# Patient Record
Sex: Male | Born: 1950 | Race: White | Hispanic: No | Marital: Married | Smoking: Former smoker
Health system: Southern US, Community
[De-identification: ages and names within clinical notes are randomized; demographics above are authoritative.]

## PROBLEM LIST (undated history)

## (undated) DIAGNOSIS — E7800 Pure hypercholesterolemia, unspecified: Secondary | ICD-10-CM

## (undated) DIAGNOSIS — K219 Gastro-esophageal reflux disease without esophagitis: Secondary | ICD-10-CM

## (undated) DIAGNOSIS — I2510 Atherosclerotic heart disease of native coronary artery without angina pectoris: Secondary | ICD-10-CM

## (undated) DIAGNOSIS — M109 Gout, unspecified: Secondary | ICD-10-CM

## (undated) DIAGNOSIS — E119 Type 2 diabetes mellitus without complications: Secondary | ICD-10-CM

## (undated) DIAGNOSIS — N289 Disorder of kidney and ureter, unspecified: Secondary | ICD-10-CM

## (undated) DIAGNOSIS — M1990 Unspecified osteoarthritis, unspecified site: Secondary | ICD-10-CM

## (undated) DIAGNOSIS — I219 Acute myocardial infarction, unspecified: Secondary | ICD-10-CM

## (undated) DIAGNOSIS — I10 Essential (primary) hypertension: Secondary | ICD-10-CM

## (undated) HISTORY — PX: HERNIA REPAIR: SHX51

## (undated) HISTORY — PX: CORONARY STENT PLACEMENT: SHX1402

---

## 2001-10-14 ENCOUNTER — Encounter: Payer: Self-pay | Admitting: Internal Medicine

## 2001-10-14 ENCOUNTER — Ambulatory Visit (HOSPITAL_COMMUNITY): Admission: RE | Admit: 2001-10-14 | Discharge: 2001-10-14 | Payer: Self-pay | Admitting: Internal Medicine

## 2006-04-06 ENCOUNTER — Emergency Department (HOSPITAL_COMMUNITY): Admission: EM | Admit: 2006-04-06 | Discharge: 2006-04-06 | Payer: Self-pay | Admitting: Emergency Medicine

## 2006-07-26 ENCOUNTER — Emergency Department (HOSPITAL_COMMUNITY): Admission: EM | Admit: 2006-07-26 | Discharge: 2006-07-26 | Payer: Self-pay | Admitting: Emergency Medicine

## 2006-12-05 ENCOUNTER — Ambulatory Visit (HOSPITAL_COMMUNITY): Admission: RE | Admit: 2006-12-05 | Discharge: 2006-12-05 | Payer: Self-pay | Admitting: General Surgery

## 2006-12-05 ENCOUNTER — Encounter (INDEPENDENT_AMBULATORY_CARE_PROVIDER_SITE_OTHER): Payer: Self-pay | Admitting: General Surgery

## 2007-10-20 ENCOUNTER — Ambulatory Visit (HOSPITAL_COMMUNITY): Admission: RE | Admit: 2007-10-20 | Discharge: 2007-10-20 | Payer: Self-pay | Admitting: Family Medicine

## 2008-05-31 ENCOUNTER — Ambulatory Visit (HOSPITAL_COMMUNITY): Admission: RE | Admit: 2008-05-31 | Discharge: 2008-05-31 | Payer: Self-pay | Admitting: Cardiology

## 2008-06-03 ENCOUNTER — Inpatient Hospital Stay (HOSPITAL_COMMUNITY): Admission: RE | Admit: 2008-06-03 | Discharge: 2008-06-05 | Payer: Self-pay | Admitting: Cardiology

## 2008-06-29 ENCOUNTER — Inpatient Hospital Stay (HOSPITAL_COMMUNITY): Admission: RE | Admit: 2008-06-29 | Discharge: 2008-07-01 | Payer: Self-pay | Admitting: Cardiology

## 2010-05-30 LAB — BASIC METABOLIC PANEL
BUN: 15 mg/dL (ref 6–23)
CO2: 25 mEq/L (ref 19–32)
Calcium: 8.7 mg/dL (ref 8.4–10.5)
Chloride: 107 mEq/L (ref 96–112)
Creatinine, Ser: 1.32 mg/dL (ref 0.4–1.5)
GFR calc Af Amer: >60 mL/min (ref >60)
GFR calc non Af Amer: 56 mL/min — ABNORMAL LOW (ref >60)
Glucose, Bld: 175 mg/dL — ABNORMAL HIGH (ref 70–99)
Potassium: 3.8 mEq/L (ref 3.5–5.1)
Sodium: 139 mEq/L (ref 135–145)

## 2010-05-30 LAB — CBC
Hemoglobin: 13.8 g/dL (ref 13.0–17.0)
RBC: 4.43 MIL/uL (ref 4.22–5.81)
RDW: 13.8 % (ref 11.5–15.5)
WBC: 4.7 10*3/uL (ref 4.0–10.5)

## 2010-05-30 LAB — CARDIAC PANEL(CRET KIN+CKTOT+MB+TROPI)
CK, MB: 10.5 ng/mL — ABNORMAL HIGH (ref 0.3–4.0)
CK, MB: 9.2 ng/mL — ABNORMAL HIGH (ref 0.3–4.0)
Relative Index: 3.2 — ABNORMAL HIGH (ref 0.0–2.5)
Relative Index: 3.6 — ABNORMAL HIGH (ref 0.0–2.5)
Total CK: 248 U/L — ABNORMAL HIGH (ref 7–232)
Total CK: 253 U/L — ABNORMAL HIGH (ref 7–232)
Total CK: 333 U/L — ABNORMAL HIGH (ref 7–232)
Troponin I: 0.05 ng/mL (ref 0.00–0.06)

## 2010-05-30 LAB — TESTOSTERONE: Testosterone: 242.19 ng/dL — ABNORMAL LOW (ref 350–890)

## 2010-05-31 LAB — CARDIAC PANEL(CRET KIN+CKTOT+MB+TROPI)
CK, MB: 11.2 ng/mL — ABNORMAL HIGH (ref 0.3–4.0)
CK, MB: 13.8 ng/mL — ABNORMAL HIGH (ref 0.3–4.0)
Relative Index: 3 — ABNORMAL HIGH (ref 0.0–2.5)
Relative Index: 2.7 — ABNORMAL HIGH (ref 0.0–2.5)
Total CK: 403 U/L — ABNORMAL HIGH (ref 7–232)
Total CK: 426 U/L — ABNORMAL HIGH (ref 7–232)
Troponin I: 0.08 ng/mL — ABNORMAL HIGH (ref 0.00–0.06)

## 2010-05-31 LAB — BASIC METABOLIC PANEL
BUN: 16 mg/dL (ref 6–23)
BUN: 17 mg/dL (ref 6–23)
CO2: 26 mEq/L (ref 19–32)
CO2: 27 mEq/L (ref 19–32)
Calcium: 8.6 mg/dL (ref 8.4–10.5)
Calcium: 9.2 mg/dL (ref 8.4–10.5)
Creatinine, Ser: 1.23 mg/dL (ref 0.4–1.5)
Glucose, Bld: 107 mg/dL — ABNORMAL HIGH (ref 70–99)
Glucose, Bld: 127 mg/dL — ABNORMAL HIGH (ref 70–99)
Sodium: 138 mEq/L (ref 135–145)

## 2010-05-31 LAB — CBC
Hemoglobin: 14 g/dL (ref 13.0–17.0)
MCHC: 34.7 g/dL (ref 30.0–36.0)
RBC: 4.57 MIL/uL (ref 4.22–5.81)

## 2010-07-04 NOTE — Discharge Summary (Signed)
NAMEKERRI, BHATTI NO.:  [id number]   MEDICAL RECORD NO.:  [id number]          PATIENT TYPE:  INP   LOCATION:  2501                         FACILITY:  [facility]   PHYSICIAN:  Eden Lathe. Einar Gip, MD       DATE OF BIRTH:  26-Sep-1950   DATE OF ADMISSION:  06/03/2008  DATE OF DISCHARGE:  06/05/2008                               DISCHARGE SUMMARY   DISCHARGE DIAGNOSES:  1. Angina with multiple risk factors for coronary artery disease.  2. Coronary artery disease with multivessel stenosis including 99%      right coronary artery stenosis.  The patient underwent percutaneous      transluminal coronary angioplasty and drug-eluting stent x2 to the      right coronary artery, residual disease of 80% circumflex with      plans for intervention in the near future.  3. Normal ejection fraction.  4. Nonsustained ventricular tachycardia.  5. Mild elevation of enzymes after stent placed.  6. Positive tobacco use, counseled on stopping.  7. Tobacco chewing.   DISCHARGE CONDITION:  Improved.   PROCEDURES:  1. Combined left heart catheterization on June 03, 2008, by Dr. Adrian Prows.  2. On June 03, 2008, percutaneous transluminal coronary angioplasty      and stent deployment x2 stents in the right coronary artery by Dr.      Einar Gip reducing 99% stenosis to 0.   Plan to return to Cath Lab in the near future for intervention to the  circumflex.  It was attempted during this hospitalization, but stopped  secondary to back pain of the patient.   DISCHARGE CONDITION:  Improved.   DISCHARGE MEDICATIONS:  1. Lisinopril 10 mg daily.  2. Stop omeprazole, use Protonix 40 mg 1 daily at noon.  3. Simvastatin 40 mg daily.  4. Amlodipine 5 mg daily.  5. Take aspirin, enteric coated, 325 mg daily.  6. Plavix 75 mg 1 daily, do not stop, stopping could cause a heart      attack.  7. Lopressor 25 mg 1 twice a day.  8. Nitroglycerin 1/150 one under tongue as needed for chest pain while     sitting, 1 every 5 minutes to 3 every 15 minutes.  If pain      continues, go to the emergency room.   DISCHARGE INSTRUCTIONS:  1. Low-sodium, heart-healthy diet.  2. Wash cath site with soap and water.  Call if any bleeding,      swelling, or drainage.  Increase activity slowly.  May shower.  No      lifting for 2 days.  No driving for 2 days.  3. Follow up with Dr. Einar Gip on June 15, 2008, 11:30 a.m.   HISTORY OF PRESENT ILLNESS:  A 60 year old gentleman with long 25-pack  history of tobacco use, currently chewing tobacco, was having increasing  shortness of breath and dyspnea on exertion over 2-3 months with  exertional activities.  He also noted heaviness in the left upper chest  and he presented to see Dr. Einar Gip for followup.  He does have  occasional  gastroesophageal reflux disease with associated palpitations, shortness  of breath, and back pain occasionally.   ALLERGIES:  No known drug allergies.   FAMILY HISTORY, SOCIAL HISTORY, AND REVIEW OF SYSTEMS:  See H&P.   PHYSICAL EXAMINATION ON DISCHARGE:  VITAL SIGNS:  Blood pressure 139/87,  pulse 67, respirations 17, temperature 99.1, and oxygen saturation on  room air 97%.  HEART:  S1 and S2.  Regular rate and rhythm.  LUNGS:  Clear to auscultation.  ABDOMEN:  Soft and nontender.  Positive bowel sounds.  EXTREMITIES:  Right groin without hematoma.  Pulses are present.   LABORATORY DATA:  Sodium 140, potassium 3.8, BUN 17, creatinine 1.23,  and glucose 107.  Cardiac enzymes, initial postprocedure was 379 with MB  11.2 and troponin of 0.11, followup was 403 with MB of 13.8 and troponin  of 0.13, and third CK-MB was 426 with MB of 15.6 and troponin of 0.08.   HOSPITAL COURSE:  The patient underwent procedure and intervention to  his coronary artery disease electively as an outpatient, stayed  overnight and plans were for discharge home the next day after tobacco  cessation consult was done which it was as well as rehab but  with mild  elevation of CK-MB and troponin and some nonsustained ventricular  tachycardia 45 beats.  It was felt he should be kept 1 more night which  we did do.  Additionally, he was started on beta-blocker and Lopressor  25 b.i.d.  He will follow up with Dr. Einar Gip as an outpatient.  By the  morning of June 05, 2008, he was stable without complaints, ambulating  without problems and no further ventricular tachycardia.  He was seen by  Dr. Elisabeth Cara and discharged home and will follow up with Dr. Einar Gip.      Otilio Carpen. Ingold, N.P.      Eden Lathe. Einar Gip, MD  Electronically Signed    LRI/MEDQ  D:  06/05/2008  T:  06/06/2008  Job:  [id number]   cc:   Bonne Dolores, M.D.

## 2010-07-04 NOTE — Cardiovascular Report (Signed)
NAMERIDGELY, FAWCETT NO.:  [id number]   MEDICAL RECORD NO.:  [id number]          PATIENT TYPE:  INP   LOCATION:  2501                         FACILITY:  [facility]   PHYSICIAN:  Eden Lathe. Einar Gip, MD       DATE OF BIRTH:  Feb 03, 1951   DATE OF PROCEDURE:  06/03/2008  DATE OF DISCHARGE:                            CARDIAC CATHETERIZATION   PROCEDURE PERFORMED:  1. Left heart catheterization including:      a.     Left ventriculography.      b.     Selective right and left coronary arteriography.      c.     PTCA and stenting of the right coronary artery which is very       complex.      d.     Attempted angioplasty of the circumflex coronary artery.   INDICATIONS:  Mr. Mayer Lakatos is a 60 year old gentleman with  hypertension and hyperlipidemia, who has been having severe chest  discomfort.  He had undergone a stress test which had revealed severe  inferior wall ischemia.  Given the fact that he was already on adequate  medical therapy, he is now brought to the cardiac catheterization lab to  evaluate his coronary anatomy.   HEMODYNAMIC DATA:  The left ventricular pressure was 128/0 with end  diastolic pressure of 6 mmHg.  Aortic pressure was 128/78 with a mean of  96 mmHg.  There is no significant pressure gradient across the aortic  valve.   ANGIOGRAPHIC DATA:  1. Left ventricle:  Left ventricular systolic function was normal with      ejection fraction of 60%.  There was no significant mitral      regurgitation.  2. Right coronary artery:  Right coronary artery is a moderate-caliber      vessel and is codominant with the circumflex coronary artery.  The      right coronary artery from the mid segment has diffuse long segment      99% stenoses.  There were no collaterals to this right coronary      artery.  3. Left main coronary artery:  Left main coronary artery is a large-      caliber vessel, immediately bifurcates.  4. Circumflex.  The circumflex coronary  artery is codominant with      right coronary artery.  It is tortuous and gives origin to large      obtuse marginal-1 which has an acute angle takeoff with a proximal      60-70% calcific stenosis.  Ostium also has a 30-40% stenosis.  The      circumflex coronary artery right after the bifurcation of the      obtuse marginal-1 has a calcified 80% stenosis.  Then, it gives      origin to a obtuse marginal-2and and between obtuse marginal-1 and      obtuse marginal-2 the circumflex coronary artery is again tortuous      with a 30-40% calcific stenosis followed by an origin of a moderate-      to-large sized obtuse marginal-2 which  has a mid 40% stenosis, and      the circumflex artery in the mid segment after the origin of the      obtuse marginal-2 has a calcific 80% stenoses.  The distal right      coronary artery has mild luminal irregularity.  5. LAD.  LAD gives rise to a large diagonal.  It has got mild luminal      irregularity.   INTERVENTION DATA:  Successful PTCA and stenting of the mid and proximal  right coronary artery with implantation of a 2.5- x 30-mm Endeavor stent  and overlapped proximally with a 2.75- x 15-mm XIENCE stent.  These  stents were deployed at 16 atmospheric pressure and throughout the  stented segment a 3.0- x 12-mm Voyager [place] balloon was utilized and  multiple balloon inflations at 16 atmospheric pressure was performed.  Overall, the stenosis was reduced from 99% to 0% with brisk TIMI II to  TIMI III flow maintained at the end of the procedure.   At the tightest segment at the origin of the right ventricular branch at  the right coronary artery which had a calcific 99% stenosis, she had a  very minimal residual waist.  However, this lesion was left alone with  the fear of perforation.   I attempted to perform angioplasty of the codominant circumflex coronary  artery, but because of the patient's back pain, prolonged procedure, and  tortuosity of the  vessel although with  great amount of difficulty I was  able to advance old used 3.0- x 12-mm noncompliant balloon into the  proximal lesion but I was not able to advance it into the distal lesion  because of tortuosity, and the wire had prolapsed, hence I decided to  abandon the procedure and do this procedure on an elective basis.   DISCUSSION:  Although the patient could have certainly had coronary  bypass grafting, the fact was that the circumflex coronary artery had  bypass to be done to obtuse marginal-1, obtuse marginal-2, and distal  circumflex artery.  Probably, a vein graft needed to be utilized and  right coronary artery could have been bypassed with the LAD being left  alone.  However, I felt confident that after fixing the right coronary  artery the circumflex coronary artery high-grade calcific lesions could  be easily stented with a large vessel stent which would give adequate  revascularization results.  Hence, weighing the risks and benefits of  bypass surgery versus angioplasty including perioperative events, I  proceeded with angioplasty.  The patient will be now brought back for  repeat angiography and angioplasty of the circumflex coronary artery in  2-3 weeks' time depending on how he does.   A total of 250 mL of contrast was utilized for diagnostic and  interventional procedure.   TECHNIQUE OF THE PROCEDURE:  After usual sterile precautions using a 6-  French right femoral artery access, 6-French multipurpose B2 wire  catheter was advanced into the ascending aorta and then to the left  ventricle.  Left ventriculography was performed both in the LAO and RAO  projections.  Catheter pulled into the ascending aorta.  Right coronary  artery was selectively engaged, and angiography was performed.  Then,  the left ventricle was selectively engaged.  Angiography was performed  and catheter was pulled out of the body.   Using 7-French sheath and using Angiomax for  anticoagulation, a 6-French  FR-4 guide was utilized to engage the right coronary artery and the  procedure was completed using a ATW guidewire.  Lesion length was  carefully measured.  A 2.0- x 10-mm AngioSculpt balloon was utilized,  but I had great difficulty in advancing the AngioSculpt at the tightest  segment in the mid segment of the right coronary artery.  Having  performed scoring angioplasty at 8 to 10 atmof pressure in the proximal  segment, I decided to proceed with utilization of a Voyager [place] 2.5- x 25-  mm balloon and multiple inflations from 6-14 atmospheric pressures were  performed throughout the mid segment of the right coronary artery.  Having performed this, I was able to established TIMI III flow; however,  there was still significant dissection noted and I decided to stent this  with 2.5- x 30-mm Endeavor and overlapped this with a 2.75- x 15-mm  XIENCE stent which were also deployed at high pressure of 18 and  postdilated the same stent balloon.  The 2.75- x 15-mm stent balloon at  18 atmospheric pressure throughout the stented segment.  Because of  presence of persistent waste in the mid segment, a 3.0 x 12-mm Voyager  [place] balloon was utilized and multiple inflations were performed at the  highest stenotic segment.  During the procedure, intracoronary  nitroglycerin was also administered.  Then, the guidewires were  withdrawn .  Guide catheter was pulled out of the body.   Using a 7-French FL 4.5 guide, the left main coronary artery was  selectively engaged.  I used a Ecologist to advance into the  circumflex coronary artery and with great difficulty I was able to  advance into the circumflex artery.  Initially, I also utilized a 3.0- x  12-mm Voyager [place] balloon for balloon support for advancing the wire.  I  had great difficulty in advancing this used balloon into the circumflex  coronary artery and having difficulty with  back-up support and  the patient's  back pain, I decided to abandon the  procedure.  The guidewire was withdrawn.  Guide catheter disengaged and  pulled out of the body.  Overall, the patient tolerated the procedure.  There was no immediate complication noted.  The patient was chest pain  free during the entire procedure.       Eden Lathe. Einar Gip, MD  Electronically Signed     JRG/MEDQ  D:  06/03/2008  T:  06/04/2008  Job:  [id number]   cc:   Bonne Dolores, M.D.

## 2010-07-04 NOTE — H&P (Signed)
NAME:  Gabriel Schultz, Gabriel Schultz               ACCOUNT NO.:  [id number]   MEDICAL RECORD NO.:  [id number]          PATIENT TYPE:  AMB   LOCATION:  DAY                           FACILITY:  [facility]   PHYSICIAN:  Jamesetta So, M.D.  DATE OF BIRTH:  [birth year]   DATE OF ADMISSION:  12/04/2006  DATE OF DISCHARGE:  LH                              HISTORY & PHYSICAL   CHIEF COMPLAINT:  Need for screening colonoscopy.   HISTORY OF PRESENT ILLNESS:  The patient is a 60 year old white male who  was referred for endoscopic evaluation.  Needs a colonoscopy for  screening purposes.  No abdominal pain, weight loss, nausea, vomiting,  diarrhea, constipation, melena or hematochezia have been noted.  He has  never had a colonoscopy.  There is no family history of colon carcinoma.   PAST MEDICAL HISTORY:  Unremarkable.   PAST SURGICAL HISTORY:  Unremarkable.   CURRENT MEDICATIONS:  None.   ALLERGIES:  No known drug allergies.   REVIEW OF SYSTEMS:  The patient denies drinking or smoking.  He denies  any cardiopulmonary difficulties or bleeding disorders.   PHYSICAL EXAMINATION:  GENERAL:  The patient is a well-developed, well-  nourished white male in no acute distress.  LUNGS:  Clear to auscultation with equal breath sounds bilaterally.  HEART EXAMINATION:  Reveals a regular rate and rhythm without S3, S4,  murmurs.  ABDOMEN:  Soft, nontender, nondistended. No hepatosplenomegaly or masses  are noted.  RECTAL EXAM:  Deferred to the procedure.   IMPRESSION:  Need for screening colonoscopy.   PLAN:  The patient is scheduled for colonoscopy on December 04, 2006.  The risks and benefits of the procedure including bleeding and  perforation were fully explained to the patient, he gave informed  consent.      Jamesetta So, M.D.  Electronically Signed     MAJ/MEDQ  D:  11/19/2006  T:  11/19/2006  Job:  [id number]   cc:   Leonides Grills, M.D.  Fax: [phone]

## 2010-07-04 NOTE — Discharge Summary (Signed)
Gabriel Schultz, Gabriel Schultz NO.:  [id number]   MEDICAL RECORD NO.:  [id number]          PATIENT TYPE:  INP   LOCATION:  2502                         FACILITY:  [facility]   PHYSICIAN:  Eden Lathe. Einar Gip, MD       DATE OF BIRTH:  05-21-1950   DATE OF ADMISSION:  06/29/2008  DATE OF DISCHARGE:  07/01/2008                               DISCHARGE SUMMARY   DISCHARGE DIAGNOSES:  1. Coronary disease, elective circumflex intervention and stenting      this admission with CK peak of 333 periprocedure.  2. Previous right coronary artery stenting, June 03, 2008, which was      patent.  3. Treated dyslipidemia.  4. Treated hypertension.  5. History of smoking.  6. Bilateral hip pain, Dopplers to be obtained as an outpatient.   HOSPITAL COURSE:  The patient is a 60 year old male, followed by Dr.  Einar Gip.  He has a history of coronary disease and underwent RCA stenting  with XIENCE stents on June 03, 2008.  He had residual high-grade  circumflex disease.  He presented for followup on June 15, 2008.  Dr.  Einar Gip admitted him for an elective intervention to the circumflex on Jun 29, 2008.  Angiogram showed patent RCA stents, patent LAD, 40-50% OM-1,  and high-grade proximal 90% circumflex stenosis.  This was treated with  a Multi-Link stent.  Post procedure, his CKs did go to 333 with 10 MBs.  The patient was kept overnight.  He did have significant bradycardia  during his hospitalization, and we held his beta-blocker.  I will send  him home on a decreased dose.  He will follow up with Dr. Einar Gip in a  couple of weeks.   DISCHARGE MEDICATIONS:  1. Protonix 40 mg a day.  2. Metoprolol 12.5 mg twice a day.  3. Plavix 75 mg a day.  4. Lisinopril/hydrochlorothiazide 20/12.5 daily.  5. Simvastatin 40 mg a day.  6. Amlodipine 10 mg a day.  7. Nitroglycerin sublingual p.r.n.  8. Aspirin 325 mg a day.  9. Plavix 75 mg a day.   LABORATORY DATA:  A testosterone level was obtained and is 242.   White  count 4.7, hemoglobin 13.8, hematocrit 39.2, platelets 149.  Sodium 139,  potassium 3.8, BUN 15, creatinine 1.32.   EKG shows sinus rhythm, sinus bradycardia.   DISPOSITION:  The patient is discharged in stable condition.  He will  follow up with Dr. Einar Gip in a couple of weeks.  He has been instructed  to not to take his metoprolol when his heart rate is less than 55.      Erlene Quan, P.A.      Eden Lathe. Einar Gip, MD  Electronically Signed    LKK/MEDQ  D:  07/01/2008  T:  07/02/2008  Job:  [id number]   cc:   Bonne Dolores, M.D.

## 2010-07-04 NOTE — Cardiovascular Report (Signed)
Gabriel Schultz, Gabriel Schultz NO.:  [id number]   MEDICAL RECORD NO.:  [id number]          PATIENT TYPE:  INP   LOCATION:  2502                         FACILITY:  [facility]   PHYSICIAN:  Eden Lathe. Einar Gip, MD       DATE OF BIRTH:  [birth year]   DATE OF PROCEDURE:  DATE OF DISCHARGE:                            CARDIAC CATHETERIZATION   PROCEDURE PERFORMED:  PTCA and stenting of the mid circumflex coronary  artery.   INDICATIONS:  Mr. Gabriel Schultz is a 60 year old gentleman with known  coronary artery disease.  He has undergone successful PTCA and stenting  of a high-grade mid RCA with implantation of a drug-eluting 2.75- x 15-  and a 2.5- x 30-mm XIENCE and Endeavor stents respectively on June 03, 2008, and was found to have a high-grade calcific tandem 80-90% stenosis  of the mid circumflex coronary artery which was codominant to the right  coronary artery.  Given high-grade nature and a large area of  distribution of coronary arterial system, he was brought to the cardiac  cath lab for elective angioplasty of the circumflex coronary artery.   ANGIOGRAPHIC DATA:  1. Left main coronary artery:  Left main coronary artery is short and      immediately bifurcates.  2. Circumflex coronary artery:  Circumflex coronary artery gives      origin to large obtuse marginal-1.  It has a calcific 40-50%      stenosis in the proximal to mid segment..  The circumflex itself      after the origin of the obtuse marginal-1 has a high-grade calcific      80-90% stenoses.  There is a tandem 40-50% stenosis after the      origin of a moderate-to-large sized obtuse marginal-2.  The      circumflex distally gives origin to small PDA branches.   INTERVENTION DATA:  Successful PTCA and stenting of the mid circumflex  coronary artery with implantation of a 4.0- x 18-mm Vision stent  deployed at 10 atmospheric pressure.  This stent was postdilated  throughout the entire stented segment with a 4.0- x  12-mm  Apex  balloon at 16 atmospheric pressure.  Overall, the stenosis was reduced  from 90% to 0% with brisk TIMI III to TIMI III flow maintained at the  end of the procedure.  Distal circumflex coronary artery stenosis which  initially was felt to be 70-80% appeared to be at most 40-50%, hence was  left alone.   A total of 115 mL of contrast was utilized for diagnostic and  interventional procedures.   RECOMMENDATIONS:  The patient will be continued on Plavix long term  given the fact that he has a long drug-eluting stent to the right  coronary artery.  He will be discharged home in the morning if he  remains stable.   TECHNIQUE OF THE PROCEDURE:  Under usual sterile precautions using a 7-  French right femoral artery access, a 7-French AL2 guide catheter was  utilized to engage left main coronary artery.  Using [facility] 0.014th of  an inch and 190-cm guidewire,  I was able to cross through the circumflex  coronary artery, and the wire tip was carefully placed in the distal  circumflex coronary artery.  I also utilized a [employer] soft into the  obtuse marginal-1 branch to protect the side branch.  I also wanted to  confirm the severity of stenosis of the obtuse marginal branch.  Intracoronary nitroglycerin was administered, and angiography was  performed followed by predilatation with a 3.0- x 15-mm Apex balloon at  around 14 atmospheric pressure.  Multiple inflations were performed.  Following this, a 4.0- x 18-mm Vision stent was deployed saving the side  branch with excellent position.  Deployed at 10 atmospheric pressure,  postdilated with a noncompliant 4-mm balloon at 16 atmospheric pressure.  Following this, intracoronary  glycerin was administered, and angiography was performed.  Excellent  results were noted.  The guidewire was withdrawn.  Angiography repeated.  Guide catheter pulled out of the body in the usual fashion.  The patient  tolerated the procedure, no immediate  complication.      Eden Lathe. Einar Gip, MD  Electronically Signed     JRG/MEDQ  D:  06/29/2008  T:  06/29/2008  Job:  [id number]   cc:   Bonne Dolores, M.D.

## 2010-07-04 NOTE — H&P (Signed)
NAME:  Gabriel Schultz, Gabriel Schultz               ACCOUNT NO.:  [id number]   MEDICAL RECORD NO.:  [id number]          PATIENT TYPE:  AMB   LOCATION:  DAY                           FACILITY:  [facility]   PHYSICIAN:  Jamesetta So, M.D.  DATE OF BIRTH:  1950/12/26   DATE OF ADMISSION:  12/04/2006  DATE OF DISCHARGE:  LH                              HISTORY & PHYSICAL   CHIEF COMPLAINT:  Need for screening colonoscopy.   HISTORY OF PRESENT ILLNESS:  The patient is a 60 year old white male who  was referred for endoscopic evaluation.  Needs a colonoscopy for  screening purposes.  No abdominal pain, weight loss, nausea, vomiting,  diarrhea, constipation, melena or hematochezia have been noted.  He has  never had a colonoscopy.  There is no family history of colon carcinoma.   PAST MEDICAL HISTORY:  Unremarkable.   PAST SURGICAL HISTORY:  Unremarkable.   CURRENT MEDICATIONS:  None.   ALLERGIES:  No known drug allergies.   REVIEW OF SYSTEMS:  The patient denies drinking or smoking.  He denies  any cardiopulmonary difficulties or bleeding disorders.   PHYSICAL EXAMINATION:  GENERAL:  The patient is a well-developed, well-  nourished white male in no acute distress.  LUNGS:  Clear to auscultation with equal breath sounds bilaterally.  HEART EXAMINATION:  Reveals a regular rate and rhythm without S3, S4,  murmurs.  ABDOMEN:  Soft, nontender, nondistended. No hepatosplenomegaly or masses  are noted.  RECTAL EXAM:  Deferred to the procedure.   IMPRESSION:  Need for screening colonoscopy.   PLAN:  The patient is scheduled for colonoscopy on December 04, 2006.  The risks and benefits of the procedure including bleeding and  perforation were fully explained to the patient, he gave informed  consent.      Jamesetta So, M.D.  Electronically Signed     MAJ/MEDQ  D:  11/19/2006  T:  11/19/2006  Job:  [id number]   cc:   Leonides Grills, M.D.  Fax: [phone]

## 2010-12-07 LAB — DIFFERENTIAL
Eosinophils Relative: 0
Lymphocytes Relative: 15
Monocytes Absolute: 1 — ABNORMAL HIGH
Monocytes Relative: 8
Neutro Abs: 9.8 — ABNORMAL HIGH

## 2010-12-07 LAB — URINE MICROSCOPIC-ADD ON

## 2010-12-07 LAB — URINALYSIS, ROUTINE W REFLEX MICROSCOPIC
Specific Gravity, Urine: >1.03 — ABNORMAL HIGH
pH: 5

## 2010-12-07 LAB — BASIC METABOLIC PANEL
CO2: 24
Calcium: 9.4
GFR calc Af Amer: 26 — ABNORMAL LOW
GFR calc non Af Amer: 21 — ABNORMAL LOW
Glucose, Bld: 118 — ABNORMAL HIGH
Potassium: 4.4
Sodium: 139

## 2010-12-07 LAB — CBC
HCT: 46.4
Hemoglobin: 16.4
MCHC: 35.3
RBC: 5.3

## 2014-02-23 DIAGNOSIS — Z9862 Peripheral vascular angioplasty status: Secondary | ICD-10-CM | POA: Insufficient documentation

## 2014-03-06 ENCOUNTER — Encounter (HOSPITAL_COMMUNITY): Payer: Self-pay | Admitting: Emergency Medicine

## 2014-03-06 ENCOUNTER — Emergency Department (HOSPITAL_COMMUNITY): Payer: 59

## 2014-03-06 ENCOUNTER — Emergency Department (HOSPITAL_COMMUNITY)
Admission: EM | Admit: 2014-03-06 | Discharge: 2014-03-06 | Disposition: A | Discharge status: Home / Self Care | Payer: 59 | Attending: Emergency Medicine | Admitting: Emergency Medicine

## 2014-03-06 DIAGNOSIS — R05 Cough: Secondary | ICD-10-CM | POA: Diagnosis present

## 2014-03-06 DIAGNOSIS — J209 Acute bronchitis, unspecified: Secondary | ICD-10-CM | POA: Diagnosis not present

## 2014-03-06 DIAGNOSIS — Z8739 Personal history of other diseases of the musculoskeletal system and connective tissue: Secondary | ICD-10-CM | POA: Diagnosis not present

## 2014-03-06 DIAGNOSIS — Z8719 Personal history of other diseases of the digestive system: Secondary | ICD-10-CM | POA: Insufficient documentation

## 2014-03-06 DIAGNOSIS — J40 Bronchitis, not specified as acute or chronic: Secondary | ICD-10-CM

## 2014-03-06 DIAGNOSIS — J069 Acute upper respiratory infection, unspecified: Secondary | ICD-10-CM

## 2014-03-06 DIAGNOSIS — Z87891 Personal history of nicotine dependence: Secondary | ICD-10-CM | POA: Diagnosis not present

## 2014-03-06 DIAGNOSIS — Z8639 Personal history of other endocrine, nutritional and metabolic disease: Secondary | ICD-10-CM | POA: Insufficient documentation

## 2014-03-06 DIAGNOSIS — I10 Essential (primary) hypertension: Secondary | ICD-10-CM | POA: Diagnosis not present

## 2014-03-06 HISTORY — DX: Gout, unspecified: M10.9

## 2014-03-06 HISTORY — DX: Pure hypercholesterolemia, unspecified: E78.00

## 2014-03-06 HISTORY — DX: Gastro-esophageal reflux disease without esophagitis: K21.9

## 2014-03-06 HISTORY — DX: Essential (primary) hypertension: I10

## 2014-03-06 LAB — CBC
HCT: 42.5 % (ref 39.0–52.0)
Hemoglobin: 14.2 g/dL (ref 13.0–17.0)
MCH: 30 pg (ref 26.0–34.0)
MCHC: 33.4 g/dL (ref 30.0–36.0)
MCV: 89.7 fL (ref 78.0–100.0)
Platelets: 190 10*3/uL (ref 150–400)
RBC: 4.74 MIL/uL (ref 4.22–5.81)
RDW: 13.7 % (ref 11.5–15.5)
WBC: 6.7 10*3/uL (ref 4.0–10.5)

## 2014-03-06 LAB — BASIC METABOLIC PANEL
Anion gap: 8 (ref 5–15)
BUN: 16 mg/dL (ref 6–23)
CALCIUM: 8.7 mg/dL (ref 8.4–10.5)
CHLORIDE: 101 meq/L (ref 96–112)
CO2: 24 mmol/L (ref 19–32)
Creatinine, Ser: 1.4 mg/dL — ABNORMAL HIGH (ref 0.50–1.35)
GFR calc non Af Amer: 52 mL/min — ABNORMAL LOW (ref >90)
GFR, EST AFRICAN AMERICAN: 60 mL/min — AB (ref >90)
GLUCOSE: 145 mg/dL — AB (ref 70–99)
Potassium: 4.1 mmol/L (ref 3.5–5.1)
SODIUM: 133 mmol/L — AB (ref 135–145)

## 2014-03-06 LAB — TROPONIN I

## 2014-03-06 MED ORDER — ALBUTEROL SULFATE (2.5 MG/3ML) 0.083% IN NEBU
5.0000 mg | INHALATION_SOLUTION | Freq: Once | RESPIRATORY_TRACT | Status: AC
  Administered 2014-03-06: 5 mg via RESPIRATORY_TRACT
  Filled 2014-03-06: qty 6

## 2014-03-06 MED ORDER — ALBUTEROL SULFATE HFA 108 (90 BASE) MCG/ACT IN AERS
2.0000 | INHALATION_SPRAY | Freq: Once | RESPIRATORY_TRACT | Status: AC
  Administered 2014-03-06: 2 via RESPIRATORY_TRACT
  Filled 2014-03-06: qty 6.7

## 2014-03-06 MED ORDER — AZITHROMYCIN 250 MG PO TABS: 250.0000 mg | ORAL_TABLET | Freq: Every day | ORAL | Status: DC

## 2014-03-06 NOTE — ED Notes (Signed)
RT at bedside.

## 2014-03-06 NOTE — Discharge Instructions (Signed)
Please call your doctor for a followup appointment within 24-48 hours. When you talk to your doctor please let them know that you were seen in the emergency department and have them acquire all of your records so that they can discuss the findings with you and formulate a treatment plan to fully care for your new and ongoing problems. Please call and set-up an appointment with your primary care provider to be seen and assessed this week - please discuss regarding blood pressure medications and a re-check of your creatinine level  Please rest and stay hydrated Please take antibiotics on a full stomach  Please continue to monitor symptoms closely and if symptoms are to worsen or change (fever greater than 101, chills, sweating, nausea, vomiting, chest pain, shortness of breathe, difficulty breathing, weakness, numbness, tingling, worsening or changes to pain pattern, coughing up blood, chest tightness, increased wheezing) please report back to the Emergency Department immediately.   Upper Respiratory Infection, Adult An upper respiratory infection (URI) is also sometimes known as the common cold. The upper respiratory tract includes the nose, sinuses, throat, trachea, and bronchi. Bronchi are the airways leading to the lungs. Most people improve within 1 week, but symptoms can last up to 2 weeks. A residual cough may last even longer.  CAUSES Many different viruses can infect the tissues lining the upper respiratory tract. The tissues become irritated and inflamed and often become very moist. Mucus production is also common. A cold is contagious. You can easily spread the virus to others by oral contact. This includes kissing, sharing a glass, coughing, or sneezing. Touching your mouth or nose and then touching a surface, which is then touched by another person, can also spread the virus. SYMPTOMS  Symptoms typically develop 1 to 3 days after you come in contact with a cold virus. Symptoms vary from person  to person. They may include:  Runny nose.  Sneezing.  Nasal congestion.  Sinus irritation.  Sore throat.  Loss of voice (laryngitis).  Cough.  Fatigue.  Muscle aches.  Loss of appetite.  Headache.  Low-grade fever. DIAGNOSIS  You might diagnose your own cold based on familiar symptoms, since most people get a cold 2 to 3 times a year. Your caregiver can confirm this based on your exam. Most importantly, your caregiver can check that your symptoms are not due to another disease such as strep throat, sinusitis, pneumonia, asthma, or epiglottitis. Blood tests, throat tests, and X-rays are not necessary to diagnose a common cold, but they may sometimes be helpful in excluding other more serious diseases. Your caregiver will decide if any further tests are required. RISKS AND COMPLICATIONS  You may be at risk for a more severe case of the common cold if you smoke cigarettes, have chronic heart disease (such as heart failure) or lung disease (such as asthma), or if you have a weakened immune system. The very young and very old are also at risk for more serious infections. Bacterial sinusitis, middle ear infections, and bacterial pneumonia can complicate the common cold. The common cold can worsen asthma and chronic obstructive pulmonary disease (COPD). Sometimes, these complications can require emergency medical care and may be life-threatening. PREVENTION  The best way to protect against getting a cold is to practice good hygiene. Avoid oral or hand contact with people with cold symptoms. Wash your hands often if contact occurs. There is no clear evidence that vitamin C, vitamin E, echinacea, or exercise reduces the chance of developing a cold. However,  it is always recommended to get plenty of rest and practice good nutrition. TREATMENT  Treatment is directed at relieving symptoms. There is no cure. Antibiotics are not effective, because the infection is caused by a virus, not by  bacteria. Treatment may include:  Increased fluid intake. Sports drinks offer valuable electrolytes, sugars, and fluids.  Breathing heated mist or steam (vaporizer or shower).  Eating chicken soup or other clear broths, and maintaining good nutrition.  Getting plenty of rest.  Using gargles or lozenges for comfort.  Controlling fevers with ibuprofen or acetaminophen as directed by your caregiver.  Increasing usage of your inhaler if you have asthma. Zinc gel and zinc lozenges, taken in the first 24 hours of the common cold, can shorten the duration and lessen the severity of symptoms. Pain medicines may help with fever, muscle aches, and throat pain. A variety of non-prescription medicines are available to treat congestion and runny nose. Your caregiver can make recommendations and may suggest nasal or lung inhalers for other symptoms.  HOME CARE INSTRUCTIONS   Only take over-the-counter or prescription medicines for pain, discomfort, or fever as directed by your caregiver.  Use a warm mist humidifier or inhale steam from a shower to increase air moisture. This may keep secretions moist and make it easier to breathe.  Drink enough water and fluids to keep your urine clear or pale yellow.  Rest as needed.  Return to work when your temperature has returned to normal or as your caregiver advises. You may need to stay home longer to avoid infecting others. You can also use a face mask and careful hand washing to prevent spread of the virus. SEEK MEDICAL CARE IF:   After the first few days, you feel you are getting worse rather than better.  You need your caregiver's advice about medicines to control symptoms.  You develop chills, worsening shortness of breath, or brown or red sputum. These may be signs of pneumonia.  You develop yellow or brown nasal discharge or pain in the face, especially when you bend forward. These may be signs of sinusitis.  You develop a fever, swollen neck  glands, pain with swallowing, or white areas in the back of your throat. These may be signs of strep throat. SEEK IMMEDIATE MEDICAL CARE IF:   You have a fever.  You develop severe or persistent headache, ear pain, sinus pain, or chest pain.  You develop wheezing, a prolonged cough, cough up blood, or have a change in your usual mucus (if you have chronic lung disease).  You develop sore muscles or a stiff neck. Document Released: 08/01/2000 Document Revised: 04/30/2011 Document Reviewed: 05/13/2013 [facility] Patient Information 2015 [place], [place]. This information is not intended to replace advice given to you by your health care provider. Make sure you discuss any questions you have with your health care provider.  Acute Bronchitis Bronchitis is inflammation of the airways that extend from the windpipe into the lungs (bronchi). The inflammation often causes mucus to develop. This leads to a cough, which is the most common symptom of bronchitis.  In acute bronchitis, the condition usually develops suddenly and goes away over time, usually in a couple weeks. Smoking, allergies, and asthma can make bronchitis worse. Repeated episodes of bronchitis may cause further lung problems.  CAUSES Acute bronchitis is most often caused by the same virus that causes a cold. The virus can spread from person to person (contagious) through coughing, sneezing, and touching contaminated objects. SIGNS AND SYMPTOMS  Cough.   Fever.   Coughing up mucus.   Body aches.   Chest congestion.   Chills.   Shortness of breath.   Sore throat.  DIAGNOSIS  Acute bronchitis is usually diagnosed through a physical exam. Your health care provider will also ask you questions about your medical history. Tests, such as chest X-rays, are sometimes done to rule out other conditions.  TREATMENT  Acute bronchitis usually goes away in a couple weeks. Oftentimes, no medical treatment is necessary. Medicines are  sometimes given for relief of fever or cough. Antibiotic medicines are usually not needed but may be prescribed in certain situations. In some cases, an inhaler may be recommended to help reduce shortness of breath and control the cough. A cool mist vaporizer may also be used to help thin bronchial secretions and make it easier to clear the chest.  HOME CARE INSTRUCTIONS  Get plenty of rest.   Drink enough fluids to keep your urine clear or pale yellow (unless you have a medical condition that requires fluid restriction). Increasing fluids may help thin your respiratory secretions (sputum) and reduce chest congestion, and it will prevent dehydration.   Take medicines only as directed by your health care provider.  If you were prescribed an antibiotic medicine, finish it all even if you start to feel better.  Avoid smoking and secondhand smoke. Exposure to cigarette smoke or irritating chemicals will make bronchitis worse. If you are a smoker, consider using nicotine gum or skin patches to help control withdrawal symptoms. Quitting smoking will help your lungs heal faster.   Reduce the chances of another bout of acute bronchitis by washing your hands frequently, avoiding people with cold symptoms, and trying not to touch your hands to your mouth, nose, or eyes.   Keep all follow-up visits as directed by your health care provider.  SEEK MEDICAL CARE IF: Your symptoms do not improve after 1 week of treatment.  SEEK IMMEDIATE MEDICAL CARE IF:  You develop an increased fever or chills.   You have chest pain.   You have severe shortness of breath.  You have bloody sputum.   You develop dehydration.  You faint or repeatedly feel like you are going to pass out.  You develop repeated vomiting.  You develop a severe headache. MAKE SURE YOU:   Understand these instructions.  Will watch your condition.  Will get help right away if you are not doing well or get worse. Document  Released: 03/15/2004 Document Revised: 06/22/2013 Document Reviewed: 07/29/2012 [facility] Patient Information 2015 [place], [place]. This information is not intended to replace advice given to you by your health care provider. Make sure you discuss any questions you have with your health care provider.

## 2014-03-06 NOTE — ED Provider Notes (Signed)
CSN: [id number]     Arrival date & time 03/06/14  [id number] History   First MD Initiated Contact with Patient 03/06/14 [phone]     Chief Complaint  Patient presents with  . Cough     (Consider location/radiation/quality/duration/timing/severity/associated sxs/prior Treatment) Patient is a 64 y.o. male presenting with cough. The history is provided by the patient. No language interpreter was used.  Cough Associated symptoms: wheezing   Associated symptoms: no chest pain, no chills, no fever, no headaches and no shortness of breath   JACQUIS OOLEY is a 64 y/o M with PMHx of HTN, GERD, high cholesterol, gout presenting to the ED with nasal congestion, post-nasal drip, and cough. Patient reported that the nasal congestion started last Saturday and stated that the cough started on Tuesday and has gotten worse. Patient reported that he's been coughing up a white sputum and at times he feels as if that he is wheezing. Reported a soreness in his throat secondary to the cough. Reported that he's been having pressure localized to the frontal and maxillary sinuses. When asked regarding a fever, patient reports that he is not sure about the fever. Patient stated that when he woke up this morning had a dizzy spell that lasted only a second. Reported that he used to be taking amlodipine for his blood pressure, but stated that he started on losartan/hydrochlorothiazide regarding his blood pressure control - stated that on Tuesday he had his episode of severe cramping in his legs, stated that this lasted for a couple minutes and then subsided. Patient reported that he has been having intermittent weakness generalized, legs bilaterally. Reported that he did receive the flu vaccine. Stated that he does chew tobacco. Reported that he was seen a cardiology last Monday where echo and stress test was performed-as per patient, reported that the results were unremarkable. Denied sick contacts, hematemesis, blurred vision, sudden  loss of vision, syncope, difficulty swallowing, chest pain, short of breath, difficulty breathing, abdominal pain, nausea, vomiting, diarrhea, leg swelling, neck pain, neck stiffness, numbness, tingling. PCP Dr. Glennon Mac  Past Medical History  Diagnosis Date  . Hypertension   . High cholesterol   . GERD (gastroesophageal reflux disease)   . Gout    Past Surgical History  Procedure Laterality Date  . Hernia repair     Family History  Problem Relation Age of Onset  . Diabetes Mother   . Diabetes Father    History  Substance Use Topics  . Smoking status: Former Smoker -- 1.00 packs/day for 30 years    Types: Cigarettes    Quit date: 02/20/1988  . Smokeless tobacco: Current User    Types: Chew  . Alcohol Use: Yes     Comment: occasionally    Review of Systems  Constitutional: Negative for fever and chills.  Eyes: Negative for visual disturbance.  Respiratory: Positive for cough and wheezing. Negative for chest tightness and shortness of breath.   Cardiovascular: Negative for chest pain.  Gastrointestinal: Negative for nausea, vomiting, abdominal pain and diarrhea.  Musculoskeletal: Negative for neck pain and neck stiffness.  Neurological: Negative for weakness, light-headedness, numbness and headaches.      Allergies  Review of patient's allergies indicates no known allergies.  Home Medications   Prior to Admission medications   Medication Sig Start Date End Date Taking? Authorizing Provider  azithromycin (ZITHROMAX) 250 MG tablet Take 1 tablet (250 mg total) by mouth daily. Take first 2 tablets together, then 1 every day until finished. 03/06/14  Miasha Emmons, PA-C   BP 152/83 mmHg  Pulse 86  Temp(Src) 97.9 F (36.6 C) (Oral)  Resp 18  Ht 6\' 1"  (1.854 m)  Wt 152 lb (68.947 kg)  BMI 20.06 kg/m2  SpO2 98% Physical Exam  Constitutional: He is oriented to person, place, and time. He appears well-developed and well-nourished. No distress.  HENT:  Head:  Normocephalic and atraumatic.  Mouth/Throat: Oropharynx is clear and moist. No oropharyngeal exudate.  Eyes: Conjunctivae and EOM are normal. Pupils are equal, round, and reactive to light. Right eye exhibits no discharge. Left eye exhibits no discharge.  Neck: Normal range of motion. Neck supple. No tracheal deviation present.  Negative neck stiffness Negative nuchal rigidity Negative cervical lymphadenopathy Negative meningeal signs  Cardiovascular: Normal rate, regular rhythm and normal heart sounds.   Cap refill less than 3 seconds Negative swelling or pitting edema identified to lower extremities bilaterally  Pulmonary/Chest: Effort normal. No respiratory distress. He has wheezes. He has no rales. He exhibits no tenderness.  Patient is able to speak in full sentences without difficulty Negative use of accessory muscles Negative stridor  Rhonchi and wheezing identified to the left lower and right lower lobes bilaterally Good lung expansion  Musculoskeletal: Normal range of motion.  Full ROM to upper and lower extremities without difficulty noted, negative ataxia noted.  Lymphadenopathy:    He has no cervical adenopathy.  Neurological: He is alert and oriented to person, place, and time. No cranial nerve deficit. He exhibits normal muscle tone. Coordination normal.  Cranial nerves III-XII grossly intact Strength 5+/5+ to upper and lower extremities bilaterally with resistance applied, equal distribution noted Equal grip strength bilaterally Negative facial droop Negative slurred speech Negative aphasia Patient follows commands well Patient responds to questions appropriately  Skin: Skin is warm and dry. No rash noted. He is not diaphoretic. No erythema.  Psychiatric: He has a normal mood and affect. His behavior is normal. Thought content normal.  Nursing note and vitals reviewed.   ED Course  Procedures (including critical care time)  Results for orders placed or performed  during the hospital encounter of 03/06/14  CBC  Result Value Ref Range   WBC 6.7 4.0 - 10.5 K/uL   RBC 4.74 4.22 - 5.81 MIL/uL   Hemoglobin 14.2 13.0 - 17.0 g/dL   HCT 42.5 39.0 - 52.0 %   MCV 89.7 78.0 - 100.0 fL   MCH 30.0 26.0 - 34.0 pg   MCHC 33.4 30.0 - 36.0 g/dL   RDW 13.7 11.5 - 15.5 %   Platelets 190 150 - 400 K/uL  Basic metabolic panel  Result Value Ref Range   Sodium 133 (L) 135 - 145 mmol/L   Potassium 4.1 3.5 - 5.1 mmol/L   Chloride 101 96 - 112 mEq/L   CO2 24 19 - 32 mmol/L   Glucose, Bld 145 (H) 70 - 99 mg/dL   BUN 16 6 - 23 mg/dL   Creatinine, Ser 1.40 (H) 0.50 - 1.35 mg/dL   Calcium 8.7 8.4 - 10.5 mg/dL   GFR calc non Af Amer 52 (L) >90 mL/min   GFR calc Af Amer 60 (L) >90 mL/min   Anion gap 8 5 - 15  Troponin I  Result Value Ref Range   Troponin I <0.03 <0.031 ng/mL    Labs Review Labs Reviewed  BASIC METABOLIC PANEL - Abnormal; Notable for the following:    Sodium 133 (*)    Glucose, Bld 145 (*)    Creatinine, Ser  1.40 (*)    GFR calc non Af Amer 52 (*)    GFR calc Af Amer 60 (*)    All other components within normal limits  CBC  TROPONIN I    Imaging Review Dg Chest 2 View  03/06/2014   CLINICAL DATA:  Productive cough.  No fever.  EXAM: CHEST  2 VIEW  COMPARISON:  05/31/2008  FINDINGS: The lungs are clear. There is slightly increased lucency at the left costophrenic angle and this could represent a bulla formation but minimally changed from the previous examination. Heart and mediastinum are within normal limits and stable. The trachea is midline. Bony structures are unremarkable.  IMPRESSION: No active cardiopulmonary disease.   Electronically Signed   By: Markus Daft M.D.   On: 03/06/2014 08:32     EKG Interpretation   Date/Time:  Saturday March 06 2014 08:56:15 EST Ventricular Rate:  73 PR Interval:  158 QRS Duration: 86 QT Interval:  355 QTC Calculation: 391 R Axis:   47 Text Interpretation:  Sinus rhythm Confirmed by Lacinda Axon  MD, BRIAN  ([id number]) on  03/06/2014 9:00:39 AM       9:01 AM Spoke with attending physician, Dr. Lindajo Royal regarding case. Agreed with plan of care. Suspicion high for common cold. Reported that patient can be discharged home with Z-pack.   9:46 AM Re-assessed patient. Wheezes in the right lobe has resolved after treatment. Discussed labs and imaging in great detail with patient. Discussed plan for discharge with patient who agrees to plan of care.   MDM   Final diagnoses:  URI (upper respiratory infection)  Bronchitis    Medications  albuterol (PROVENTIL HFA;VENTOLIN HFA) 108 (90 BASE) MCG/ACT inhaler 2 puff (not administered)  albuterol (PROVENTIL) (2.5 MG/3ML) 0.083% nebulizer solution 5 mg (5 mg Nebulization Given 03/06/14 0853)    Filed Vitals:   03/06/14 0740 03/06/14 0853 03/06/14 0957  BP: 164/94  152/83  Pulse:   86  Temp: 97.9 F (36.6 C)    TempSrc: Oral    Resp: 22  18  Height: 6\' 1"  (1.854 m)    Weight: 152 lb (68.947 kg)    SpO2: 99% 98% 98%    EKG noted normal sinus rhythm with a heart rate of 73 bpm. Troponin negative elevation. CBC negative elevated leukocytosis. Hemoglobin 14.2, hematocrit 42.5. BMP noted mildly elevated creatinine of 1.40-patient has had elevated creatinine before, 7 years ago was 3.06. Glucose 145, negative elevated anion gap-8.0 mEq per liter. Chest x-ray noted slight increase in lucency at the left costophrenic angle that could represent a bulla-minimally changed from previous examination, no findings for acute cardiopulmonary disease. Negative findings of pneumonia. Albuterol nebulizer treatment administered in ED setting with relief and decrease in wheezing. Suspicion to be upper respiratory infection. Discussed case with attending physician, Dr. Lacinda Axon who saw and assessed patient-agreed to plan of discharge. Patient stable, afebrile. Patient not septic appearing. Negative signs of respiratory distress. Discharged patient. Discharged patient with Z-Pak and  albuterol inhaler. Referred patient to primary care provider to be seen and reassessed. Discussed with patient to discuss with family care provider regarding blood pressure medication and recheck of creatinine. Discussed with patient to rest and stay hydrated and avoid any physical shortness activity. Discussed with patient to closely monitor symptoms and if symptoms are to worsen or change to report back to the ED - strict return instructions given.  Patient agreed to plan of care, understood, all questions answered.    Jamse Mead, PA-C 03/06/14  [place], MD 03/07/14 [phone]

## 2014-03-06 NOTE — ED Notes (Signed)
Patient c/o productive cough that is progressively getting worse. Per patient thick white sputum. Unsure of any fevers. Patient also reports constant clear nasal drainage.

## 2014-03-06 NOTE — ED Notes (Signed)
RT called

## 2015-09-29 ENCOUNTER — Emergency Department (HOSPITAL_COMMUNITY): Payer: PPO

## 2015-09-29 ENCOUNTER — Encounter (HOSPITAL_COMMUNITY): Payer: Self-pay

## 2015-09-29 ENCOUNTER — Emergency Department (HOSPITAL_COMMUNITY)
Admission: EM | Admit: 2015-09-29 | Discharge: 2015-09-29 | Disposition: A | Discharge status: Home / Self Care | Payer: PPO | Attending: Emergency Medicine | Admitting: Emergency Medicine

## 2015-09-29 DIAGNOSIS — R739 Hyperglycemia, unspecified: Secondary | ICD-10-CM

## 2015-09-29 DIAGNOSIS — I2510 Atherosclerotic heart disease of native coronary artery without angina pectoris: Secondary | ICD-10-CM | POA: Diagnosis not present

## 2015-09-29 DIAGNOSIS — N289 Disorder of kidney and ureter, unspecified: Secondary | ICD-10-CM | POA: Diagnosis not present

## 2015-09-29 DIAGNOSIS — K567 Ileus, unspecified: Secondary | ICD-10-CM

## 2015-09-29 DIAGNOSIS — I10 Essential (primary) hypertension: Secondary | ICD-10-CM | POA: Insufficient documentation

## 2015-09-29 DIAGNOSIS — Z79899 Other long term (current) drug therapy: Secondary | ICD-10-CM | POA: Diagnosis not present

## 2015-09-29 DIAGNOSIS — Z87891 Personal history of nicotine dependence: Secondary | ICD-10-CM | POA: Diagnosis not present

## 2015-09-29 DIAGNOSIS — R1013 Epigastric pain: Secondary | ICD-10-CM

## 2015-09-29 DIAGNOSIS — K6389 Other specified diseases of intestine: Secondary | ICD-10-CM | POA: Diagnosis not present

## 2015-09-29 HISTORY — DX: Atherosclerotic heart disease of native coronary artery without angina pectoris: I25.10

## 2015-09-29 LAB — CBC WITH DIFFERENTIAL/PLATELET
Basophils Absolute: 0 10*3/uL (ref 0.0–0.1)
Basophils Relative: 0 %
Eosinophils Absolute: 0.1 10*3/uL (ref 0.0–0.7)
Eosinophils Relative: 2 %
HEMATOCRIT: 42.8 % (ref 39.0–52.0)
HEMOGLOBIN: 15 g/dL (ref 13.0–17.0)
LYMPHS ABS: 1.8 10*3/uL (ref 0.7–4.0)
LYMPHS PCT: 23 %
MCH: 30 pg (ref 26.0–34.0)
MCHC: 35 g/dL (ref 30.0–36.0)
MCV: 85.6 fL (ref 78.0–100.0)
MONO ABS: 0.6 10*3/uL (ref 0.1–1.0)
MONOS PCT: 8 %
NEUTROS ABS: 5.2 10*3/uL (ref 1.7–7.7)
NEUTROS PCT: 67 %
Platelets: 210 10*3/uL (ref 150–400)
RBC: 5 MIL/uL (ref 4.22–5.81)
RDW: 13.1 % (ref 11.5–15.5)
WBC: 7.7 10*3/uL (ref 4.0–10.5)

## 2015-09-29 LAB — TROPONIN I
Troponin I: <0.03 ng/mL (ref <0.03)
Troponin I: <0.03 ng/mL (ref <0.03)

## 2015-09-29 LAB — COMPREHENSIVE METABOLIC PANEL
ALBUMIN: 4.4 g/dL (ref 3.5–5.0)
ALT: 28 U/L (ref 17–63)
ANION GAP: 14 (ref 5–15)
AST: 25 U/L (ref 15–41)
Alkaline Phosphatase: 62 U/L (ref 38–126)
BUN: 21 mg/dL — AB (ref 6–20)
CHLORIDE: 101 mmol/L (ref 101–111)
CO2: 22 mmol/L (ref 22–32)
Calcium: 9.4 mg/dL (ref 8.9–10.3)
Creatinine, Ser: 1.54 mg/dL — ABNORMAL HIGH (ref 0.61–1.24)
GFR calc Af Amer: 53 mL/min — ABNORMAL LOW (ref >60)
GFR calc non Af Amer: 46 mL/min — ABNORMAL LOW (ref >60)
GLUCOSE: 283 mg/dL — AB (ref 65–99)
POTASSIUM: 3.7 mmol/L (ref 3.5–5.1)
SODIUM: 137 mmol/L (ref 135–145)
Total Bilirubin: 0.8 mg/dL (ref 0.3–1.2)
Total Protein: 7.7 g/dL (ref 6.5–8.1)

## 2015-09-29 LAB — CBG MONITORING, ED: Glucose-Capillary: 260 mg/dL — ABNORMAL HIGH (ref 65–99)

## 2015-09-29 LAB — LIPASE, BLOOD: Lipase: 28 U/L (ref 11–51)

## 2015-09-29 MED ORDER — IOPAMIDOL (ISOVUE-300) INJECTION 61%
100.0000 mL | Freq: Once | INTRAVENOUS | Status: AC | PRN
  Administered 2015-09-29: 100 mL via INTRAVENOUS

## 2015-09-29 MED ORDER — RANITIDINE HCL 150 MG PO TABS
150.0000 mg | ORAL_TABLET | Freq: Two times a day (BID) | ORAL | 0 refills | Status: DC
Start: 2015-09-29 — End: 2018-10-31

## 2015-09-29 MED ORDER — PANTOPRAZOLE SODIUM 40 MG PO TBEC
40.0000 mg | DELAYED_RELEASE_TABLET | Freq: Once | ORAL | Status: AC
  Administered 2015-09-29: 40 mg via ORAL
  Filled 2015-09-29: qty 1

## 2015-09-29 MED ORDER — METFORMIN HCL 500 MG PO TABS
500.0000 mg | ORAL_TABLET | Freq: Once | ORAL | Status: AC
  Administered 2015-09-29: 500 mg via ORAL
  Filled 2015-09-29: qty 1

## 2015-09-29 MED ORDER — MORPHINE SULFATE (PF) 4 MG/ML IV SOLN
4.0000 mg | Freq: Once | INTRAVENOUS | Status: AC
  Administered 2015-09-29: 4 mg via INTRAVENOUS
  Filled 2015-09-29: qty 1

## 2015-09-29 MED ORDER — ONDANSETRON HCL 4 MG/2ML IJ SOLN
4.0000 mg | Freq: Once | INTRAMUSCULAR | Status: AC
  Administered 2015-09-29: 4 mg via INTRAVENOUS
  Filled 2015-09-29: qty 2

## 2015-09-29 MED ORDER — GI COCKTAIL ~~LOC~~
30.0000 mL | Freq: Once | ORAL | Status: AC
  Administered 2015-09-29: 30 mL via ORAL
  Filled 2015-09-29: qty 30

## 2015-09-29 MED ORDER — METFORMIN HCL 500 MG PO TABS: 500.0000 mg | ORAL_TABLET | Freq: Two times a day (BID) | ORAL | 0 refills | Status: DC

## 2015-09-29 MED ORDER — DIATRIZOATE MEGLUMINE & SODIUM 66-10 % PO SOLN
ORAL | Status: AC
  Filled 2015-09-29: qty 30

## 2015-09-29 NOTE — ED Provider Notes (Signed)
8:17 AM Assumed care from Dr. Roxanne Mins, please see their note for full history, physical and decision making until this point. In brief this is a 65 y.o. year old male who presented to the ED tonight with Abdominal Pain     65 year old male history of reflux, hypertension, hyperlipidemia and coronary artery disease presents to the emergency department today with epigastric pain that woke him up in the last night. He initially says it improved with a GI cocktail however slowly got worse again says now with a CT scan to evaluate for any pathology. Reportedly EKG and troponin were negative, however with a history of coronary artery disease with nondescript pain I think a second troponin and another repeat EKG will be useful to minimize the chance of missing acute coronary syndrome. I will also give him a dose of Zantac as I think his symptoms are more likely related to gastritis or esophagitis. Patient does have a history of smokeless tobacco so could be inflammation related to that or an early Barrett's esophagus or something of like, so may need PCP/GI follow up if not improving. ECG without significant chagnes.  CT with evidence of ileus, if troponin negative we will plan for stool softeners and likely PCP follow up, otherwise return here for new/worsening symptoms.    PROCEDURES Counseled patient for approximately 6 minutes regarding smokeless tobacco cessation. Discussed risks of smokeless tobacco and how they applied and affected their visit here today. Patient not ready to quit at this time, however will follow up with their primary doctor when they are.   CPT code: [id number]: intermediate counseling for smoking cessation    Discharge instructions, including strict return precautions for new or worsening symptoms, given. Patient and/or family verbalized understanding and agreement with the plan as described.   Labs, studies and imaging reviewed by myself and considered in medical decision making if  ordered. Imaging interpreted by radiology.  Labs Reviewed  COMPREHENSIVE METABOLIC PANEL - Abnormal; Notable for the following:       Result Value   Glucose, Bld 283 (*)    BUN 21 (*)    Creatinine, Ser 1.54 (*)    GFR calc non Af Amer 46 (*)    GFR calc Af Amer 53 (*)    All other components within normal limits  LIPASE, BLOOD  CBC WITH DIFFERENTIAL/PLATELET  TROPONIN I  TROPONIN I    CT ABDOMEN PELVIS W CONTRAST    (Results Pending)    EKG Interpretation  Date/Time:  Thursday September 29 2015 08:57:35 EDT Ventricular Rate:  61 PR Interval:    QRS Duration: 90 QT Interval:  405 QTC Calculation: 408 R Axis:   33 Text Interpretation:  Sinus rhythm Borderline T abnormalities, inferior leads no evolving changes when compared to multiple previous today.  Confirmed by [facility] MD, Corene Cornea [phone]) on 09/29/2015 9:44:44 AM         Merrily Pew, MD 09/30/15 1315

## 2015-09-29 NOTE — ED Notes (Signed)
Patient transported to CT 

## 2015-09-29 NOTE — ED Notes (Signed)
Peanut butter crackers and sprite zero given to pt

## 2015-09-29 NOTE — ED Triage Notes (Signed)
Hurting in epigastric area, that is constant and states that it is a dull ache.  Started around 1 am.  Nauseated without vomiting.  I ate last night and went directly to bed.  Feels like my reflux, but that does not normally bother me.

## 2015-09-29 NOTE — ED Provider Notes (Signed)
[facility] DEPT Provider Note   CSN: [id number] Arrival date & time: 09/29/15  [id number]  First Provider Contact:  First MD Initiated Contact with Patient 09/29/15 0545        History   Chief Complaint Chief Complaint  Patient presents with  . Abdominal Pain    HPI Gabriel Schultz is a 65 y.o. male.He has history of coronary disease, GERD, hypertension, hyperlipidemia. At 1 AM, he noted that pain in his epigastric area which gradually became worse. He rates pain at 8/10. He tried taking Mylanta but did not give him relief. In the past, Mylanta has given him relief from similar pain. There is associated nausea but no vomiting. He denies dyspnea. He did have diaphoresis. Nothing seems to be pain better or worse. He also relates that he had been told that he had a gallbladder attack many years ago but has not had any problems since then. He is a nonsmoker but does chew tobacco.  The history is provided by the patient.    Past Medical History:  Diagnosis Date  . Coronary artery disease   . GERD (gastroesophageal reflux disease)   . Gout   . High cholesterol   . Hypertension     There are no active problems to display for this patient.   Past Surgical History:  Procedure Laterality Date  . CORONARY STENT PLACEMENT    . HERNIA REPAIR         Home Medications    Prior to Admission medications   Medication Sig Start Date End Date Taking? Authorizing Provider  allopurinol (ZYLOPRIM) 100 MG tablet Take 100 mg by mouth daily.   Yes Historical Provider, MD  amLODipine (NORVASC) 10 MG tablet Take 10 mg by mouth daily.   Yes Historical Provider, MD  methocarbamol (ROBAXIN) 750 MG tablet Take 750 mg by mouth every 6 (six) hours as needed for muscle spasms.   Yes Historical Provider, MD  metoprolol (LOPRESSOR) 50 MG tablet Take 50 mg by mouth 2 (two) times daily.   Yes Historical Provider, MD  pantoprazole (PROTONIX) 40 MG tablet Take 40 mg by mouth daily.   Yes Historical  Provider, MD  azithromycin (ZITHROMAX) 250 MG tablet Take 1 tablet (250 mg total) by mouth daily. Take first 2 tablets together, then 1 every day until finished. 03/06/14   Marissa Sciacca, PA-C    Family History Family History  Problem Relation Age of Onset  . Diabetes Mother   . Diabetes Father     Social History Social History  Substance Use Topics  . Smoking status: Former Smoker    Packs/day: 1.00    Years: 30.00    Types: Cigarettes    Quit date: 02/20/1988  . Smokeless tobacco: Current User    Types: Chew  . Alcohol use Yes     Comment: occasionally     Allergies   Review of patient's allergies indicates no known allergies.   Review of Systems Review of Systems  All other systems reviewed and are negative.    Physical Exam Updated Vital Signs BP 159/90 (BP Location: Right Arm)   Pulse 71   Temp 97.6 F (36.4 C) (Oral)   Ht 6\' 1"  (1.854 m)   Wt 236 lb (107 kg)   SpO2 96%   BMI 31.14 kg/m   Physical Exam  Nursing note and vitals reviewed.  64 year old male, resting comfortably and in no acute distress. Vital signs are significant for hypertension. Oxygen saturation is 96%, which  is normal. Head is normocephalic and atraumatic. PERRLA, EOMI. Oropharynx is clear. Neck is nontender and supple without adenopathy or JVD. Back is nontender and there is no CVA tenderness. Lungs are clear without rales, wheezes, or rhonchi. Chest is nontender. Heart has regular rate and rhythm without murmur. Abdomen is soft, flat, nontender without masses or hepatosplenomegaly and peristalsis is normoactive. Extremities have no cyanosis or edema, full range of motion is present. Skin is warm and dry without rash. Neurologic: Mental status is normal, cranial nerves are intact, there are no motor or sensory deficits.  ED Treatments / Results  Labs (all labs ordered are listed, but only abnormal results are displayed) Labs Reviewed  COMPREHENSIVE METABOLIC PANEL - Abnormal;  Notable for the following:       Result Value   Glucose, Bld 283 (*)    BUN 21 (*)    Creatinine, Ser 1.54 (*)    GFR calc non Af Amer 46 (*)    GFR calc Af Amer 53 (*)    All other components within normal limits  LIPASE, BLOOD  CBC WITH DIFFERENTIAL/PLATELET  TROPONIN I    EKG  EKG Interpretation  Date/Time:  Thursday September 29 2015 05:40:56 EDT Ventricular Rate:  68 PR Interval:    QRS Duration: 87 QT Interval:  381 QTC Calculation: 406 R Axis:   21 Text Interpretation:  Sinus rhythm Baseline wander in lead(s) III Otherwise within normal limits When compared with ECG of 03/06/2014, No significant change was found Confirmed by [facility]  MD, Caelen Higinbotham ([id number]) on 09/29/2015 5:47:25 AM       EKG Interpretation  Date/Time:  Thursday September 29 2015 07:19:42 EDT Ventricular Rate:  68 PR Interval:    QRS Duration: 89 QT Interval:  388 QTC Calculation: 413 R Axis:   26 Text Interpretation:  Sinus rhythm Normal ECG No significant change since last tracing Confirmed by [facility]  MD, Elis Rawlinson ([id number]) on 09/29/2015 7:23:54 AM       Radiology No results found.  Procedures Procedures (including critical care time)  Medications Ordered in ED Medications  morphine 4 MG/ML injection 4 mg (not administered)  ondansetron (ZOFRAN) injection 4 mg (4 mg Intravenous Given 09/29/15 0557)  gi cocktail (Maalox,Lidocaine,Donnatal) (30 mLs Oral Given 09/29/15 0557)  pantoprazole (PROTONIX) EC tablet 40 mg (40 mg Oral Given 09/29/15 0556)  metFORMIN (GLUCOPHAGE) tablet 500 mg (500 mg Oral Given 09/29/15 0724)     Initial Impression / Assessment and Plan / ED Course  I have reviewed the triage vital signs and the nursing notes.  Pertinent labs & imaging results that were available during my care of the patient were reviewed by me and considered in my medical decision making (see chart for details).  Clinical Course   Epigastric pain of uncertain cause. No clues on physical exam. Review of old records  shows no relevant past visits, no abdominal imaging studies. He'll be given therapeutic trial of a GI cocktail, and screening labs obtained. ECG is unremarkable.  He had initial relief with GI cocktail with pain diminishing to 3/10, but pain has gradually built back up to 8/10. Repeat exam is unchanged. Laboratory workup shows slight worsening of prior renal insufficiency, significant hyperglycemia. Prior serum glucoses had been in the prediabetic range with one reading of 170. He clearly needs to be started on diabetic medication at this point. Troponin is come back at 0. ECG was repeated showing no change whatsoever. Remainder of laboratory evaluation was normal. We'll send for CT of  the abdomen and pelvis. Case is signed out to Dr. Dolly Rias to evaluate CT results.  Final Clinical Impressions(s) / ED Diagnoses   Final diagnoses:  Epigastric pain  Hyperglycemia  Renal insufficiency    New Prescriptions New Prescriptions   No medications on file     Delora Fuel, MD [id number]

## 2015-09-29 NOTE — ED Notes (Signed)
Returned from CT.

## 2015-09-30 DIAGNOSIS — I10 Essential (primary) hypertension: Secondary | ICD-10-CM | POA: Diagnosis not present

## 2015-09-30 DIAGNOSIS — Z6830 Body mass index (BMI) 30.0-30.9, adult: Secondary | ICD-10-CM | POA: Diagnosis not present

## 2015-09-30 DIAGNOSIS — Z23 Encounter for immunization: Secondary | ICD-10-CM | POA: Diagnosis not present

## 2015-09-30 DIAGNOSIS — E6609 Other obesity due to excess calories: Secondary | ICD-10-CM | POA: Diagnosis not present

## 2015-09-30 DIAGNOSIS — R1013 Epigastric pain: Secondary | ICD-10-CM | POA: Diagnosis not present

## 2015-09-30 DIAGNOSIS — E1165 Type 2 diabetes mellitus with hyperglycemia: Secondary | ICD-10-CM | POA: Diagnosis not present

## 2015-09-30 DIAGNOSIS — E782 Mixed hyperlipidemia: Secondary | ICD-10-CM | POA: Diagnosis not present

## 2015-11-01 DIAGNOSIS — E1165 Type 2 diabetes mellitus with hyperglycemia: Secondary | ICD-10-CM | POA: Diagnosis not present

## 2015-11-02 DIAGNOSIS — E1165 Type 2 diabetes mellitus with hyperglycemia: Secondary | ICD-10-CM | POA: Diagnosis not present

## 2016-01-27 DIAGNOSIS — I2510 Atherosclerotic heart disease of native coronary artery without angina pectoris: Secondary | ICD-10-CM | POA: Diagnosis not present

## 2016-01-27 DIAGNOSIS — Z23 Encounter for immunization: Secondary | ICD-10-CM | POA: Diagnosis not present

## 2016-01-27 DIAGNOSIS — E119 Type 2 diabetes mellitus without complications: Secondary | ICD-10-CM | POA: Diagnosis not present

## 2016-01-27 DIAGNOSIS — Z6829 Body mass index (BMI) 29.0-29.9, adult: Secondary | ICD-10-CM | POA: Diagnosis not present

## 2016-01-27 DIAGNOSIS — I10 Essential (primary) hypertension: Secondary | ICD-10-CM | POA: Diagnosis not present

## 2016-01-27 DIAGNOSIS — Z0001 Encounter for general adult medical examination with abnormal findings: Secondary | ICD-10-CM | POA: Diagnosis not present

## 2016-03-08 ENCOUNTER — Ambulatory Visit: Payer: PPO | Admitting: Cardiovascular Disease

## 2016-04-18 ENCOUNTER — Encounter: Payer: Self-pay | Admitting: Cardiovascular Disease

## 2016-04-18 ENCOUNTER — Ambulatory Visit (INDEPENDENT_AMBULATORY_CARE_PROVIDER_SITE_OTHER): Payer: PPO | Admitting: Cardiovascular Disease

## 2016-04-18 VITALS — BP 144/80 | HR 66 | Ht 73.0 in | Wt 242.0 lb

## 2016-04-18 DIAGNOSIS — I340 Nonrheumatic mitral (valve) insufficiency: Secondary | ICD-10-CM

## 2016-04-18 DIAGNOSIS — E7800 Pure hypercholesterolemia, unspecified: Secondary | ICD-10-CM | POA: Diagnosis not present

## 2016-04-18 DIAGNOSIS — I10 Essential (primary) hypertension: Secondary | ICD-10-CM

## 2016-04-18 DIAGNOSIS — Z955 Presence of coronary angioplasty implant and graft: Secondary | ICD-10-CM | POA: Diagnosis not present

## 2016-04-18 DIAGNOSIS — I25118 Atherosclerotic heart disease of native coronary artery with other forms of angina pectoris: Secondary | ICD-10-CM | POA: Diagnosis not present

## 2016-04-18 NOTE — Progress Notes (Signed)
CARDIOLOGY CONSULT NOTE  Patient ID: Gabriel Schultz MRN: [id number] DOB/AGE: 1951-01-05 66 y.o.  Admit date: (Not on file) Primary Physician: Jana Half Referring Physician:   Reason for Consultation: CAD  HPI: The patient is a 66 year old male whose cardiovascular history includes stenting of the right coronary artery and left circumflex coronary artery on 06/03/2008 and 06/29/2008, respectively. Nuclear stress testing on 04/24/2010 demonstrated normal myocardial perfusion and inferior wall soft tissue attenuation artifact, calculated LVEF 48%. Echocardiogram 05/29/2010 demonstrated normal left ventricular systolic function, LVEF greater than 55%, mild biatrial dilatation, mild mitral and tricuspid regurgitation, and borderline mitral valve prolapse.  He denies chest pain and shortness of breath. 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 He tries to keep his diabetes under control with proper eating habits. Prior to undergoing PCI, he had fatigue and shortness of breath. He did not complain of chest pain.  ECG on 09/29/15 showed normal sinus rhythm with nonspecific T-wave abnormalities in leads III and aVF.    No Known Allergies  Current Outpatient Prescriptions  Medication Sig Dispense Refill  . allopurinol (ZYLOPRIM) 100 MG tablet Take 100 mg by mouth daily.    [name] amLODipine (NORVASC) 10 MG tablet Take 10 mg by mouth daily.    [name] aspirin EC 81 MG tablet Take 81 mg by mouth daily.    [name] atorvastatin (LIPITOR) 10 MG tablet Take 10 mg by mouth daily.    . metFORMIN (GLUCOPHAGE) 500 MG tablet Take 1 tablet (500 mg total) by mouth 2 (two) times daily with a meal. 60 tablet 0  . methocarbamol (ROBAXIN) 750 MG tablet Take 750 mg by mouth every 6 (six) hours as needed for muscle spasms.    . metoprolol (LOPRESSOR) 50 MG tablet Take 50 mg by mouth 2 (two) times daily.    . naproxen (NAPROSYN) 500 MG tablet Take 500 mg by mouth 2 (two) times daily  with a meal.    . pantoprazole (PROTONIX) 40 MG tablet Take 40 mg by mouth daily.    . ranitidine (ZANTAC) 150 MG tablet Take 1 tablet (150 mg total) by mouth 2 (two) times daily. 60 tablet 0   No current facility-administered medications for this visit.     Past Medical History:  Diagnosis Date  . Coronary artery disease   . GERD (gastroesophageal reflux disease)   . Gout   . High cholesterol   . Hypertension     Past Surgical History:  Procedure Laterality Date  . CORONARY STENT PLACEMENT    . HERNIA REPAIR      Social History   Social History  . Marital status: Married    Spouse name: N/A  . Number of children: N/A  . Years of education: N/A   Occupational History  . Not on file.   Social History Main Topics  . Smoking status: Former Smoker    Packs/day: 1.00    Years: 30.00    Types: Cigarettes    Quit date: 02/20/1988  . Smokeless tobacco: Former Systems developer    Types: Chew    Quit date: 03/18/2016  . Alcohol use Yes     Comment: occasionally  . Drug use: No  . Sexual activity: Not on file   Other Topics Concern  . Not on file   Social History Narrative  . No narrative on file     No family history of premature CAD in 1st degree relatives.  Prior to Admission  medications   Medication Sig Start Date End Date Taking? Authorizing Provider  allopurinol (ZYLOPRIM) 100 MG tablet Take 100 mg by mouth daily.   Yes Historical Provider, MD  amLODipine (NORVASC) 10 MG tablet Take 10 mg by mouth daily.   Yes Historical Provider, MD  aspirin EC 81 MG tablet Take 81 mg by mouth daily.   Yes Historical Provider, MD  atorvastatin (LIPITOR) 10 MG tablet Take 10 mg by mouth daily.   Yes Historical Provider, MD  metFORMIN (GLUCOPHAGE) 500 MG tablet Take 1 tablet (500 mg total) by mouth 2 (two) times daily with a meal. 09/29/15  Yes Merrily Pew, MD  methocarbamol (ROBAXIN) 750 MG tablet Take 750 mg by mouth every 6 (six) hours as needed for muscle spasms.   Yes Historical  Provider, MD  metoprolol (LOPRESSOR) 50 MG tablet Take 50 mg by mouth 2 (two) times daily.   Yes Historical Provider, MD  naproxen (NAPROSYN) 500 MG tablet Take 500 mg by mouth 2 (two) times daily with a meal.   Yes Historical Provider, MD  pantoprazole (PROTONIX) 40 MG tablet Take 40 mg by mouth daily.   Yes Historical Provider, MD  ranitidine (ZANTAC) 150 MG tablet Take 1 tablet (150 mg total) by mouth 2 (two) times daily. 09/29/15  Yes Merrily Pew, MD     Review of systems complete and found to be negative unless listed above in HPI     Physical exam Blood pressure (!) 144/80, pulse 66, height 6\' 1"  (1.854 m), weight 242 lb (109.8 kg), SpO2 97 %. General: NAD Neck: No JVD, no thyromegaly or thyroid nodule.  Lungs: Clear to auscultation bilaterally with normal respiratory effort. CV: Nondisplaced PMI. Regular rate and rhythm, normal S1/S2, no S3/S4, 2/6 apical holosystolic murmur.  No peripheral edema.  No carotid bruit.  Abdomen: Soft, nontender, no distention.  Skin: Intact without lesions or rashes.  Neurologic: Alert and oriented x 3.  Psych: Normal affect. Extremities: No clubbing or cyanosis.  HEENT: Normal.   ECG: Most recent ECG reviewed.  Telemetry: Independently reviewed.  Labs:   Lab Results  Component Value Date   WBC 7.7 09/29/2015   HGB 15.0 09/29/2015   HCT 42.8 09/29/2015   MCV 85.6 09/29/2015   PLT 210 09/29/2015   No results for input(s): NA, K, CL, CO2, BUN, CREATININE, CALCIUM, PROT, BILITOT, ALKPHOS, ALT, AST, GLUCOSE in the last 168 hours.  Invalid input(s): LABALBU Lab Results  Component Value Date   ZOXWRUE 454 (H) 06/30/2008   CKMB 8.6 (H) 06/30/2008   TROPONINI <0.03 09/29/2015   No results found for: CHOL No results found for: HDL No results found for: LDLCALC No results found for: TRIG No results found for: CHOLHDL No results found for: LDLDIRECT       Studies: No results found.  ASSESSMENT AND PLAN:  1. CAD: Symptomatically  stable. Continue aspirin, Lipitor, and metoprolol.  2. Hypertension: Controlled. No changes.  3. Hyperlipidemia: Continue Lipitor. Will obtain copy of lipids from PCP.  4. Mitral regurgitation: Will obtain echocardiogram.  Dispo: fu 1 yr   Dispo:    Signed: Kate Sable, M.D., F.A.C.C.  04/18/2016, 8:40 AM

## 2016-04-18 NOTE — Patient Instructions (Signed)
Medication Instructions:  Your physician recommends that you continue on your current medications as directed. Please refer to the Current Medication list given to you today. Worthy Keeler: I WILL REQUEST A COPY OF LABS FROM PCP  Testing/Procedures: Your physician has requested that you have an echocardiogram. Echocardiography is a painless test that uses sound waves to create images of your heart. It provides your doctor with information about the size and shape of your heart and how well your heart's chambers and valves are working. This procedure takes approximately one hour. There are no restrictions for this procedure.    Follow-Up: Your physician wants you to follow-up in: 1 YEAR.  You will receive a reminder letter in the mail two months in advance. If you don't receive a letter, please call our office to schedule the follow-up appointment.   Any Other Special Instructions Will Be Listed Below (If Applicable).     If you need a refill on your cardiac medications before your next appointment, please call your pharmacy.

## 2016-04-19 ENCOUNTER — Telehealth: Payer: Self-pay

## 2016-04-19 DIAGNOSIS — E782 Mixed hyperlipidemia: Secondary | ICD-10-CM

## 2016-04-19 MED ORDER — ATORVASTATIN CALCIUM 40 MG PO TABS: 40.0000 mg | ORAL_TABLET | Freq: Every day | ORAL | 3 refills | Status: DC

## 2016-04-19 NOTE — Telephone Encounter (Signed)
Received lipids from [facility],per Dr Bronson Ing, increase lipitor to 40 mg daily and repeat lipids in 3 months,mailed lab slip,lm for pt to call back

## 2016-04-25 ENCOUNTER — Ambulatory Visit (HOSPITAL_COMMUNITY)
Admission: RE | Admit: 2016-04-25 | Discharge: 2016-04-25 | Disposition: A | Discharge status: Home / Self Care | Payer: PPO | Source: Ambulatory Visit | Attending: Cardiovascular Disease | Admitting: Cardiovascular Disease

## 2016-04-25 DIAGNOSIS — I2510 Atherosclerotic heart disease of native coronary artery without angina pectoris: Secondary | ICD-10-CM | POA: Insufficient documentation

## 2016-04-25 DIAGNOSIS — I358 Other nonrheumatic aortic valve disorders: Secondary | ICD-10-CM | POA: Insufficient documentation

## 2016-04-25 DIAGNOSIS — I340 Nonrheumatic mitral (valve) insufficiency: Secondary | ICD-10-CM | POA: Diagnosis not present

## 2016-04-25 DIAGNOSIS — I119 Hypertensive heart disease without heart failure: Secondary | ICD-10-CM | POA: Insufficient documentation

## 2016-04-25 DIAGNOSIS — Z87891 Personal history of nicotine dependence: Secondary | ICD-10-CM | POA: Insufficient documentation

## 2016-04-25 NOTE — Progress Notes (Signed)
*  PRELIMINARY RESULTS* Echocardiogram 2D Echocardiogram has been performed.  Leavy Cella 04/25/2016, 9:20 AM

## 2016-04-26 ENCOUNTER — Telehealth: Payer: Self-pay

## 2016-04-26 DIAGNOSIS — I10 Essential (primary) hypertension: Secondary | ICD-10-CM | POA: Diagnosis not present

## 2016-04-26 DIAGNOSIS — Z6831 Body mass index (BMI) 31.0-31.9, adult: Secondary | ICD-10-CM | POA: Diagnosis not present

## 2016-04-26 DIAGNOSIS — E782 Mixed hyperlipidemia: Secondary | ICD-10-CM | POA: Diagnosis not present

## 2016-04-26 DIAGNOSIS — E6609 Other obesity due to excess calories: Secondary | ICD-10-CM | POA: Diagnosis not present

## 2016-04-26 DIAGNOSIS — I340 Nonrheumatic mitral (valve) insufficiency: Secondary | ICD-10-CM | POA: Diagnosis not present

## 2016-04-26 DIAGNOSIS — I25118 Atherosclerotic heart disease of native coronary artery with other forms of angina pectoris: Secondary | ICD-10-CM | POA: Diagnosis not present

## 2016-04-26 DIAGNOSIS — E1165 Type 2 diabetes mellitus with hyperglycemia: Secondary | ICD-10-CM | POA: Diagnosis not present

## 2016-04-26 DIAGNOSIS — Z1389 Encounter for screening for other disorder: Secondary | ICD-10-CM | POA: Diagnosis not present

## 2016-04-26 NOTE — Telephone Encounter (Signed)
-----   Message from Laurine Blazer, LPN sent at 10/20/9164 11:50 AM EST -----   ----- Message ----- From: Herminio Commons, MD Sent: 04/26/2016   9:20 AM To: Laurine Blazer, LPN  Normal pumping function.

## 2016-04-26 NOTE — Telephone Encounter (Signed)
Called pt., no answer. Left message for pt to return call.  

## 2016-06-04 DIAGNOSIS — Z1389 Encounter for screening for other disorder: Secondary | ICD-10-CM | POA: Diagnosis not present

## 2016-06-04 DIAGNOSIS — R0981 Nasal congestion: Secondary | ICD-10-CM | POA: Diagnosis not present

## 2016-06-04 DIAGNOSIS — J3489 Other specified disorders of nose and nasal sinuses: Secondary | ICD-10-CM | POA: Diagnosis not present

## 2016-06-04 DIAGNOSIS — Z6831 Body mass index (BMI) 31.0-31.9, adult: Secondary | ICD-10-CM | POA: Diagnosis not present

## 2016-06-04 DIAGNOSIS — R05 Cough: Secondary | ICD-10-CM | POA: Diagnosis not present

## 2016-06-04 DIAGNOSIS — E6609 Other obesity due to excess calories: Secondary | ICD-10-CM | POA: Diagnosis not present

## 2016-07-17 DIAGNOSIS — H524 Presbyopia: Secondary | ICD-10-CM | POA: Diagnosis not present

## 2016-07-17 DIAGNOSIS — E119 Type 2 diabetes mellitus without complications: Secondary | ICD-10-CM | POA: Diagnosis not present

## 2016-07-17 DIAGNOSIS — H5203 Hypermetropia, bilateral: Secondary | ICD-10-CM | POA: Diagnosis not present

## 2016-07-17 DIAGNOSIS — H52203 Unspecified astigmatism, bilateral: Secondary | ICD-10-CM | POA: Diagnosis not present

## 2016-08-30 ENCOUNTER — Other Ambulatory Visit: Payer: Self-pay | Admitting: Ophthalmology

## 2016-08-30 DIAGNOSIS — D2312 Other benign neoplasm of skin of left eyelid, including canthus: Secondary | ICD-10-CM | POA: Diagnosis not present

## 2016-08-30 DIAGNOSIS — D485 Neoplasm of uncertain behavior of skin: Secondary | ICD-10-CM | POA: Diagnosis not present

## 2016-08-30 DIAGNOSIS — L821 Other seborrheic keratosis: Secondary | ICD-10-CM | POA: Diagnosis not present

## 2016-09-21 DIAGNOSIS — I10 Essential (primary) hypertension: Secondary | ICD-10-CM | POA: Diagnosis not present

## 2016-09-21 DIAGNOSIS — Z6831 Body mass index (BMI) 31.0-31.9, adult: Secondary | ICD-10-CM | POA: Diagnosis not present

## 2016-09-21 DIAGNOSIS — E782 Mixed hyperlipidemia: Secondary | ICD-10-CM | POA: Diagnosis not present

## 2016-09-21 DIAGNOSIS — R011 Cardiac murmur, unspecified: Secondary | ICD-10-CM | POA: Diagnosis not present

## 2016-09-21 DIAGNOSIS — E1129 Type 2 diabetes mellitus with other diabetic kidney complication: Secondary | ICD-10-CM | POA: Diagnosis not present

## 2016-09-21 DIAGNOSIS — E6609 Other obesity due to excess calories: Secondary | ICD-10-CM | POA: Diagnosis not present

## 2016-09-25 DIAGNOSIS — Z1211 Encounter for screening for malignant neoplasm of colon: Secondary | ICD-10-CM | POA: Diagnosis not present

## 2016-10-08 ENCOUNTER — Telehealth: Payer: Self-pay

## 2016-10-08 NOTE — Telephone Encounter (Signed)
[phone]  Patient received letter to schedule tcs

## 2016-10-16 ENCOUNTER — Telehealth: Payer: Self-pay

## 2016-10-16 NOTE — Telephone Encounter (Signed)
See separate triage.  

## 2016-10-16 NOTE — Telephone Encounter (Signed)
Pt said he had a colonoscopy about 5-6 years ago at [facility]. He is not sure who did it. I will check on that and he will call back tomorrow with his med list.

## 2016-10-24 NOTE — Telephone Encounter (Signed)
PT's last colonoscopy was 12/05/2006 by Dr. Arnoldo Morale. Pt was to repeat in 3 years.  LMOM for a return call.  Need medication list to complete the triage.

## 2016-10-26 NOTE — Telephone Encounter (Signed)
Ok to schedule.  Day before procedure: glipizide 1/2 tablet before breakfast, metformin 500 mg twice a day Day of procedure: No diabetes meds. He can take his Norvasc, metoprolol with sip of water

## 2016-10-26 NOTE — Telephone Encounter (Signed)
Gastroenterology Pre-Procedure Review  Request Date: 10/16/2016 Requesting Physician: Delman Cheadle, PA  PATIENT REVIEW QUESTIONS: The patient responded to the following health history questions as indicated:     Pt had colonoscopy by Dr. Arnoldo Morale 12/05/2006. Family hx colon cancer in mom diagnosed in her 58's. Was advised to have next one in 3 years.   Copy of procedure and path retrieved from [facility] and will be scanned under media.   1. Diabetes Melitis: YES 2. Joint replacements in the past 12 months: no 3. Major health problems in the past 3 months: no 4. Has an artificial valve or MVP: no 5. Has a defibrillator: no 6. Has been advised in past to take antibiotics in advance of a procedure like teeth cleaning: no 7. Family history of colon cancer: Mother, diagnosed in her 29's 8. Alcohol Use: OCCASIONALLY, MAYBE A 6 PACK EVERY 6 MONTHS 9. History of sleep apnea: no  10. History of coronary artery or other vascular stents placed within the last 12 months: no 11. History of any prior anesthesia complications: no    MEDICATIONS & ALLERGIES:    Patient reports the following regarding taking any blood thinners:   Plavix? no Aspirin? YES Coumadin? no Brilinta? no Xarelto? no Eliquis? no Pradaxa? no Savaysa? no Effient? no  Patient confirms/reports the following medications:  Current Outpatient Prescriptions  Medication Sig Dispense Refill  . allopurinol (ZYLOPRIM) 100 MG tablet Take 100 mg by mouth daily.    [name] amLODipine (NORVASC) 10 MG tablet Take 10 mg by mouth daily.    [name] aspirin EC 81 MG tablet Take 81 mg by mouth daily.    [name] glipiZIDE (GLUCOTROL) 5 MG tablet Take 5 mg by mouth daily before breakfast.    . metFORMIN (GLUCOPHAGE) 500 MG tablet Take 1 tablet (500 mg total) by mouth 2 (two) times daily with a meal. (Patient taking differently: Take 1,000 mg by mouth 2 (two) times daily with a meal. ) 60 tablet 0  . methocarbamol (ROBAXIN) 750 MG tablet Take 750  mg by mouth every 6 (six) hours as needed for muscle spasms. PT has not taken this in a month or so    . metoprolol (LOPRESSOR) 50 MG tablet Take 50 mg by mouth 2 (two) times daily. Pt just takes once a day    . naproxen (NAPROSYN) 500 MG tablet Take 500 mg by mouth daily as needed.     . pantoprazole (PROTONIX) 40 MG tablet Take 40 mg by mouth daily.    . pravastatin (PRAVACHOL) 20 MG tablet Take 20 mg by mouth daily.    . ranitidine (ZANTAC) 150 MG tablet Take 1 tablet (150 mg total) by mouth 2 (two) times daily. 60 tablet 0  . atorvastatin (LIPITOR) 40 MG tablet Take 1 tablet (40 mg total) by mouth daily. 90 tablet 3   No current facility-administered medications for this visit.     Patient confirms/reports the following allergies:  No Known Allergies  No orders of the defined types were placed in this encounter.   AUTHORIZATION INFORMATION Primary Insurance:   ID #:   Group #:  Pre-Cert / Auth required Pre-Cert / Auth #:   Secondary Insurance:   ID #:   Group #:  Pre-Cert / Auth required: Pre-Cert / Auth #:   SCHEDULE INFORMATION: Procedure has been scheduled as follows:  Date:  11/23/2016             Time:  8:30 AM Location: [place]  Stay  This Gastroenterology Pre-Precedure Review Form is being routed to the following provider(s): R. Garfield Cornea, MD

## 2016-10-29 ENCOUNTER — Other Ambulatory Visit: Payer: Self-pay

## 2016-10-29 DIAGNOSIS — Z800 Family history of malignant neoplasm of digestive organs: Secondary | ICD-10-CM

## 2016-10-31 MED ORDER — PEG 3350-KCL-NA BICARB-NACL 420 G PO SOLR: 4000.0000 mL | ORAL | 0 refills | Status: DC

## 2016-10-31 NOTE — Telephone Encounter (Signed)
Rx sent to the pharmacy and instructions mailed to pt.  

## 2016-11-15 ENCOUNTER — Telehealth: Payer: Self-pay

## 2016-11-15 NOTE — Telephone Encounter (Signed)
Pt called to cancel his TCS for 11/23/16 because he is having to go out of town for work. He said that he would call us back when he got back into town.

## 2016-11-15 NOTE — Telephone Encounter (Signed)
LMOM for Gabriel Schultz to cancel the appt, pt to call when he wants to reschedule.

## 2016-11-23 ENCOUNTER — Ambulatory Visit (HOSPITAL_COMMUNITY): Admission: RE | Admit: 2016-11-23 | Payer: PPO | Source: Ambulatory Visit | Admitting: Internal Medicine

## 2016-11-23 ENCOUNTER — Encounter (HOSPITAL_COMMUNITY): Admission: RE | Payer: Self-pay | Source: Ambulatory Visit

## 2016-11-23 SURGERY — COLONOSCOPY
Anesthesia: Moderate Sedation

## 2016-12-04 ENCOUNTER — Emergency Department (HOSPITAL_COMMUNITY)
Admission: EM | Admit: 2016-12-04 | Discharge: 2016-12-04 | Disposition: A | Discharge status: Home / Self Care | Payer: PPO | Attending: Emergency Medicine | Admitting: Emergency Medicine

## 2016-12-04 ENCOUNTER — Encounter (HOSPITAL_COMMUNITY): Payer: Self-pay | Admitting: *Deleted

## 2016-12-04 DIAGNOSIS — T304 Corrosion of unspecified body region, unspecified degree: Secondary | ICD-10-CM

## 2016-12-04 DIAGNOSIS — T65891A Toxic effect of other specified substances, accidental (unintentional), initial encounter: Secondary | ICD-10-CM | POA: Insufficient documentation

## 2016-12-04 DIAGNOSIS — E119 Type 2 diabetes mellitus without complications: Secondary | ICD-10-CM | POA: Diagnosis not present

## 2016-12-04 DIAGNOSIS — Y9389 Activity, other specified: Secondary | ICD-10-CM | POA: Insufficient documentation

## 2016-12-04 DIAGNOSIS — I10 Essential (primary) hypertension: Secondary | ICD-10-CM | POA: Diagnosis not present

## 2016-12-04 DIAGNOSIS — Z955 Presence of coronary angioplasty implant and graft: Secondary | ICD-10-CM | POA: Insufficient documentation

## 2016-12-04 DIAGNOSIS — I2510 Atherosclerotic heart disease of native coronary artery without angina pectoris: Secondary | ICD-10-CM | POA: Diagnosis not present

## 2016-12-04 DIAGNOSIS — Y999 Unspecified external cause status: Secondary | ICD-10-CM | POA: Diagnosis not present

## 2016-12-04 DIAGNOSIS — Y929 Unspecified place or not applicable: Secondary | ICD-10-CM | POA: Diagnosis not present

## 2016-12-04 DIAGNOSIS — T6591XA Toxic effect of unspecified substance, accidental (unintentional), initial encounter: Secondary | ICD-10-CM | POA: Diagnosis not present

## 2016-12-04 DIAGNOSIS — X088XXA Exposure to other specified smoke, fire and flames, initial encounter: Secondary | ICD-10-CM | POA: Diagnosis not present

## 2016-12-04 DIAGNOSIS — Z7982 Long term (current) use of aspirin: Secondary | ICD-10-CM | POA: Insufficient documentation

## 2016-12-04 DIAGNOSIS — Z23 Encounter for immunization: Secondary | ICD-10-CM | POA: Insufficient documentation

## 2016-12-04 DIAGNOSIS — T2102XA Burn of unspecified degree of abdominal wall, initial encounter: Secondary | ICD-10-CM | POA: Insufficient documentation

## 2016-12-04 DIAGNOSIS — Z7902 Long term (current) use of antithrombotics/antiplatelets: Secondary | ICD-10-CM | POA: Diagnosis not present

## 2016-12-04 DIAGNOSIS — Z7984 Long term (current) use of oral hypoglycemic drugs: Secondary | ICD-10-CM | POA: Diagnosis not present

## 2016-12-04 DIAGNOSIS — Z79899 Other long term (current) drug therapy: Secondary | ICD-10-CM | POA: Diagnosis not present

## 2016-12-04 HISTORY — DX: Type 2 diabetes mellitus without complications: E11.9

## 2016-12-04 MED ORDER — TETANUS-DIPHTH-ACELL PERTUSSIS 5-2.5-18.5 LF-MCG/0.5 IM SUSP
0.5000 mL | Freq: Once | INTRAMUSCULAR | Status: AC
  Administered 2016-12-04: 0.5 mL via INTRAMUSCULAR
  Filled 2016-12-04: qty 0.5

## 2016-12-04 MED ORDER — BACITRACIN 500 UNIT/GM EX OINT: 1.0000 "application " | TOPICAL_OINTMENT | Freq: Two times a day (BID) | CUTANEOUS | 0 refills | Status: DC

## 2016-12-04 NOTE — ED Notes (Signed)
Pt is in shower at present time.
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
## 2017-01-17 DIAGNOSIS — E119 Type 2 diabetes mellitus without complications: Secondary | ICD-10-CM | POA: Diagnosis not present

## 2017-01-17 DIAGNOSIS — Z6831 Body mass index (BMI) 31.0-31.9, adult: Secondary | ICD-10-CM | POA: Diagnosis not present

## 2017-01-17 DIAGNOSIS — Z1389 Encounter for screening for other disorder: Secondary | ICD-10-CM | POA: Diagnosis not present

## 2017-01-17 DIAGNOSIS — E6609 Other obesity due to excess calories: Secondary | ICD-10-CM | POA: Diagnosis not present

## 2017-01-17 DIAGNOSIS — Z0000 Encounter for general adult medical examination without abnormal findings: Secondary | ICD-10-CM | POA: Diagnosis not present

## 2017-03-14 DIAGNOSIS — I10 Essential (primary) hypertension: Secondary | ICD-10-CM | POA: Diagnosis not present

## 2017-03-14 DIAGNOSIS — K219 Gastro-esophageal reflux disease without esophagitis: Secondary | ICD-10-CM | POA: Diagnosis not present

## 2017-03-14 DIAGNOSIS — E1129 Type 2 diabetes mellitus with other diabetic kidney complication: Secondary | ICD-10-CM | POA: Diagnosis not present

## 2017-03-14 DIAGNOSIS — J069 Acute upper respiratory infection, unspecified: Secondary | ICD-10-CM | POA: Diagnosis not present

## 2017-03-14 DIAGNOSIS — Z6832 Body mass index (BMI) 32.0-32.9, adult: Secondary | ICD-10-CM | POA: Diagnosis not present

## 2017-03-14 DIAGNOSIS — E782 Mixed hyperlipidemia: Secondary | ICD-10-CM | POA: Diagnosis not present

## 2017-03-14 DIAGNOSIS — M109 Gout, unspecified: Secondary | ICD-10-CM | POA: Diagnosis not present

## 2017-03-19 DIAGNOSIS — J0100 Acute maxillary sinusitis, unspecified: Secondary | ICD-10-CM | POA: Diagnosis not present

## 2017-04-03 DIAGNOSIS — B370 Candidal stomatitis: Secondary | ICD-10-CM | POA: Diagnosis not present

## 2017-04-24 ENCOUNTER — Ambulatory Visit: Payer: PPO | Admitting: Cardiovascular Disease

## 2017-06-27 ENCOUNTER — Ambulatory Visit: Payer: PPO | Admitting: Cardiovascular Disease

## 2017-06-27 DIAGNOSIS — R0989 Other specified symptoms and signs involving the circulatory and respiratory systems: Secondary | ICD-10-CM

## 2017-06-28 ENCOUNTER — Encounter: Payer: Self-pay | Admitting: Cardiovascular Disease

## 2017-07-18 DIAGNOSIS — E1129 Type 2 diabetes mellitus with other diabetic kidney complication: Secondary | ICD-10-CM | POA: Diagnosis not present

## 2017-07-19 ENCOUNTER — Ambulatory Visit: Payer: PPO | Admitting: Cardiovascular Disease

## 2017-11-13 DIAGNOSIS — E1140 Type 2 diabetes mellitus with diabetic neuropathy, unspecified: Secondary | ICD-10-CM | POA: Diagnosis not present

## 2017-11-13 DIAGNOSIS — E6609 Other obesity due to excess calories: Secondary | ICD-10-CM | POA: Diagnosis not present

## 2017-11-13 DIAGNOSIS — Z0001 Encounter for general adult medical examination with abnormal findings: Secondary | ICD-10-CM | POA: Diagnosis not present

## 2017-11-13 DIAGNOSIS — E1129 Type 2 diabetes mellitus with other diabetic kidney complication: Secondary | ICD-10-CM | POA: Diagnosis not present

## 2017-11-13 DIAGNOSIS — Z6831 Body mass index (BMI) 31.0-31.9, adult: Secondary | ICD-10-CM | POA: Diagnosis not present

## 2017-11-13 DIAGNOSIS — Z1389 Encounter for screening for other disorder: Secondary | ICD-10-CM | POA: Diagnosis not present

## 2017-11-13 DIAGNOSIS — Z23 Encounter for immunization: Secondary | ICD-10-CM | POA: Diagnosis not present

## 2017-12-26 DIAGNOSIS — Z0001 Encounter for general adult medical examination with abnormal findings: Secondary | ICD-10-CM | POA: Diagnosis not present

## 2017-12-26 DIAGNOSIS — Z23 Encounter for immunization: Secondary | ICD-10-CM | POA: Diagnosis not present

## 2017-12-26 DIAGNOSIS — Z1389 Encounter for screening for other disorder: Secondary | ICD-10-CM | POA: Diagnosis not present

## 2017-12-26 DIAGNOSIS — E782 Mixed hyperlipidemia: Secondary | ICD-10-CM | POA: Diagnosis not present

## 2018-05-02 IMAGING — CT CT ABD-PELV W/ CM
2 of 5 series · 15 of 46 positions shown, 17 images · IV contrast (iopamidol)
Comparison: None.

CLINICAL DATA: Abdominal pain

EXAM:
CT ABDOMEN AND PELVIS WITH CONTRAST
TECHNIQUE: Multidetector CT imaging of the abdomen and pelvis was performed
using the standard protocol following bolus administration of
intravenous contrast.
CONTRAST:  100mL FSJKYZ-D33 IOPAMIDOL (FSJKYZ-D33) INJECTION 61%

[Series 2: routine abd pel with · axial · 0.81mm/px · z∈[-491,-36]mm · 12 of 105 slices shown, 14 images]
[im 7/105  soft-tissue]
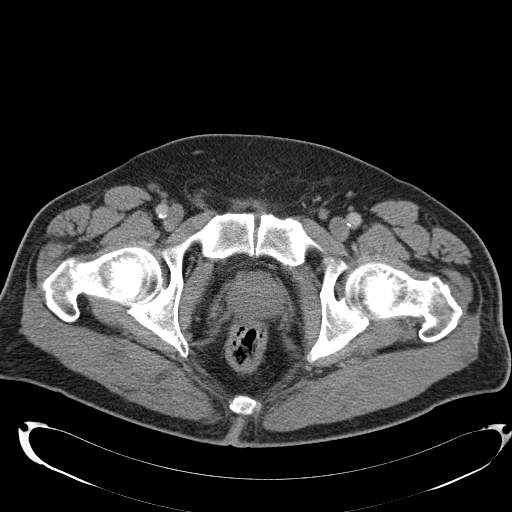
[im 7/105  bone]
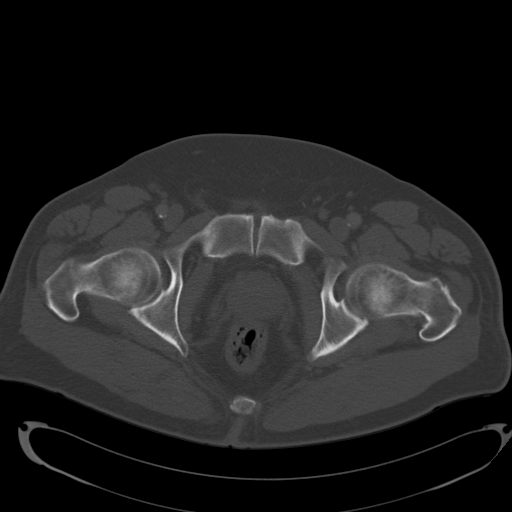
[im 19/105  soft-tissue]
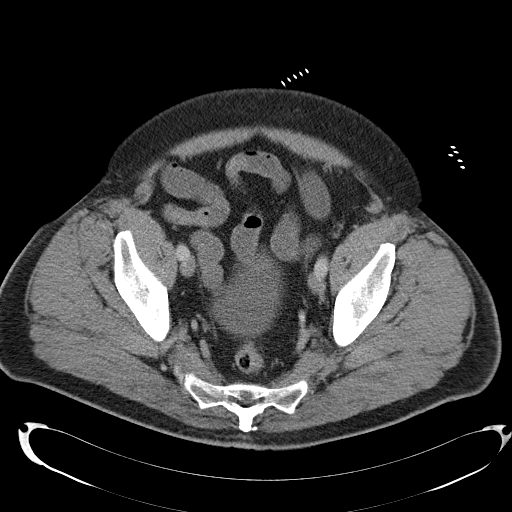
[im 25/105  soft-tissue]
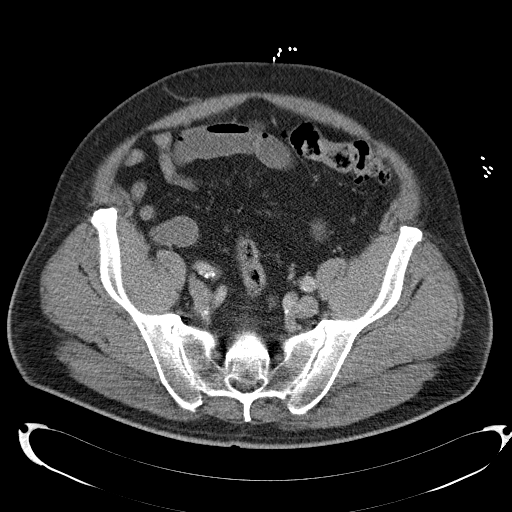
[im 31/105  soft-tissue]
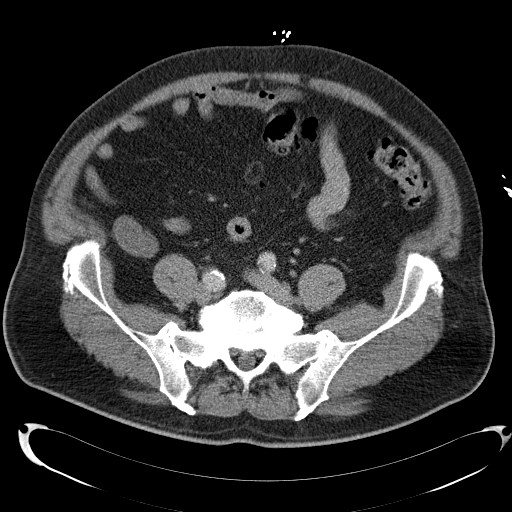
[im 43/105  soft-tissue]
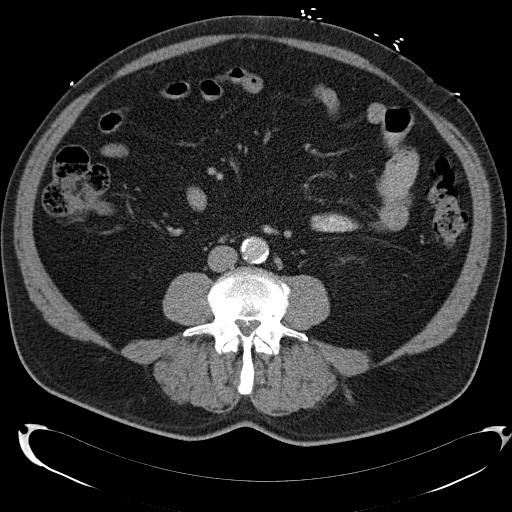
[im 49/105  soft-tissue]
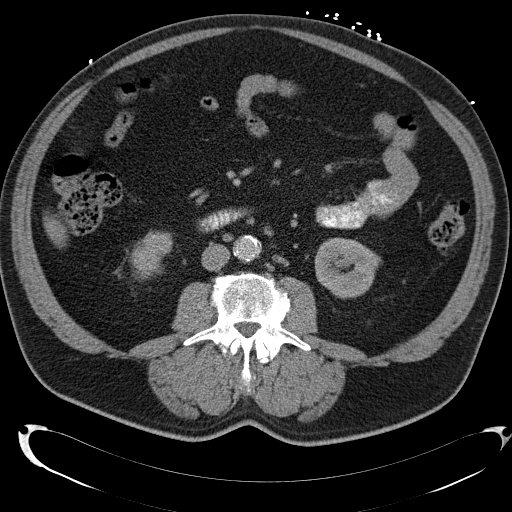
[im 56/105  soft-tissue]
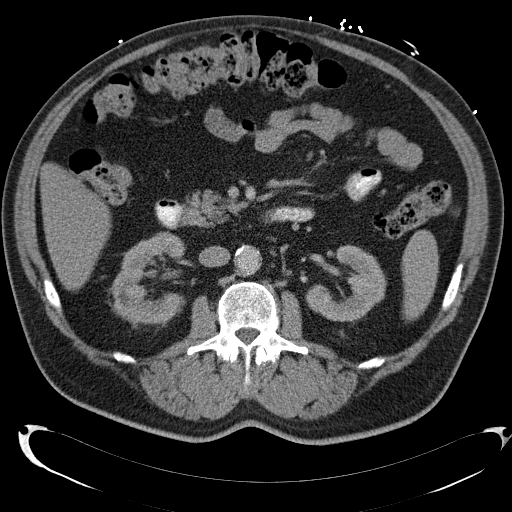
[im 68/105  soft-tissue]
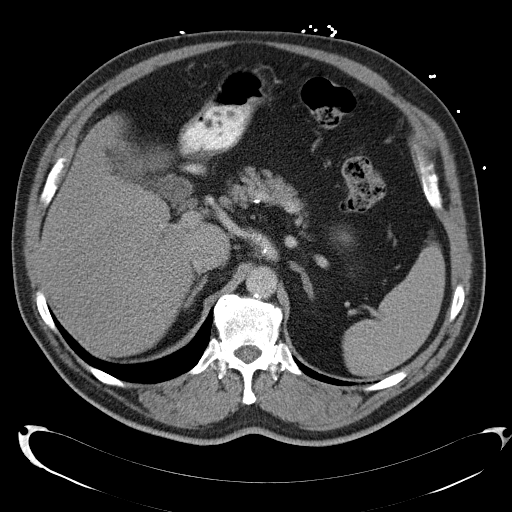
[im 74/105  soft-tissue]
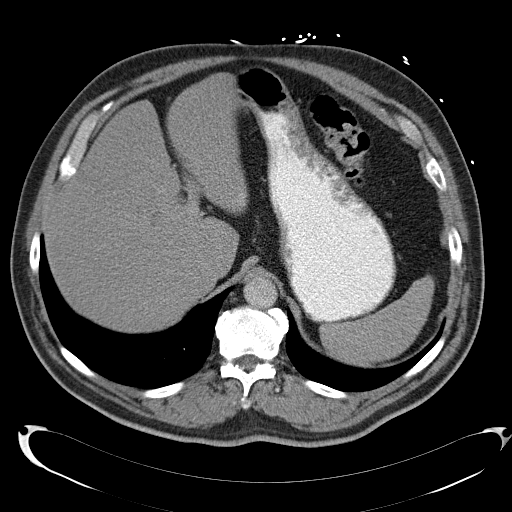
[im 74/105  bone]
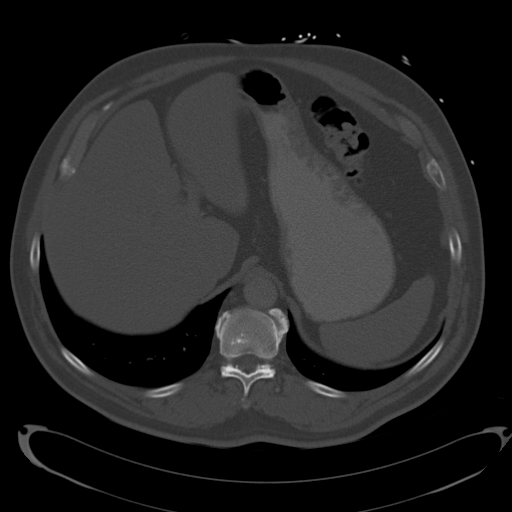
[im 80/105  soft-tissue]
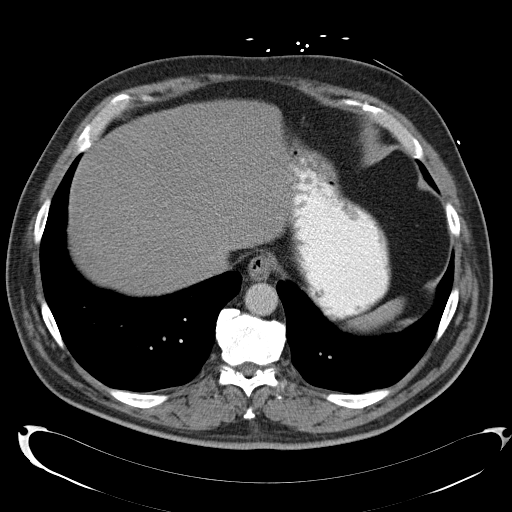
[im 92/105  soft-tissue]
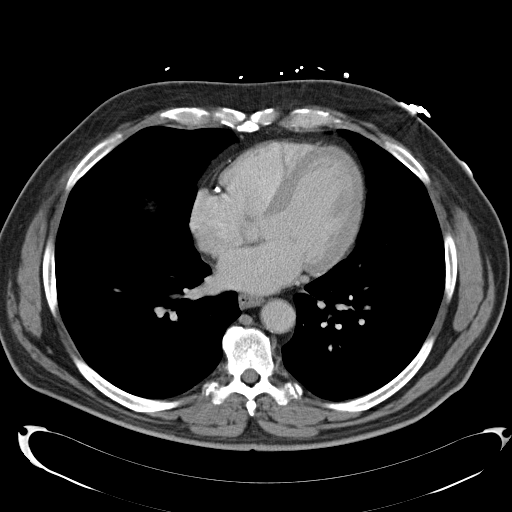
[im 98/105  soft-tissue]
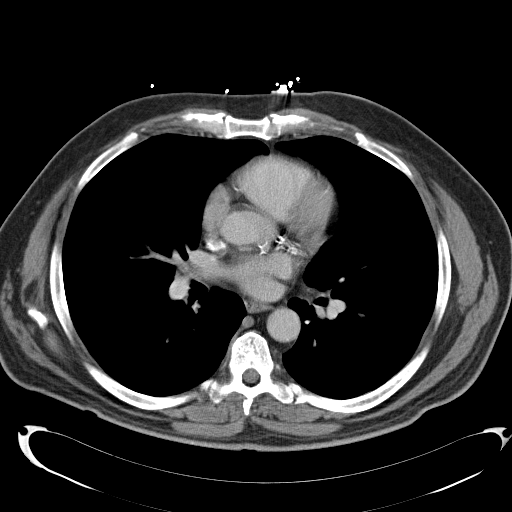

[Series 4: coronal · coronal · 0.77mm/px · 3 of 170 slices shown]
[im 57/170  soft-tissue]
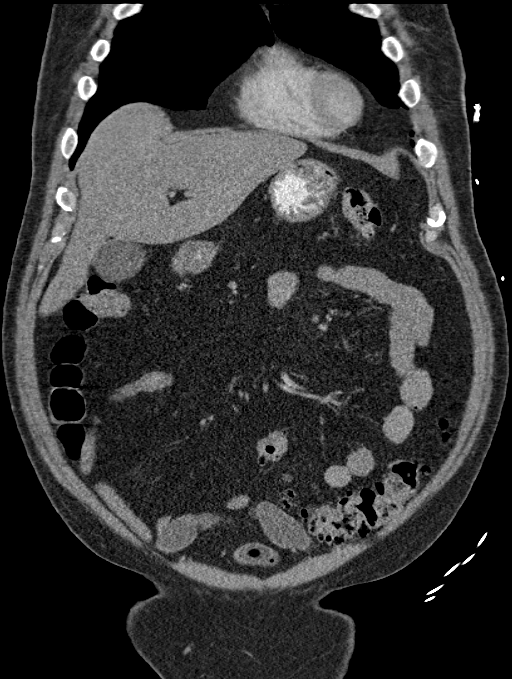
[im 76/170  soft-tissue]
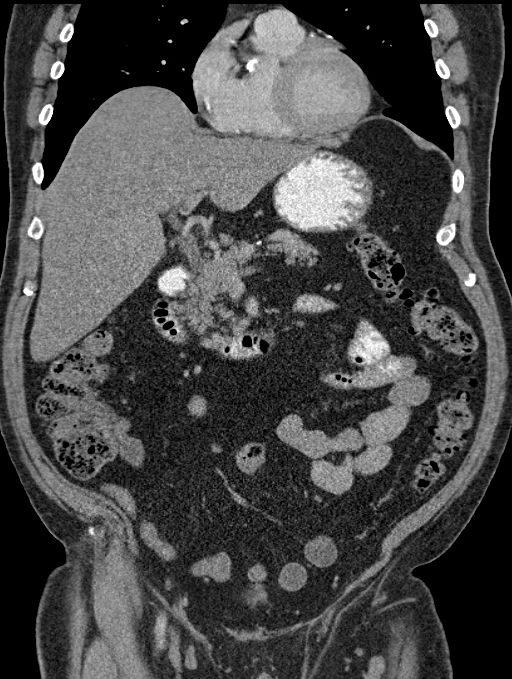
[im 94/170  soft-tissue]
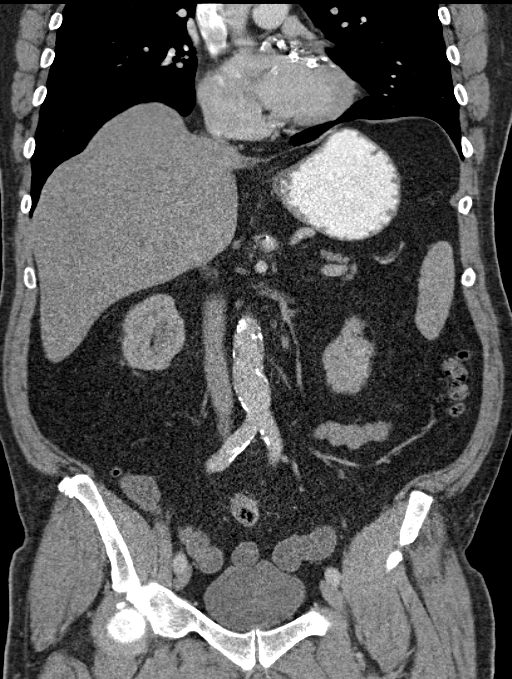

[15 of 46 positions shown; findings below may reference images not displayed]

FINDINGS: Lower chest:  Lung bases are unremarkable

Hepatobiliary: No calcified gallstones are noted within gallbladder.
Enhanced liver is unremarkable.

Pancreas: Enhanced pancreas is unremarkable.

Spleen: Enhanced spleen is unremarkable.

Adrenals/Urinary Tract: No adrenal gland mass. Enhanced kidneys are
symmetrical in size. There is lobulated renal contour. No
hydronephrosis or hydroureter. Delayed renal images shows bilateral
renal symmetrical excretion. There is a cyst in midpole of the right
kidney measures 7 mm. A cyst in lower pole of the left kidney
measures 9 mm.

Stomach/Bowel: Oral contrast material noted within stomach and
proximal small bowel. No gastric outlet obstruction. There are mild
fluid distended distal small bowel loops. Some fluid noted within
terminal ileum. Findings suspicious for distal enteritis or mild
ileus. No definite evidence of small bowel obstruction.

There is no pericecal inflammation. Normal appendix. Moderate stool
noted within right colon. Moderate stool noted within transverse
colon and descending colon. Sigmoid colon diverticula are noted in
proximal sigmoid colon. Descending colon diverticula are noted. No
evidence of acute diverticulitis. No acute colitis. No distal
colonic obstruction.

Vascular/Lymphatic: Atherosclerotic calcifications of abdominal
aorta and iliac arteries are noted. No aortic aneurysm. No
retroperitoneal or mesenteric adenopathy.

Reproductive: Prostate gland and seminal vesicles are unremarkable.

Other: Mild distended urinary bladder. No calcified calculi are
noted within urinary bladder. No bladder filling defects. No ascites
or free abdominal air.

There is periumbilical and supraumbilical ventral hernia containing
omental fat measures about 3.7 cm. No evidence of acute
complication. Best seen in sagittal image 105.

Musculoskeletal: Sagittal images of the spine shows degenerative
changes thoracolumbar spine. Significant disc space flattening with
mild anterior and moderate posterior spurring at L5-S1 level.
Moderate spinal canal stenosis at L5-S1 level. Degenerative changes
bilateral SI joints. No inguinal adenopathy is noted.
IMPRESSION: 1. There is mild distension with fluid of distal small bowel without
transition point in caliber. Findings suspicious for distal
enteritis or mild ileus. Less likely small bowel obstruction.
2. Normal appendix.  No pericecal inflammation.
3. Moderate stool throughout the colon.
4. Colonic diverticula are noted descending colon and proximal
sigmoid colon. No evidence of acute diverticulitis.
5. No hydronephrosis or hydroureter. Bilateral renal lobulated
contour.
6. Degenerative changes lumbar spine.
7. There is umbilical and supraumbilical ventral hernia containing
fat without evidence acute complication measure about 3.7 cm. Please
see sagittal image 105

## 2018-05-26 DIAGNOSIS — Z1389 Encounter for screening for other disorder: Secondary | ICD-10-CM | POA: Diagnosis not present

## 2018-05-26 DIAGNOSIS — E1165 Type 2 diabetes mellitus with hyperglycemia: Secondary | ICD-10-CM | POA: Diagnosis not present

## 2018-05-26 DIAGNOSIS — J310 Chronic rhinitis: Secondary | ICD-10-CM | POA: Diagnosis not present

## 2018-05-26 DIAGNOSIS — Z681 Body mass index (BMI) 19 or less, adult: Secondary | ICD-10-CM | POA: Diagnosis not present

## 2018-05-26 DIAGNOSIS — Z85828 Personal history of other malignant neoplasm of skin: Secondary | ICD-10-CM | POA: Diagnosis not present

## 2018-05-26 DIAGNOSIS — N186 End stage renal disease: Secondary | ICD-10-CM | POA: Diagnosis not present

## 2018-05-26 DIAGNOSIS — D485 Neoplasm of uncertain behavior of skin: Secondary | ICD-10-CM | POA: Diagnosis not present

## 2018-05-26 DIAGNOSIS — C50912 Malignant neoplasm of unspecified site of left female breast: Secondary | ICD-10-CM | POA: Diagnosis not present

## 2018-05-26 DIAGNOSIS — Z23 Encounter for immunization: Secondary | ICD-10-CM | POA: Diagnosis not present

## 2018-05-26 DIAGNOSIS — Z992 Dependence on renal dialysis: Secondary | ICD-10-CM | POA: Diagnosis not present

## 2018-05-26 DIAGNOSIS — Z170 Estrogen receptor positive status [ER+]: Secondary | ICD-10-CM | POA: Diagnosis not present

## 2018-05-26 DIAGNOSIS — Z0001 Encounter for general adult medical examination with abnormal findings: Secondary | ICD-10-CM | POA: Diagnosis not present

## 2018-05-26 DIAGNOSIS — I872 Venous insufficiency (chronic) (peripheral): Secondary | ICD-10-CM | POA: Diagnosis not present

## 2018-05-26 DIAGNOSIS — B353 Tinea pedis: Secondary | ICD-10-CM | POA: Diagnosis not present

## 2018-05-26 DIAGNOSIS — L820 Inflamed seborrheic keratosis: Secondary | ICD-10-CM | POA: Diagnosis not present

## 2018-05-26 DIAGNOSIS — L821 Other seborrheic keratosis: Secondary | ICD-10-CM | POA: Diagnosis not present

## 2018-05-26 DIAGNOSIS — L723 Sebaceous cyst: Secondary | ICD-10-CM | POA: Diagnosis not present

## 2018-05-26 DIAGNOSIS — C50911 Malignant neoplasm of unspecified site of right female breast: Secondary | ICD-10-CM | POA: Diagnosis not present

## 2018-05-26 DIAGNOSIS — L84 Corns and callosities: Secondary | ICD-10-CM | POA: Diagnosis not present

## 2018-05-26 DIAGNOSIS — B360 Pityriasis versicolor: Secondary | ICD-10-CM | POA: Diagnosis not present

## 2018-05-26 DIAGNOSIS — D225 Melanocytic nevi of trunk: Secondary | ICD-10-CM | POA: Diagnosis not present

## 2018-05-26 DIAGNOSIS — Z85820 Personal history of malignant melanoma of skin: Secondary | ICD-10-CM | POA: Diagnosis not present

## 2018-05-26 DIAGNOSIS — I890 Lymphedema, not elsewhere classified: Secondary | ICD-10-CM | POA: Diagnosis not present

## 2018-05-26 DIAGNOSIS — L97222 Non-pressure chronic ulcer of left calf with fat layer exposed: Secondary | ICD-10-CM | POA: Diagnosis not present

## 2018-05-26 DIAGNOSIS — I4892 Unspecified atrial flutter: Secondary | ICD-10-CM | POA: Diagnosis not present

## 2018-06-11 ENCOUNTER — Encounter: Payer: Self-pay | Admitting: Internal Medicine

## 2018-07-29 DIAGNOSIS — E1129 Type 2 diabetes mellitus with other diabetic kidney complication: Secondary | ICD-10-CM | POA: Diagnosis not present

## 2018-08-20 DIAGNOSIS — E1140 Type 2 diabetes mellitus with diabetic neuropathy, unspecified: Secondary | ICD-10-CM | POA: Diagnosis not present

## 2018-08-20 DIAGNOSIS — R070 Pain in throat: Secondary | ICD-10-CM | POA: Diagnosis not present

## 2018-08-20 DIAGNOSIS — Z6832 Body mass index (BMI) 32.0-32.9, adult: Secondary | ICD-10-CM | POA: Diagnosis not present

## 2018-08-20 DIAGNOSIS — E6609 Other obesity due to excess calories: Secondary | ICD-10-CM | POA: Diagnosis not present

## 2018-09-08 ENCOUNTER — Ambulatory Visit (INDEPENDENT_AMBULATORY_CARE_PROVIDER_SITE_OTHER): Payer: Self-pay | Admitting: *Deleted

## 2018-09-08 ENCOUNTER — Other Ambulatory Visit: Payer: Self-pay

## 2018-09-08 ENCOUNTER — Telehealth: Payer: Self-pay | Admitting: *Deleted

## 2018-09-08 DIAGNOSIS — Z1211 Encounter for screening for malignant neoplasm of colon: Secondary | ICD-10-CM

## 2018-09-08 MED ORDER — NA SULFATE-K SULFATE-MG SULF 17.5-3.13-1.6 GM/177ML PO SOLN: 1.0000 | Freq: Once | ORAL | 0 refills | Status: AC

## 2018-09-08 NOTE — Patient Instructions (Addendum)
Gabriel Schultz  26-Jun-1950 MRN: [id number]     Procedure Date: 11/04/2018 Time to register: 12:00 pm Place to register: [facility] Procedure Time: 1:00 pm Scheduled provider: Dr. Gala Romney    PREPARATION FOR COLONOSCOPY WITH SUPREP BOWEL PREP KIT  Note: Suprep Bowel Prep Kit is a split-dose (2day) regimen. Consumption of BOTH 6-ounce bottles is required for a complete prep.  Please notify us immediately if you are diabetic, take iron supplements, or if you are on Coumadin or any other blood thinners.  Please hold the following medications: See letter                                                                                                                                                  2 DAYS BEFORE PROCEDURE:  DATE: 11/02/2018  DAY: Sunday Begin clear liquid diet AFTER your lunch meal. NO SOLID FOODS after this point.  1 DAY BEFORE PROCEDURE:  DATE: 11/03/2018   DAY: Monday Continue clear liquids the entire day - NO SOLID FOOD.   Diabetic medications adjustments for today: See letter  At 6:00pm: Complete steps 1 through 4 below, using ONE (1) 6-ounce bottle, before going to bed. Step 1:  Pour ONE (1) 6-ounce bottle of SUPREP liquid into the mixing container.  Step 2:  Add cool drinking water to the 16 ounce line on the container and mix.  Note: Dilute the solution concentrate as directed prior to use. Step 3:  DRINK ALL the liquid in the container. Step 4:  You MUST drink an additional two (2) or more 16 ounce containers of water over the next one (1) hour.   Continue clear liquids.  DAY OF PROCEDURE:   DATE: 11/04/2018   DAY: Tuesday If you take medications for your heart, blood pressure, or breathing, you may take these medications.  Diabetic medications adjustments for today: See letter  5 hours before your procedure at : 8:00 am Step 1:  Pour ONE (1) 6-ounce bottle of SUPREP liquid into the mixing container.  Step 2:  Add cool drinking water to the 16  ounce line on the container and mix.  Note: Dilute the solution concentrate as directed prior to use. Step 3:  DRINK ALL the liquid in the container. Step 4:  You MUST drink an additional two (2) or more 16 ounce containers of water over the next one (1) hour. You MUST complete the final glass of water at least 3 hours before your colonoscopy. Nothing by mouth past 10:00 am  You may take your morning medications with sip of water unless we have instructed otherwise.    Please see below for Dietary Information.  CLEAR LIQUIDS INCLUDE:  Water Jello (NOT red in color)   Ice Popsicles (NOT red in color)   Tea (sugar ok, no milk/cream) Powdered fruit flavored drinks  Coffee (  sugar ok, no milk/cream) Gatorade/ Lemonade/ Kool-Aid  (NOT red in color)   Juice: apple, white grape, white cranberry Soft drinks  Clear bullion, consomme, broth (fat free beef/chicken/vegetable)  Carbonated beverages (any kind)  Strained chicken noodle soup Hard Candy   Remember: Clear liquids are liquids that will allow you to see your fingers on the other side of a clear glass. Be sure liquids are NOT red in color, and not cloudy, but CLEAR.  DO NOT EAT OR DRINK ANY OF THE FOLLOWING:  Dairy products of any kind   Cranberry juice Tomato juice / V8 juice   Grapefruit juice Orange juice     Red grape juice  Do not eat any solid foods, including such foods as: cereal, oatmeal, yogurt, fruits, vegetables, creamed soups, eggs, bread, crackers, pureed foods in a blender, etc.   HELPFUL HINTS FOR DRINKING PREP SOLUTION:   Make sure prep is extremely cold. Mix and refrigerate the the morning of the prep. You may also put in the freezer.   You may try mixing some Crystal Light or Country Time Lemonade if you prefer. Mix in small amounts; add more if necessary.  Try drinking through a straw  Rinse mouth with water or a mouthwash between glasses, to remove after-taste.  Try sipping on a cold beverage /ice/ popsicles  between glasses of prep.  Place a piece of sugar-free hard candy in mouth between glasses.  If you become nauseated, try consuming smaller amounts, or stretch out the time between glasses. Stop for 30-60 minutes, then slowly start back drinking.     OTHER INSTRUCTIONS  You will need a responsible adult at least 69 years of age to accompany you and drive you home. This person must remain in the waiting room during your procedure. The hospital will cancel your procedure if you do not have a responsible adult with you.   1. Wear loose fitting clothing that is easily removed. 2. Leave jewelry and other valuables at home.  3. Remove all body piercing jewelry and leave at home. 4. Total time from sign-in until discharge is approximately 2-3 hours. 5. You should go home directly after your procedure and rest. You can resume normal activities the day after your procedure. 6. The day of your procedure you should not:  Drive  Make legal decisions  Operate machinery  Drink alcohol  Return to work   You may call the office (Dept: [phone]) before 5:00pm, or page the doctor on call [phone]) after 5:00pm, for further instructions, if necessary.   Insurance Information YOU WILL NEED TO CHECK WITH YOUR INSURANCE COMPANY FOR THE BENEFITS OF COVERAGE YOU HAVE FOR THIS PROCEDURE.  UNFORTUNATELY, NOT ALL INSURANCE COMPANIES HAVE BENEFITS TO COVER ALL OR PART OF THESE TYPES OF PROCEDURES.  IT IS YOUR RESPONSIBILITY TO CHECK YOUR BENEFITS, HOWEVER, WE WILL BE GLAD TO ASSIST YOU WITH ANY CODES YOUR INSURANCE COMPANY MAY NEED.    PLEASE NOTE THAT MOST INSURANCE COMPANIES WILL NOT COVER A SCREENING COLONOSCOPY FOR PEOPLE UNDER THE AGE OF 50  IF YOU HAVE BCBS INSURANCE, YOU MAY HAVE BENEFITS FOR A SCREENING COLONOSCOPY BUT IF POLYPS ARE FOUND THE DIAGNOSIS WILL CHANGE AND THEN YOU MAY HAVE A DEDUCTIBLE THAT WILL NEED TO BE MET. SO PLEASE MAKE SURE YOU CHECK YOUR BENEFITS FOR A SCREENING  COLONOSCOPY AS WELL AS A DIAGNOSTIC COLONOSCOPY.

## 2018-09-08 NOTE — Progress Notes (Signed)
Gastroenterology Pre-Procedure Review  Request Date: 09/08/2018 Requesting Physician: Dr. Gerarda Fraction @ [facility]/Last TCS done 2008 by Dr. Arnoldo Morale, Hyperplastic polyps  PATIENT REVIEW QUESTIONS: The patient responded to the following health history questions as indicated:    1. Diabetes Melitis: Yes 2. Joint replacements in the past 12 months: No 3. Major health problems in the past 3 months: No 4. Has an artificial valve or MVP: No 5. Has a defibrillator: No 6. Has been advised in past to take antibiotics in advance of a procedure like teeth cleaning: No 7. Family history of colon cancer: Yes, Mother Age: 59 8. Alcohol Use: Yes occasionally 9. History of sleep apnea: No  10. History of coronary artery or other vascular stents placed within the last 12 months: No, had placed 4 to 5 years ago per pt 11. History of any prior anesthesia complications: No    MEDICATIONS & ALLERGIES:    Patient reports the following regarding taking any blood thinners:   Plavix? No Aspirin? Yes Coumadin? No Brilinta? No Xarelto? No Eliquis? No Pradaxa? No Savaysa? No Effient? No  Patient confirms/reports the following medications:  Current Outpatient Medications  Medication Sig Dispense Refill  . allopurinol (ZYLOPRIM) 100 MG tablet Take 100 mg by mouth daily.    [name] amLODipine (NORVASC) 10 MG tablet Take 10 mg by mouth daily.    [name] aspirin EC 81 MG tablet Take 81 mg by mouth daily.    [name] Dextromethorphan-guaiFENesin (MUCINEX DM MAXIMUM STRENGTH) 60-1200 MG TB12 Take by mouth daily.    . famotidine (PEPCID) 10 MG tablet Take 10 mg by mouth 2 (two) times daily.    [name] glipiZIDE (GLUCOTROL XL) 5 MG 24 hr tablet Take 5 mg by mouth every morning.    . metFORMIN (GLUCOPHAGE) 1000 MG tablet Take 1,000 mg by mouth 2 (two) times daily.    . metoprolol succinate (TOPROL-XL) 50 MG 24 hr tablet Take 50 mg by mouth daily.     . pantoprazole (PROTONIX) 40 MG tablet Take 40 mg by mouth daily.    . pravastatin (PRAVACHOL)  20 MG tablet Take 20 mg by mouth every evening.     . Sodium Chloride (NASAL MIST) 0.9 % AERS Inhale into the lungs daily.    . bacitracin 500 UNIT/GM ointment Apply 1 application topically 2 (two) times daily. (Patient not taking: Reported on 09/08/2018) 15 g 0  . fluticasone (FLONASE) 50 MCG/ACT nasal spray Place 2 sprays into both nostrils daily as needed for allergies or rhinitis.     . naproxen (NAPROSYN) 500 MG tablet Take 500 mg by mouth daily as needed for mild pain.     . polyethylene glycol-electrolytes (TRILYTE) 420 g solution Take 4,000 mLs by mouth as directed. (Patient not taking: Reported on 12/04/2016) 4000 mL 0  . ranitidine (ZANTAC) 150 MG tablet Take 1 tablet (150 mg total) by mouth 2 (two) times daily. (Patient not taking: Reported on 09/08/2018) 60 tablet 0   No current facility-administered medications for this visit.     Patient confirms/reports the following allergies:  No Known Allergies  No orders of the defined types were placed in this encounter.   AUTHORIZATION INFORMATION Primary Insurance: [employer] ,  ID #[id number]: ,  Group #: [id number] Pre-Cert / Auth required: Not required   SCHEDULE INFORMATION: Procedure has been scheduled as follows:  Date: 11/04/2018, Time:1:00 Location: [facility] with Dr. Gala Romney  This Gastroenterology Pre-Precedure Review Form is being routed to the following provider(s): Walden Field, NP

## 2018-09-08 NOTE — Telephone Encounter (Signed)
Called and completed nurse triage visit by phone.  Discussed instructions, pre-procedure acknowledgements, and COVID testing with pt.  Pt voiced understanding.  Pt aware that we are mailing out all info that was discussed by phone.

## 2018-09-09 ENCOUNTER — Encounter: Payer: Self-pay | Admitting: *Deleted

## 2018-09-09 NOTE — Progress Notes (Signed)
Letter mailed to pt with diabetes medication adjustments.

## 2018-09-09 NOTE — Progress Notes (Signed)
Ok to schedule.  DM Meds: none the morning of  On prep day: Check CBG ac and hs (as able) as well as if the patient feels like their blood sugar is off. Can use soda, juice (that's in [place]) as needed for any low blood sugar.  Check CBG on arrival to endo unit.

## 2018-09-09 NOTE — Addendum Note (Signed)
Addended by: Metro Kung on: 09/09/2018 09:30 AM   Modules accepted: Orders, SmartSet

## 2018-10-28 DIAGNOSIS — E7849 Other hyperlipidemia: Secondary | ICD-10-CM | POA: Diagnosis not present

## 2018-10-28 DIAGNOSIS — I10 Essential (primary) hypertension: Secondary | ICD-10-CM | POA: Diagnosis not present

## 2018-10-28 DIAGNOSIS — Z6832 Body mass index (BMI) 32.0-32.9, adult: Secondary | ICD-10-CM | POA: Diagnosis not present

## 2018-10-28 DIAGNOSIS — Z0000 Encounter for general adult medical examination without abnormal findings: Secondary | ICD-10-CM | POA: Diagnosis not present

## 2018-10-31 ENCOUNTER — Other Ambulatory Visit (HOSPITAL_COMMUNITY)
Admission: RE | Admit: 2018-10-31 | Discharge: 2018-10-31 | Disposition: A | Discharge status: Home / Self Care | Payer: PPO | Source: Ambulatory Visit | Attending: Internal Medicine | Admitting: Internal Medicine

## 2018-10-31 ENCOUNTER — Other Ambulatory Visit: Payer: Self-pay

## 2018-10-31 DIAGNOSIS — Z20828 Contact with and (suspected) exposure to other viral communicable diseases: Secondary | ICD-10-CM | POA: Diagnosis not present

## 2018-10-31 DIAGNOSIS — Z01812 Encounter for preprocedural laboratory examination: Secondary | ICD-10-CM | POA: Insufficient documentation

## 2018-10-31 LAB — SARS CORONAVIRUS 2 (TAT 6-24 HRS): SARS Coronavirus 2: NEGATIVE

## 2018-11-04 ENCOUNTER — Other Ambulatory Visit: Payer: Self-pay

## 2018-11-04 ENCOUNTER — Ambulatory Visit (HOSPITAL_COMMUNITY)
Admission: RE | Admit: 2018-11-04 | Discharge: 2018-11-04 | Disposition: A | Discharge status: Home / Self Care | Payer: PPO | Attending: Internal Medicine | Admitting: Internal Medicine

## 2018-11-04 ENCOUNTER — Encounter (HOSPITAL_COMMUNITY)
Admission: RE | Disposition: A | Discharge status: Home / Self Care | Payer: Self-pay | Source: Home / Self Care | Attending: Internal Medicine

## 2018-11-04 DIAGNOSIS — Z955 Presence of coronary angioplasty implant and graft: Secondary | ICD-10-CM | POA: Diagnosis not present

## 2018-11-04 DIAGNOSIS — K635 Polyp of colon: Secondary | ICD-10-CM | POA: Diagnosis not present

## 2018-11-04 DIAGNOSIS — Z7984 Long term (current) use of oral hypoglycemic drugs: Secondary | ICD-10-CM | POA: Insufficient documentation

## 2018-11-04 DIAGNOSIS — E119 Type 2 diabetes mellitus without complications: Secondary | ICD-10-CM | POA: Insufficient documentation

## 2018-11-04 DIAGNOSIS — Z87891 Personal history of nicotine dependence: Secondary | ICD-10-CM | POA: Insufficient documentation

## 2018-11-04 DIAGNOSIS — D122 Benign neoplasm of ascending colon: Secondary | ICD-10-CM | POA: Insufficient documentation

## 2018-11-04 DIAGNOSIS — I2510 Atherosclerotic heart disease of native coronary artery without angina pectoris: Secondary | ICD-10-CM | POA: Insufficient documentation

## 2018-11-04 DIAGNOSIS — K5730 Diverticulosis of large intestine without perforation or abscess without bleeding: Secondary | ICD-10-CM | POA: Diagnosis not present

## 2018-11-04 DIAGNOSIS — Z79899 Other long term (current) drug therapy: Secondary | ICD-10-CM | POA: Insufficient documentation

## 2018-11-04 DIAGNOSIS — K219 Gastro-esophageal reflux disease without esophagitis: Secondary | ICD-10-CM | POA: Insufficient documentation

## 2018-11-04 DIAGNOSIS — E7800 Pure hypercholesterolemia, unspecified: Secondary | ICD-10-CM | POA: Diagnosis not present

## 2018-11-04 DIAGNOSIS — M109 Gout, unspecified: Secondary | ICD-10-CM | POA: Diagnosis not present

## 2018-11-04 DIAGNOSIS — Z1211 Encounter for screening for malignant neoplasm of colon: Secondary | ICD-10-CM | POA: Diagnosis not present

## 2018-11-04 DIAGNOSIS — D124 Benign neoplasm of descending colon: Secondary | ICD-10-CM | POA: Insufficient documentation

## 2018-11-04 DIAGNOSIS — Z7982 Long term (current) use of aspirin: Secondary | ICD-10-CM | POA: Insufficient documentation

## 2018-11-04 DIAGNOSIS — Z800 Family history of malignant neoplasm of digestive organs: Secondary | ICD-10-CM | POA: Insufficient documentation

## 2018-11-04 DIAGNOSIS — I10 Essential (primary) hypertension: Secondary | ICD-10-CM | POA: Insufficient documentation

## 2018-11-04 HISTORY — PX: COLONOSCOPY: SHX5424

## 2018-11-04 HISTORY — PX: POLYPECTOMY: SHX5525

## 2018-11-04 LAB — GLUCOSE, CAPILLARY: Glucose-Capillary: 161 mg/dL — ABNORMAL HIGH (ref 70–99)

## 2018-11-04 SURGERY — COLONOSCOPY
Anesthesia: Moderate Sedation

## 2018-11-04 MED ORDER — MIDAZOLAM HCL 5 MG/5ML IJ SOLN
INTRAMUSCULAR | Status: AC
  Filled 2018-11-04: qty 10

## 2018-11-04 MED ORDER — MEPERIDINE HCL 50 MG/ML IJ SOLN
INTRAMUSCULAR | Status: AC
  Filled 2018-11-04: qty 1

## 2018-11-04 MED ORDER — STERILE WATER FOR IRRIGATION IR SOLN
Status: DC | PRN
  Administered 2018-11-04: 1.5 mL

## 2018-11-04 MED ORDER — SODIUM CHLORIDE 0.9 % IV SOLN
INTRAVENOUS | Status: DC
  Administered 2018-11-04: 12:00:00 via INTRAVENOUS

## 2018-11-04 MED ORDER — ONDANSETRON HCL 4 MG/2ML IJ SOLN
INTRAMUSCULAR | Status: AC
  Filled 2018-11-04: qty 2

## 2018-11-04 MED ORDER — ONDANSETRON HCL 4 MG/2ML IJ SOLN
INTRAMUSCULAR | Status: DC | PRN
  Administered 2018-11-04: 4 mg via INTRAVENOUS

## 2018-11-04 MED ORDER — MEPERIDINE HCL 100 MG/ML IJ SOLN
INTRAMUSCULAR | Status: DC | PRN
  Administered 2018-11-04: 25 mg via INTRAVENOUS

## 2018-11-04 MED ORDER — MIDAZOLAM HCL 5 MG/5ML IJ SOLN
INTRAMUSCULAR | Status: DC | PRN
  Administered 2018-11-04 (×2): 2 mg via INTRAVENOUS
  Administered 2018-11-04 (×2): 1 mg via INTRAVENOUS

## 2018-11-04 NOTE — Op Note (Signed)
[facility] Patient Name: Gabriel Schultz Procedure Date: 11/04/2018 11:43 AM MRN: [id number] Date of Birth: [birth year] Attending MD: Norvel Richards , MD CSN: [id number] Age: 68 Admit Type: Outpatient Procedure:                Colonoscopy Indications:              Screening for colorectal malignant neoplasm Providers:                Norvel Richards, MD, Charlsie Quest. Theda Sers RN, RN,                            Aram Candela Referring MD:              Medicines:                Midazolam 6 mg IV, Meperidine 25 mg IV Complications:            No immediate complications. Estimated Blood Loss:     Estimated blood loss was minimal. Procedure:                Pre-Anesthesia Assessment:                           - Prior to the procedure, a History and Physical                            was performed, and patient medications and                            allergies were reviewed. The patient's tolerance of                            previous anesthesia was also reviewed. The risks                            and benefits of the procedure and the sedation                            options and risks were discussed with the patient.                            All questions were answered, and informed consent                            was obtained. Prior Anticoagulants: The patient has                            taken no previous anticoagulant or antiplatelet                            agents. ASA Grade Assessment: II - A patient with                            mild systemic disease. After reviewing the risks  and benefits, the patient was deemed in                            satisfactory condition to undergo the procedure.                           After obtaining informed consent, the colonoscope                            was passed under direct vision. Throughout the                            procedure, the patient's blood pressure, pulse, and                 oxygen saturations were monitored continuously. The                            CF-[id number] ([id number]) scope was introduced through                            the anus and advanced to the the cecum, identified                            by appendiceal orifice and ileocecal valve. The                            colonoscopy was performed without difficulty. The                            patient tolerated the procedure well. The quality                            of the bowel preparation was adequate. The                            ileocecal valve, appendiceal orifice, and rectum                            were photographed. The entire colon was well                            visualized. Scope In: 12:15:03 PM Scope Out: 12:49:02 PM Scope Withdrawal Time: 0 hours 26 minutes 11 seconds  Total Procedure Duration: 0 hours 33 minutes 59 seconds  Findings:      The perianal and digital rectal examinations were normal.      Multiple small and large-mouthed diverticula were found in the sigmoid       colon and descending colon.      Six semi-pedunculated polyps were found in the descending colon and       ascending colon. The polyps were 5 to 11 mm in size. These polyps were       removed with a cold snare. Resection and retrieval were complete.       Estimated blood loss was minimal.      The exam was otherwise without  abnormality on direct and retroflexion       views. The largest polyp in the descending segment?"11 mm cold snared. It       did not appear to be quite as large 11 mm until the snare pulled       through. There was one area of residual polyp. There was minimal       bleeding. I placed a hemostasis clip at the polypectomy base and cold       snare the remainder of this lesion. Impression:               - Diverticulosis in the sigmoid colon and in the                            descending colon.                           - Six 5 to 11 mm polyps in the descending colon  and                            in the ascending colon, removed with a cold snare.                            Resected and retrieved. Stasis clip placed as                            described.                           - The examination was otherwise normal on direct                            and retroflexion views. Moderate Sedation:      Moderate (conscious) sedation was administered by the endoscopy nurse       and supervised by the endoscopist. The following parameters were       monitored: oxygen saturation, heart rate, blood pressure, respiratory       rate, EKG, adequacy of pulmonary ventilation, and response to care.       Total physician intraservice time was 42 minutes. Recommendation:           - Patient has a contact number available for                            emergencies. The signs and symptoms of potential                            delayed complications were discussed with the                            patient. Return to normal activities tomorrow.                            Written discharge instructions were provided to the                            patient.                           -  Repeat colonoscopy date to be determined after                            pending pathology results are reviewed for                            surveillance based on pathology results.                           - Return to GI clinic (date not yet determined). No                            MRI until clip gone. Procedure Code(s):        --- Professional ---                           [phone], Colonoscopy, flexible; with removal of                            tumor(s), polyp(s), or other lesion(s) by snare                            technique                           99153, Moderate sedation; each additional 15                            minutes intraservice time                           99153, Moderate sedation; each additional 15                            minutes intraservice time                            G0500, Moderate sedation services provided by the                            same physician or other qualified health care                            professional performing a gastrointestinal                            endoscopic service that sedation supports,                            requiring the presence of an independent trained                            observer to assist in the monitoring of the                            patient's level of consciousness  and physiological                            status; initial 15 minutes of intra-service time;                            patient age 56 years or older (additional time may                            be reported with [phone], as appropriate) Diagnosis Code(s):        --- Professional ---                           Z12.11, Encounter for screening for malignant                            neoplasm of colon                           K63.5, Polyp of colon                           K57.30, Diverticulosis of large intestine without                            perforation or abscess without bleeding CPT copyright 2019 American Medical Association. All rights reserved. The codes documented in this report are preliminary and upon coder review may  be revised to meet current compliance requirements. Cristopher Estimable. Alexah Kivett, MD Norvel Richards, MD 11/04/2018 12:57:06 PM This report has been signed electronically. Number of Addenda: 0

## 2018-11-04 NOTE — OR Nursing (Signed)
Called patient but unable to reach him.

## 2018-11-04 NOTE — Discharge Instructions (Signed)
Colonoscopy Discharge Instructions  Read the instructions outlined below and refer to this sheet in the next few weeks. These discharge instructions provide you with general information on caring for yourself after you leave the hospital. Your doctor may also give you specific instructions. While your treatment has been planned according to the most current medical practices available, unavoidable complications occasionally occur. If you have any problems or questions after discharge, call Dr. Gala Romney at [phone]. ACTIVITY  You may resume your regular activity, but move at a slower pace for the next 24 hours.   Take frequent rest periods for the next 24 hours.   Walking will help get rid of the air and reduce the bloated feeling in your belly (abdomen).   No driving for 24 hours (because of the medicine (anesthesia) used during the test).    Do not sign any important legal documents or operate any machinery for 24 hours (because of the anesthesia used during the test).  NUTRITION  Drink plenty of fluids.   You may resume your normal diet as instructed by your doctor.   Begin with a light meal and progress to your normal diet. Heavy or fried foods are harder to digest and may make you feel sick to your stomach (nauseated).   Avoid alcoholic beverages for 24 hours or as instructed.  MEDICATIONS  You may resume your normal medications unless your doctor tells you otherwise.  WHAT YOU CAN EXPECT TODAY  Some feelings of bloating in the abdomen.   Passage of more gas than usual.   Spotting of blood in your stool or on the toilet paper.  IF YOU HAD POLYPS REMOVED DURING THE COLONOSCOPY:  No aspirin products for 7 days or as instructed.   No alcohol for 7 days or as instructed.   Eat a soft diet for the next 24 hours.  FINDING OUT THE RESULTS OF YOUR TEST Not all test results are available during your visit. If your test results are not back during the visit, make an appointment  with your caregiver to find out the results. Do not assume everything is normal if you have not heard from your caregiver or the medical facility. It is important for you to follow up on all of your test results.  SEEK IMMEDIATE MEDICAL ATTENTION IF:  You have more than a spotting of blood in your stool.   Your belly is swollen (abdominal distention).   You are nauseated or vomiting.   You have a temperature over 101.   You have abdominal pain or discomfort that is severe or gets worse throughout the day.    Colon polyp and diverticulosis information provided  No MRI until clip gone  Further recommendations to follow pending review of pathology report  I called to speak to Gabriel Schultz, brother, [phone] number   Colon Polyps  Polyps are tissue growths inside the body. Polyps can grow in many places, including the large intestine (colon). A polyp may be a round bump or a mushroom-shaped growth. You could have one polyp or several. Most colon polyps are noncancerous (benign). However, some colon polyps can become cancerous over time. Finding and removing the polyps early can help prevent this. What are the causes? The exact cause of colon polyps is not known. What increases the risk? You are more likely to develop this condition if you:  Have a family history of colon cancer or colon polyps.  Are older than 56 or older than 45 if you are African  American.  Have inflammatory bowel disease, such as ulcerative colitis or Crohn's disease.  Have certain hereditary conditions, such as: ? Familial adenomatous polyposis. ? Lynch syndrome. ? Turcot syndrome. ? Peutz-Jeghers syndrome.  Are overweight.  Smoke cigarettes.  Do not get enough exercise.  Drink too much alcohol.  Eat a diet that is high in fat and red meat and low in fiber.  Had childhood cancer that was treated with abdominal radiation. What are the signs or symptoms? Most polyps do not cause  symptoms. If you have symptoms, they may include:  Blood coming from your rectum when having a bowel movement.  Blood in your stool. The stool may look dark red or black.  Abdominal pain.  A change in bowel habits, such as constipation or diarrhea. How is this diagnosed? This condition is diagnosed with a colonoscopy. This is a procedure in which a lighted, flexible scope is inserted into the anus and then passed into the colon to examine the area. Polyps are sometimes found when a colonoscopy is done as part of routine cancer screening tests. How is this treated? Treatment for this condition involves removing any polyps that are found. Most polyps can be removed during a colonoscopy. Those polyps will then be tested for cancer. Additional treatment may be needed depending on the results of testing. Follow these instructions at home: Lifestyle  Maintain a healthy weight, or lose weight if recommended by your health care provider.  Exercise every day or as told by your health care provider.  Do not use any products that contain nicotine or tobacco, such as cigarettes and e-cigarettes. If you need help quitting, ask your health care provider.  If you drink alcohol, limit how much you have: ? 0-1 drink a day for women. ? 0-2 drinks a day for men.  Be aware of how much alcohol is in your drink. In the [place] one drink equals one 12 oz bottle of beer (355 mL), one 5 oz glass of wine (148 mL), or one 1 oz shot of hard liquor (44 mL). Eating and drinking   Eat foods that are high in fiber, such as fruits, vegetables, and whole grains.  Eat foods that are high in calcium and vitamin D, such as milk, cheese, yogurt, eggs, liver, fish, and broccoli.  Limit foods that are high in fat, such as fried foods and desserts.  Limit the amount of red meat and processed meat you eat, such as hot dogs, sausage, bacon, and lunch meats. General instructions  Keep all follow-up visits as told by your  health care provider. This is important. ? This includes having regularly scheduled colonoscopies. ? Talk to your health care provider about when you need a colonoscopy. Contact a health care provider if:  You have new or worsening bleeding during a bowel movement.  You have new or increased blood in your stool.  You have a change in bowel habits.  You lose weight for no known reason. Summary  Polyps are tissue growths inside the body. Polyps can grow in many places, including the colon.  Most colon polyps are noncancerous (benign), but some can become cancerous over time.  This condition is diagnosed with a colonoscopy.  Treatment for this condition involves removing any polyps that are found. Most polyps can be removed during a colonoscopy. This information is not intended to replace advice given to you by your health care provider. Make sure you discuss any questions you have with your health care provider. Document  Released: 11/02/2003 Document Revised: 05/23/2017 Document Reviewed: 05/23/2017 Elsevier Patient Education  [place].  Diverticulosis  Diverticulosis is a condition that develops when small pouches (diverticula) form in the wall of the large intestine (colon). The colon is where water is absorbed and stool is formed. The pouches form when the inside layer of the colon pushes through weak spots in the outer layers of the colon. You may have a few pouches or many of them. What are the causes? The cause of this condition is not known. What increases the risk? The following factors may make you more likely to develop this condition:  Being older than age 91. Your risk for this condition increases with age. Diverticulosis is rare among people younger than age 57. By age 10, many people have it.  Eating a low-fiber diet.  Having frequent constipation.  Being overweight.  Not getting enough exercise.  Smoking.  Taking over-the-counter pain medicines,  like aspirin and ibuprofen.  Having a family history of diverticulosis. What are the signs or symptoms? In most people, there are no symptoms of this condition. If you do have symptoms, they may include:  Bloating.  Cramps in the abdomen.  Constipation or diarrhea.  Pain in the lower left side of the abdomen. How is this diagnosed? This condition is most often diagnosed during an exam for other colon problems. Because diverticulosis usually has no symptoms, it often cannot be diagnosed independently. This condition may be diagnosed by:  Using a flexible scope to examine the colon (colonoscopy).  Taking an X-ray of the colon after dye has been put into the colon (barium enema).  Doing a CT scan. How is this treated? You may not need treatment for this condition if you have never developed an infection related to diverticulosis. If you have had an infection before, treatment may include:  Eating a high-fiber diet. This may include eating more fruits, vegetables, and grains.  Taking a fiber supplement.  Taking a live bacteria supplement (probiotic).  Taking medicine to relax your colon.  Taking antibiotic medicines. Follow these instructions at home:  Drink 6-8 glasses of water or more each day to prevent constipation.  Try not to strain when you have a bowel movement.  If you have had an infection before: ? Eat more fiber as directed by your health care provider or your diet and nutrition specialist (dietitian). ? Take a fiber supplement or probiotic, if your health care provider approves.  Take over-the-counter and prescription medicines only as told by your health care provider.  If you were prescribed an antibiotic, take it as told by your health care provider. Do not stop taking the antibiotic even if you start to feel better.  Keep all follow-up visits as told by your health care provider. This is important. Contact a health care provider if:  You have pain in  your abdomen.  You have bloating.  You have cramps.  You have not had a bowel movement in 3 days. Get help right away if:  Your pain gets worse.  Your bloating becomes very bad.  You have a fever or chills, and your symptoms suddenly get worse.  You vomit.  You have bowel movements that are bloody or black.  You have bleeding from your rectum. Summary  Diverticulosis is a condition that develops when small pouches (diverticula) form in the wall of the large intestine (colon).  You may have a few pouches or many of them.  This condition is most often  diagnosed during an exam for other colon problems.  If you have had an infection related to diverticulosis, treatment may include increasing the fiber in your diet, taking supplements, or taking medicines. This information is not intended to replace advice given to you by your health care provider. Make sure you discuss any questions you have with your health care provider. Document Released: 11/03/2003 Document Revised: 01/18/2017 Document Reviewed: 12/26/2015 Elsevier Patient Education  2020 [employer].

## 2018-11-04 NOTE — H&P (Signed)
@LOGO@   Primary Care Physician:  Fusco, Lawrence, MD Primary Gastroenterologist:  Dr.   Pre-Procedure History & Physical: HPI:  Gabriel Schultz is a 68 y.o. male is here for a screening colonoscopy.   Past Medical History:  Diagnosis Date  . Coronary artery disease   . Diabetes mellitus without complication (HCC)   . GERD (gastroesophageal reflux disease)   . Gout   . High cholesterol   . Hypertension     Past Surgical History:  Procedure Laterality Date  . CORONARY STENT PLACEMENT    . HERNIA REPAIR      Prior to Admission medications   Medication Sig Start Date End Date Taking? Authorizing Provider  allopurinol (ZYLOPRIM) 100 MG tablet Take 100 mg by mouth every other day. In the evening   Yes [provider]  ALPRAZolam (XANAX) 0.5 MG tablet Take 0.25-0.5 mg by mouth 3 (three) times daily as needed for anxiety.  10/28/18  Yes [provider]  amLODipine (NORVASC) 10 MG tablet Take 10 mg by mouth every evening.    Yes [provider]  aspirin EC 81 MG tablet Take 81 mg by mouth every evening.    Yes [provider]  fluticasone (FLONASE) 50 MCG/ACT nasal spray Place 1-2 sprays into both nostrils 2 (two) times daily as needed for allergies or rhinitis.    Yes [provider]  glipiZIDE (GLUCOTROL XL) 5 MG 24 hr tablet Take 5 mg by mouth every morning. 11/05/16  Yes [provider]  metFORMIN (GLUCOPHAGE) 1000 MG tablet Take 1,000 mg by mouth 2 (two) times daily. 11/05/16  Yes [provider]  methocarbamol (ROBAXIN) 750 MG tablet Take 750 mg by mouth 2 (two) times daily as needed for muscle spasms (back pain).   Yes [provider]  metoprolol succinate (TOPROL-XL) 50 MG 24 hr tablet Take 50 mg by mouth daily.  11/27/16  Yes [provider]  MUCINEX MAXIMUM STRENGTH 1200 MG TB12 Take 1,200 mg by mouth 2 (two) times daily.   Yes [provider]  naproxen (NAPROSYN) 500 MG tablet Take 500 mg by  mouth 2 (two) times daily as needed (back pain/neck stiffness).    Yes [provider]  omeprazole (PRILOSEC) 20 MG capsule Take 20 mg by mouth every evening.   Yes [provider]  pantoprazole (PROTONIX) 40 MG tablet Take 40 mg by mouth daily before breakfast.    Yes [provider]  pravastatin (PRAVACHOL) 20 MG tablet Take 20 mg by mouth every evening.    Yes [provider]  Sodium Chloride (NASAL MIST) 0.9 % AERS Place 1-2 sprays into the nose 3 (three) times daily as needed (congestion/stuffy nose.).    Yes [provider]  SUPREP BOWEL PREP KIT 17.5-3.13-1.6 GM/177ML SOLN Take 354 mLs by mouth once. 10/29/18  Yes [provider]    Allergies as of 09/09/2018  . (No Known Allergies)    Family History  Problem Relation Age of Onset  . Diabetes Mother   . Diabetes Father     Social History   Socioeconomic History  . Marital status: Married    Spouse name: Not on file  . Number of children: Not on file  . Years of education: Not on file  . Highest education level: Not on file  Occupational History  . Not on file  Social Needs  . Financial resource strain: Not on file  . Food insecurity    Worry: Not on file      Inability: Not on file  . Transportation needs    Medical: Not on file    Non-medical: Not on file  Tobacco Use  . Smoking status: Former Smoker    Packs/day: 1.00    Years: 30.00    Pack years: 30.00    Types: Cigarettes    Quit date: 02/20/1988    Years since quitting: 30.7  . Smokeless tobacco: Former User    Types: Chew    Quit date: 03/18/2016  Substance and Sexual Activity  . Alcohol use: Yes    Comment: occasionally  . Drug use: No  . Sexual activity: Not on file  Lifestyle  . Physical activity    Days per week: Not on file    Minutes per session: Not on file  . Stress: Not on file  Relationships  . Social connections    Talks on phone: Not on file    Gets together: Not on file    Attends  religious service: Not on file    Active member of club or organization: Not on file    Attends meetings of clubs or organizations: Not on file    Relationship status: Not on file  . Intimate partner violence    Fear of current or ex partner: Not on file    Emotionally abused: Not on file    Physically abused: Not on file    Forced sexual activity: Not on file  Other Topics Concern  . Not on file  Social History Narrative  . Not on file    Review of Systems: See HPI, otherwise negative ROS  Physical Exam: BP (!) 146/87   Pulse 76   Temp 97.8 F (36.6 C) (Oral)   Resp 19   Ht 6' 1" (1.854 m)   Wt 113.9 kg   SpO2 97%   BMI 33.12 kg/m  General:   Alert,  Well-developed, well-nourished, pleasant and cooperative in NAD Lungs:  Clear throughout to auscultation.   No wheezes, crackles, or rhonchi. No acute distress. Heart:  Regular rate and rhythm; no murmurs, clicks, rubs,  or gallops. Abdomen:  Soft, nontender and nondistended. No masses, hepatosplenomegaly or hernias noted. Normal bowel sounds, without guarding, and without rebound.   Msk:  Symmetrical without gross deformities. Normal posture. Pulses:  Normal pulses noted. Extremities:  Without clubbing or edema. Impression/Plan: Gabriel Schultz is now here to undergo a screening colonoscopy.  Positive family history of colon cancer in his mother at age 60  Risks, benefits, limitations, imponderables and alternatives regarding colonoscopy have been reviewed with the patient. Questions have been answered. All parties agreeable.     Notice:  This dictation was prepared with Dragon dictation along with smaller phrase technology. Any transcriptional errors that result from this process are unintentional and may not be corrected upon review.  

## 2018-11-05 ENCOUNTER — Encounter: Payer: Self-pay | Admitting: Internal Medicine

## 2018-11-05 LAB — SURGICAL PATHOLOGY

## 2018-11-06 ENCOUNTER — Encounter (HOSPITAL_COMMUNITY): Payer: Self-pay | Admitting: Internal Medicine

## 2019-01-09 ENCOUNTER — Other Ambulatory Visit: Payer: Self-pay

## 2019-01-09 DIAGNOSIS — Z20822 Contact with and (suspected) exposure to covid-19: Secondary | ICD-10-CM

## 2019-01-12 LAB — NOVEL CORONAVIRUS, NAA: SARS-CoV-2, NAA: NOT DETECTED

## 2019-01-14 ENCOUNTER — Telehealth: Payer: Self-pay

## 2019-01-14 ENCOUNTER — Telehealth: Payer: Self-pay | Admitting: *Deleted

## 2019-01-14 NOTE — Telephone Encounter (Signed)
Pt given result of COVID test; he verbalized understanding.

## 2019-01-14 NOTE — Telephone Encounter (Signed)
Patient given negative result and verbalized understanding  

## 2019-02-24 ENCOUNTER — Other Ambulatory Visit (HOSPITAL_COMMUNITY): Payer: Self-pay | Admitting: Physician Assistant

## 2019-02-24 DIAGNOSIS — R5383 Other fatigue: Secondary | ICD-10-CM

## 2019-02-24 DIAGNOSIS — Z1389 Encounter for screening for other disorder: Secondary | ICD-10-CM | POA: Diagnosis not present

## 2019-02-24 DIAGNOSIS — E119 Type 2 diabetes mellitus without complications: Secondary | ICD-10-CM | POA: Diagnosis not present

## 2019-02-24 DIAGNOSIS — R079 Chest pain, unspecified: Secondary | ICD-10-CM

## 2019-02-24 DIAGNOSIS — Z6832 Body mass index (BMI) 32.0-32.9, adult: Secondary | ICD-10-CM | POA: Diagnosis not present

## 2019-02-25 ENCOUNTER — Encounter: Payer: Self-pay | Admitting: Internal Medicine

## 2019-02-25 ENCOUNTER — Ambulatory Visit (HOSPITAL_COMMUNITY)
Admission: RE | Admit: 2019-02-25 | Discharge: 2019-02-25 | Disposition: A | Discharge status: Home / Self Care | Payer: PPO | Source: Ambulatory Visit | Attending: Physician Assistant | Admitting: Physician Assistant

## 2019-02-25 ENCOUNTER — Other Ambulatory Visit: Payer: Self-pay

## 2019-02-25 ENCOUNTER — Encounter (HOSPITAL_COMMUNITY): Payer: Self-pay

## 2019-02-25 DIAGNOSIS — R079 Chest pain, unspecified: Secondary | ICD-10-CM

## 2019-02-25 DIAGNOSIS — R5383 Other fatigue: Secondary | ICD-10-CM | POA: Diagnosis not present

## 2019-02-27 DIAGNOSIS — R52 Pain, unspecified: Secondary | ICD-10-CM | POA: Diagnosis not present

## 2019-02-27 DIAGNOSIS — R110 Nausea: Secondary | ICD-10-CM | POA: Diagnosis not present

## 2019-02-27 DIAGNOSIS — R1084 Generalized abdominal pain: Secondary | ICD-10-CM | POA: Diagnosis not present

## 2019-02-27 DIAGNOSIS — R112 Nausea with vomiting, unspecified: Secondary | ICD-10-CM | POA: Diagnosis not present

## 2019-02-27 DIAGNOSIS — I10 Essential (primary) hypertension: Secondary | ICD-10-CM | POA: Diagnosis not present

## 2019-03-03 ENCOUNTER — Ambulatory Visit: Payer: PPO | Attending: Internal Medicine

## 2019-03-03 ENCOUNTER — Telehealth: Payer: Self-pay | Admitting: Cardiovascular Disease

## 2019-03-03 ENCOUNTER — Other Ambulatory Visit: Payer: Self-pay

## 2019-03-03 DIAGNOSIS — Z20822 Contact with and (suspected) exposure to covid-19: Secondary | ICD-10-CM

## 2019-03-03 NOTE — Telephone Encounter (Signed)

## 2019-03-04 LAB — NOVEL CORONAVIRUS, NAA: SARS-CoV-2, NAA: NOT DETECTED

## 2019-03-05 ENCOUNTER — Encounter: Payer: Self-pay | Admitting: Cardiovascular Disease

## 2019-03-05 ENCOUNTER — Telehealth: Payer: Self-pay | Admitting: *Deleted

## 2019-03-05 ENCOUNTER — Telehealth (INDEPENDENT_AMBULATORY_CARE_PROVIDER_SITE_OTHER): Payer: PPO | Admitting: Cardiovascular Disease

## 2019-03-05 VITALS — BP 130/80 | HR 64 | Ht 73.0 in | Wt 240.0 lb

## 2019-03-05 DIAGNOSIS — E785 Hyperlipidemia, unspecified: Secondary | ICD-10-CM

## 2019-03-05 DIAGNOSIS — I10 Essential (primary) hypertension: Secondary | ICD-10-CM

## 2019-03-05 DIAGNOSIS — Z955 Presence of coronary angioplasty implant and graft: Secondary | ICD-10-CM

## 2019-03-05 DIAGNOSIS — I25118 Atherosclerotic heart disease of native coronary artery with other forms of angina pectoris: Secondary | ICD-10-CM | POA: Diagnosis not present

## 2019-03-05 NOTE — Progress Notes (Signed)
Virtual Visit via Telephone Note   This visit type was conducted due to national recommendations for restrictions regarding the COVID-19 Pandemic (e.g. social distancing) in an effort to limit this patient's exposure and mitigate transmission in our community.  Due to his co-morbid illnesses, this patient is at least at moderate risk for complications without adequate follow up.  This format is felt to be most appropriate for this patient at this time.  The patient did not have access to video technology/had technical difficulties with video requiring transitioning to audio format only (telephone).  All issues noted in this document were discussed and addressed.  No physical exam could be performed with this format.  Please refer to the patient's chart for his  consent to telehealth for [facility]   Date:  03/05/2019   ID:  Gabriel Schultz, DOB Nov 20, 1950, MRN [id number]  Patient Location: Home Provider Location: Home  PCP:  Cory Munch, PA-C  Cardiologist:  Kate Sable, MD  Electrophysiologist:  None   Evaluation Performed:  Follow-Up Visit  Chief Complaint:  CAD  History of Present Illness:    Gabriel Schultz is a 69 y.o. male with a history of coronary artery disease.  I have not evaluated him since February 2018.  Specifically, he has a history of stenting of the right coronary artery and left circumflex coronary artery on 06/03/2008 and 06/29/2008, respectively.   He sometimes has some lower back and leg fatigue when walking uphill. Sometimes, he has to take a deep breath.  He can walk on level ground indefinitely without chest pain or shortness of breath. He has occasional b/l ankle swelling but it is mild and infrequent. He denies palpitations.  Chest xray on 02/25/19: 1. Mild, stable left basilar linear scarring and/or atelectasis.  He's had issues with "esophageal pain" and abdominal pain.   Past Medical History:  Diagnosis Date  . Coronary artery  disease   . Diabetes mellitus without complication ([place])   . GERD (gastroesophageal reflux disease)   . Gout   . High cholesterol   . Hypertension    Past Surgical History:  Procedure Laterality Date  . COLONOSCOPY N/A 11/04/2018   Procedure: COLONOSCOPY;  Surgeon: Daneil Dolin, MD;  Location: [facility];  Service: Endoscopy;  Laterality: N/A;  1:00  . CORONARY STENT PLACEMENT    . HERNIA REPAIR    . POLYPECTOMY  11/04/2018   Procedure: POLYPECTOMY;  Surgeon: Daneil Dolin, MD;  Location: [facility];  Service: Endoscopy;;     Current Meds  Medication Sig  . allopurinol (ZYLOPRIM) 100 MG tablet Take 100 mg by mouth every other day. In the evening  . ALPRAZolam (XANAX) 0.5 MG tablet Take 0.25-0.5 mg by mouth 3 (three) times daily as needed for anxiety.   [name] amLODipine (NORVASC) 10 MG tablet Take 10 mg by mouth every evening.   [name] aspirin EC 81 MG tablet Take 81 mg by mouth every evening.   . fluticasone (FLONASE) 50 MCG/ACT nasal spray Place 1-2 sprays into both nostrils 2 (two) times daily as needed for allergies or rhinitis.   [name] glipiZIDE (GLUCOTROL XL) 5 MG 24 hr tablet Take 5 mg by mouth every morning.  . metFORMIN (GLUCOPHAGE) 1000 MG tablet Take 1,000 mg by mouth 2 (two) times daily.  . methocarbamol (ROBAXIN) 750 MG tablet Take 750 mg by mouth 2 (two) times daily as needed for muscle spasms (back pain).  . metoprolol succinate (TOPROL-XL) 50 MG 24 hr  tablet Take 50 mg by mouth daily.   [name] MUCINEX MAXIMUM STRENGTH 1200 MG TB12 Take 1,200 mg by mouth 2 (two) times daily.  . naproxen (NAPROSYN) 500 MG tablet Take 500 mg by mouth 2 (two) times daily as needed (back pain/neck stiffness).   [name] omeprazole (PRILOSEC) 20 MG capsule Take 20 mg by mouth every evening.  . pantoprazole (PROTONIX) 40 MG tablet Take 40 mg by mouth daily before breakfast.   . pravastatin (PRAVACHOL) 20 MG tablet Take 20 mg by mouth every evening.   . Sodium Chloride (NASAL MIST) 0.9 % AERS Place 1-2 sprays  into the nose 3 (three) times daily as needed (congestion/stuffy nose.).   [name] SUPREP BOWEL PREP KIT 17.5-3.13-1.6 GM/177ML SOLN Take 354 mLs by mouth once.     Allergies:   Patient has no known allergies.   Social History   Tobacco Use  . Smoking status: Former Smoker    Packs/day: 1.00    Years: 30.00    Pack years: 30.00    Types: Cigarettes    Quit date: 02/20/1988    Years since quitting: 31.0  . Smokeless tobacco: Former Systems developer    Types: Chew    Quit date: 03/18/2016  Substance Use Topics  . Alcohol use: Yes    Comment: occasionally  . Drug use: No     Family Hx: The patient's family history includes Diabetes in his father and mother.  ROS:   Please see the history of present illness.     All other systems reviewed and are negative.   Prior CV studies:   The following studies were reviewed today:   Nuclear stress testing on 04/24/2010 demonstrated normal myocardial perfusion and inferior wall soft tissue attenuation artifact, calculated LVEF 48%.    Echo 04/25/16:  - Left ventricle: The cavity size was normal. Wall thickness was   increased in a pattern of mild LVH. Systolic function was normal.   The estimated ejection fraction was in the range of 60% to 65%.   Wall motion was normal; there were no regional wall motion   abnormalities. Left ventricular diastolic function parameters   were normal. - Aortic valve: Mildly to moderately calcified annulus. Trileaflet;   mildly thickened leaflets. Valve area (VTI): 2.01 cm^2. Valve   area (Vmax): 1.97 cm^2. - Technically adequate study.  Labs/Other Tests and Data Reviewed:    EKG:  No ECG reviewed.  Recent Labs: No results found for requested labs within last 8760 hours.   Recent Lipid Panel No results found for: CHOL, TRIG, HDL, CHOLHDL, LDLCALC, LDLDIRECT  Wt Readings from Last 3 Encounters:  03/05/19 240 lb (108.9 kg)  11/04/18 251 lb (113.9 kg)  12/04/16 240 lb (108.9 kg)     Objective:    Vital  Signs:  BP 130/80   Pulse 64   Ht 6' 1"  (1.854 m)   Wt 240 lb (108.9 kg)   BMI 31.66 kg/m    VITAL SIGNS:  reviewed  ASSESSMENT & PLAN:    1.  Coronary artery disease: Symptomatically stable.  He has a history of stenting of the right coronary artery and left circumflex coronary artery on 06/03/2008 and 06/29/2008, respectively. Continue aspirin, statin, and beta-blocker.  2.  Hypertension: Controlled on present therapy.  No changes.  3.  Hyperlipidemia: Continue statin.   COVID-19 Education: The signs and symptoms of COVID-19 were discussed with the patient and how to seek care for testing (follow up with PCP or arrange E-visit).  The importance  of social distancing was discussed today.  Time:   Today, I have spent 15 minutes with the patient with telehealth technology discussing the above problems.     Medication Adjustments/Labs and Tests Ordered: Current medicines are reviewed at length with the patient today.  Concerns regarding medicines are outlined above.   Tests Ordered: No orders of the defined types were placed in this encounter.   Medication Changes: No orders of the defined types were placed in this encounter.   Follow Up:  Virtual Visit  in 1 year(s)  Signed, Kate Sable, MD  03/05/2019 11:11 AM    [place]

## 2019-03-05 NOTE — Telephone Encounter (Signed)
Patient calling for COVID test result- notified negative

## 2019-03-05 NOTE — Patient Instructions (Signed)

## 2019-03-13 ENCOUNTER — Ambulatory Visit: Payer: PPO | Admitting: Internal Medicine

## 2019-03-13 ENCOUNTER — Encounter: Payer: Self-pay | Admitting: Internal Medicine

## 2019-03-13 ENCOUNTER — Other Ambulatory Visit: Payer: Self-pay

## 2019-03-13 VITALS — BP 150/91 | HR 73 | Temp 97.1°F | Ht 73.0 in | Wt 249.4 lb

## 2019-03-13 DIAGNOSIS — R1310 Dysphagia, unspecified: Secondary | ICD-10-CM

## 2019-03-13 DIAGNOSIS — K219 Gastro-esophageal reflux disease without esophagitis: Secondary | ICD-10-CM

## 2019-03-13 DIAGNOSIS — I25118 Atherosclerotic heart disease of native coronary artery with other forms of angina pectoris: Secondary | ICD-10-CM | POA: Diagnosis not present

## 2019-03-13 DIAGNOSIS — R1319 Other dysphagia: Secondary | ICD-10-CM

## 2019-03-13 NOTE — Patient Instructions (Addendum)
Schedule an EGD with possible esophageal dilation (GERD and Dysphagia) Propofol  GERD information provided  Further recommendations to follow

## 2019-03-13 NOTE — Progress Notes (Signed)
Primary Care Physician:  Ginger Organ Primary Gastroenterologist:  Dr. Gala Romney  Pre-Procedure History & Physical: HPI:  Gabriel Schultz is a 69 y.o. male here in referral from Collene Mares, PA-C for further evaluation of vague esophageal dysphagia symptoms.  Patient gives a decades long history of heartburn reflux symptoms.  Has taken various acid blockers over the years.  More recently he has been maintained on Protonix 40 mg daily.  Over the past few months he noted day lump in his throat when he was swallowing, some difficulty with swallowing.  Some sinus issues as well.  Famotidine 20 mg twice daily was added to his regimen with improvement in the symptoms. Long, long history of smoking and chewing tobacco exposure.  Of note, he woke up 1 morning the first of this year decided he no longer was interested in tobacco products and stopped everything.  He chews gum.  Denies any melena or rectal bleeding.  No early satiety or abdominal pain has not lost any weight.  He is obese.  He is never had an EGD/upper GI tract evaluation.  He does not use alcohol. Colonic adenomas removed from his colon last September; due for surveillance colonoscopy in 5 years  Past Medical History:  Diagnosis Date  . Coronary artery disease   . Diabetes mellitus without complication ([place])   . GERD (gastroesophageal reflux disease)   . Gout   . High cholesterol   . Hypertension     Past Surgical History:  Procedure Laterality Date  . COLONOSCOPY N/A 11/04/2018   Procedure: COLONOSCOPY;  Surgeon: Daneil Dolin, MD;  [facility];  Service: Endoscopy;  Laterality: N/A;  1:00  . CORONARY STENT PLACEMENT    . HERNIA REPAIR    . POLYPECTOMY  11/04/2018   Procedure: POLYPECTOMY;  Surgeon: Daneil Dolin, MD;  [facility];  Service: Endoscopy;;    Prior to Admission medications   Medication Sig Start Date End Date Taking? Authorizing Provider  allopurinol (ZYLOPRIM) 100 MG  tablet Take 100 mg by mouth every other day. In the evening   Yes [provider]  ALPRAZolam (XANAX) 0.5 MG tablet Take 0.25-0.5 mg by mouth 3 (three) times daily as needed for anxiety.  10/28/18  Yes [provider]  amLODipine (NORVASC) 10 MG tablet Take 10 mg by mouth every evening.    Yes [provider]  aspirin EC 81 MG tablet Take 81 mg by mouth every evening.    Yes [provider]  fluticasone (FLONASE) 50 MCG/ACT nasal spray Place 1-2 sprays into both nostrils 2 (two) times daily as needed for allergies or rhinitis.    Yes [provider]  glipiZIDE (GLUCOTROL XL) 5 MG 24 hr tablet Take 5 mg by mouth every morning. 11/05/16  Yes [provider]  meloxicam (MOBIC) 15 MG tablet Take 15 mg by mouth daily. x14 days 03/02/19  Yes [provider]  metFORMIN (GLUCOPHAGE) 1000 MG tablet Take 1,000 mg by mouth 2 (two) times daily. 11/05/16  Yes [provider]  methocarbamol (ROBAXIN) 750 MG tablet Take 750 mg by mouth 2 (two) times daily as needed for muscle spasms (back pain).   Yes [provider]  metoprolol succinate (TOPROL-XL) 50 MG 24 hr tablet Take 50 mg by mouth daily.  11/27/16  Yes [provider]  [place] 1200 MG TB12 Take 1,200 mg by mouth 2 (two) times daily.   Yes [provider]  naproxen (NAPROSYN) 500 MG tablet Take 500 mg by mouth 2 (two) times daily as needed (back pain/neck stiffness).    Yes [provider]  omeprazole (PRILOSEC) 20 MG capsule Take 20 mg by mouth every evening.   Yes [provider]  pantoprazole (PROTONIX) 40 MG tablet Take 40 mg by mouth daily before breakfast.    Yes [provider]  pravastatin (PRAVACHOL) 20 MG tablet Take 20 mg by mouth every evening.    Yes [provider]  Sodium Chloride (NASAL MIST) 0.9 % AERS Place 1-2 sprays into the nose 3 (three) times daily as needed (congestion/stuffy nose.).    Yes  [provider]  SUPREP BOWEL PREP KIT 17.5-3.13-1.6 GM/177ML SOLN Take 354 mLs by mouth once. 10/29/18   [provider]    Allergies as of 03/13/2019  . (No Known Allergies)    Family History  Problem Relation Age of Onset  . Diabetes Mother   . Diabetes Father     Social History   Socioeconomic History  . Marital status: Married    Spouse name: Not on file  . Number of children: Not on file  . Years of education: Not on file  . Highest education level: Not on file  Occupational History  . Not on file  Tobacco Use  . Smoking status: Former Smoker    Packs/day: 1.00    Years: 30.00    Pack years: 30.00    Types: Cigarettes    Quit date: 02/20/1988    Years since quitting: 31.0  . Smokeless tobacco: Former Systems developer    Types: [place] date: 03/18/2016  Substance and Sexual Activity  . Alcohol use: Not Currently    Comment: occasionally  . Drug use: No  . Sexual activity: Not on file  Other Topics Concern  . Not on file  Social History Narrative  . Not on file   Social Determinants of Health   Financial Resource Strain:   . Difficulty of Paying Living Expenses: Not on file  Food Insecurity:   . Worried About Charity fundraiser in the Last Year: Not on file  . Ran Out of Food in the Last Year: Not on file  Transportation Needs:   . Lack of Transportation (Medical): Not on file  . Lack of Transportation (Non-Medical): Not on file  Physical Activity:   . Days of Exercise per Week: Not on file  . Minutes of Exercise per Session: Not on file  Stress:   . Feeling of Stress : Not on file  Social Connections:   . Frequency of Communication with Friends and Family: Not on file  . Frequency of Social Gatherings with Friends and Family: Not on file  . Attends Religious Services: Not on file  . Active Member of Clubs or Organizations: Not on file  . Attends Archivist Meetings: Not on file  . Marital Status: Not on file  Intimate Partner  Violence:   . Fear of Current or Ex-Partner: Not on file  . Emotionally Abused: Not on file  . Physically Abused: Not on file  . Sexually Abused: Not on file    Review of Systems: See HPI, otherwise negative ROS  Physical Exam: BP (!) 150/91   Pulse 73   Temp (!) 97.1 F (36.2 C) (Temporal)   Ht 6' 1"  (1.854 m)   Wt 249 lb 6.4 oz (113.1 kg)   BMI 32.90 kg/m  General:   Alert,  Well-developed, well-nourished, pleasant and cooperative in NAD Neck:  Supple; no masses or thyromegaly. No significant cervical adenopathy. Lungs:  Clear throughout to auscultation.   No wheezes, crackles, or rhonchi. No acute distress. Heart:  Regular rate and rhythm; no murmurs, clicks, rubs,  or gallops. Abdomen: Non-distended, normal bowel sounds.  Soft and nontender without appreciable mass or hepatosplenomegaly.  Pulses:  Normal pulses noted. Extremities:  Without clubbing or edema.  Impression/Plan: 69 year old gentleman with longstanding GERD symptoms.  Vague symptoms of a lump in his throat with some difficulty swallowing over the past couple of months but improved at this time.  Fairly nonspecific.  Although, the observation has been made that with the addition of famotidine symptoms have improved.  Given his long term tobacco exposure and GERD, he certainly needs visualization of his upper GI tract.  He may or may not need an esophageal dilation.  We talked about the multipronged approach in managing  reflux.  He was certainly commended today on tobacco cessation.   Recommendations:  I have offered the patient an EGD with possible esophageal dilation, etc. today per plan.The risks, benefits, limitations, alternatives and imponderables have been reviewed with the patient. Potential for esophageal dilation, biopsy, etc. have also been reviewed.  Questions have been answered. All parties agreeable.  Information on GERD provided  Further recommendations follow once endoscopic evaluation is  taken place.    Notice: This dictation was prepared with Dragon dictation along with smaller phrase technology. Any transcriptional errors that result from this process are unintentional and may not be corrected upon review.

## 2019-05-18 NOTE — Patient Instructions (Signed)
Gabriel Schultz  05/18/2019     @PREFPERIOPPHARMACY @   Your procedure is scheduled on  05/21/2019   Report to [facility] at  [place].  Call this number if you have problems the morning of surgery:  [phone]   Remember:  Follow the diet instructions given to you by Dr Roseanne Kaufman office.                        Take these medicines the morning of surgery with A SIP OF WATER  Xanax(if needed), amlodipine,mobic(if needed), Robaxin(if needed), metoprolol, pantoprazole.    Do not wear jewelry, make-up or nail polish.  Do not wear lotions, powders, or perfumes. Please wear deodorant and brush your teeth.  Do not shave 48 hours prior to surgery.  Men may shave face and neck.  Do not bring valuables to the hospital.  [facility] is not responsible for any belongings or valuables.  Contacts, dentures or bridgework may not be worn into surgery.  Leave your suitcase in the car.  After surgery it may be brought to your room.  For patients admitted to the hospital, discharge time will be determined by your treatment team.  Patients discharged the day of surgery will not be allowed to drive home.   Name and phone number of your driver:   family Special instructions:  DO NOT smoke the morning of your procedure.  Please read over the following fact sheets that you were given. Anesthesia Post-op Instructions and Care and Recovery After Surgery       Upper Endoscopy, Adult, Care After This sheet gives you information about how to care for yourself after your procedure. Your health care provider may also give you more specific instructions. If you have problems or questions, contact your health care provider. What can I expect after the procedure? After the procedure, it is common to have:  A sore throat.  Mild stomach pain or discomfort.  Bloating.  Nausea. Follow these instructions at home:   Follow instructions from your health care provider about what to eat or  drink after your procedure.  Return to your normal activities as told by your health care provider. Ask your health care provider what activities are safe for you.  Take over-the-counter and prescription medicines only as told by your health care provider.  Do not drive for 24 hours if you were given a sedative during your procedure.  Keep all follow-up visits as told by your health care provider. This is important. Contact a health care provider if you have:  A sore throat that lasts longer than one day.  Trouble swallowing. Get help right away if:  You vomit blood or your vomit looks like coffee grounds.  You have: ? A fever. ? Bloody, black, or tarry stools. ? A severe sore throat or you cannot swallow. ? Difficulty breathing. ? Severe pain in your chest or abdomen. Summary  After the procedure, it is common to have a sore throat, mild stomach discomfort, bloating, and nausea.  Do not drive for 24 hours if you were given a sedative during the procedure.  Follow instructions from your health care provider about what to eat or drink after your procedure.  Return to your normal activities as told by your health care provider. This information is not intended to replace advice given to you by your health care provider. Make sure you discuss any questions you  have with your health care provider. Document Revised: 07/30/2017 Document Reviewed: 07/08/2017 Elsevier Patient Education  [place].  Esophageal Dilatation Esophageal dilatation, also called esophageal dilation, is a procedure to widen or open (dilate) a blocked or narrowed part of the esophagus. The esophagus is the part of the body that moves food and liquid from the mouth to the stomach. You may need this procedure if:  You have a buildup of scar tissue in your esophagus that makes it difficult, painful, or impossible to swallow. This can be caused by gastroesophageal reflux disease (GERD).  You have cancer  of the esophagus.  There is a problem with how food moves through your esophagus. In some cases, you may need this procedure repeated at a later time to dilate the esophagus gradually. Tell a health care provider about:  Any allergies you have.  All medicines you are taking, including vitamins, herbs, eye drops, creams, and over-the-counter medicines.  Any problems you or family members have had with anesthetic medicines.  Any blood disorders you have.  Any surgeries you have had.  Any medical conditions you have.  Any antibiotic medicines you are required to take before dental procedures.  Whether you are pregnant or may be pregnant. What are the risks? Generally, this is a safe procedure. However, problems may occur, including:  Bleeding due to a tear in the lining of the esophagus.  A hole (perforation) in the esophagus. What happens before the procedure?  Follow instructions from your health care provider about eating or drinking restrictions.  Ask your health care provider about changing or stopping your regular medicines. This is especially important if you are taking diabetes medicines or blood thinners.  Plan to have someone take you home from the hospital or clinic.  Plan to have a responsible adult care for you for at least 24 hours after you leave the hospital or clinic. This is important. What happens during the procedure?  You may be given a medicine to help you relax (sedative).  A numbing medicine may be sprayed into the back of your throat, or you may gargle the medicine.  Your health care provider may perform the dilatation using various surgical instruments, such as: ? Simple dilators. This instrument is carefully placed in the esophagus to stretch it. ? Guided wire bougies. This involves using an endoscope to insert a wire into the esophagus. A dilator is passed over this wire to enlarge the esophagus. Then the wire is removed. ? Balloon dilators. An  endoscope with a small balloon at the end is inserted into the esophagus. The balloon is inflated to stretch the esophagus and open it up. The procedure may vary among health care providers and hospitals. What happens after the procedure?  Your blood pressure, heart rate, breathing rate, and blood oxygen level will be monitored until the medicines you were given have worn off.  Your throat may feel slightly sore and numb. This will improve slowly over time.  You will not be allowed to eat or drink until your throat is no longer numb.  When you are able to drink, urinate, and sit on the edge of the bed without nausea or dizziness, you may be able to return home. Follow these instructions at home:  Take over-the-counter and prescription medicines only as told by your health care provider.  Do not drive for 24 hours if you were given a sedative during your procedure.  You should have a responsible adult with you for 24  hours after the procedure.  Follow instructions from your health care provider about any eating or drinking restrictions.  Do not use any products that contain nicotine or tobacco, such as cigarettes and e-cigarettes. If you need help quitting, ask your health care provider.  Keep all follow-up visits as told by your health care provider. This is important. Get help right away if you:  Have a fever.  Have chest pain.  Have pain that is not relieved by medication.  Have trouble breathing.  Have trouble swallowing.  Vomit blood. Summary  Esophageal dilatation, also called esophageal dilation, is a procedure to widen or open (dilate) a blocked or narrowed part of the esophagus.  Plan to have someone take you home from the hospital or clinic.  For this procedure, a numbing medicine may be sprayed into the back of your throat, or you may gargle the medicine.  Do not drive for 24 hours if you were given a sedative during your procedure. This information is not  intended to replace advice given to you by your health care provider. Make sure you discuss any questions you have with your health care provider. Document Revised: 12/03/2018 Document Reviewed: 12/11/2016 Elsevier Patient Education  2020 [place] After These instructions provide you with information about caring for yourself after your procedure. Your health care provider may also give you more specific instructions. Your treatment has been planned according to current medical practices, but problems sometimes occur. Call your health care provider if you have any problems or questions after your procedure. What can I expect after the procedure? After your procedure, you may:  Feel sleepy for several hours.  Feel clumsy and have poor balance for several hours.  Feel forgetful about what happened after the procedure.  Have poor judgment for several hours.  Feel nauseous or vomit.  Have a sore throat if you had a breathing tube during the procedure. Follow these instructions at home: For at least 24 hours after the procedure:      Have a responsible adult stay with you. It is important to have someone help care for you until you are awake and alert.  Rest as needed.  Do not: ? Participate in activities in which you could fall or become injured. ? Drive. ? Use heavy machinery. ? Drink alcohol. ? Take sleeping pills or medicines that cause drowsiness. ? Make important decisions or sign legal documents. ? Take care of children on your own. Eating and drinking  Follow the diet that is recommended by your health care provider.  If you vomit, drink water, juice, or soup when you can drink without vomiting.  Make sure you have little or no nausea before eating solid foods. General instructions  Take over-the-counter and prescription medicines only as told by your health care provider.  If you have sleep apnea, surgery and certain medicines  can increase your risk for breathing problems. Follow instructions from your health care provider about wearing your sleep device: ? Anytime you are sleeping, including during daytime naps. ? While taking prescription pain medicines, sleeping medicines, or medicines that make you drowsy.  If you smoke, do not smoke without supervision.  Keep all follow-up visits as told by your health care provider. This is important. Contact a health care provider if:  You keep feeling nauseous or you keep vomiting.  You feel light-headed.  You develop a rash.  You have a fever. Get help right away if:  You have trouble  breathing. Summary  For several hours after your procedure, you may feel sleepy and have poor judgment.  Have a responsible adult stay with you for at least 24 hours or until you are awake and alert. This information is not intended to replace advice given to you by your health care provider. Make sure you discuss any questions you have with your health care provider. Document Revised: 05/06/2017 Document Reviewed: 05/29/2015 Elsevier Patient Education  [place].

## 2019-05-19 ENCOUNTER — Other Ambulatory Visit (HOSPITAL_COMMUNITY)
Admission: RE | Admit: 2019-05-19 | Discharge: 2019-05-19 | Disposition: A | Discharge status: Home / Self Care | Payer: PPO | Source: Ambulatory Visit | Attending: Internal Medicine | Admitting: Internal Medicine

## 2019-05-19 ENCOUNTER — Other Ambulatory Visit: Payer: Self-pay

## 2019-05-19 ENCOUNTER — Encounter (HOSPITAL_COMMUNITY): Payer: Self-pay

## 2019-05-19 ENCOUNTER — Encounter (HOSPITAL_COMMUNITY)
Admission: RE | Admit: 2019-05-19 | Discharge: 2019-05-19 | Disposition: A | Discharge status: Home / Self Care | Payer: PPO | Source: Ambulatory Visit | Attending: Internal Medicine | Admitting: Internal Medicine

## 2019-05-19 DIAGNOSIS — Z01812 Encounter for preprocedural laboratory examination: Secondary | ICD-10-CM | POA: Insufficient documentation

## 2019-05-19 DIAGNOSIS — Z20822 Contact with and (suspected) exposure to covid-19: Secondary | ICD-10-CM | POA: Insufficient documentation

## 2019-05-19 HISTORY — DX: Unspecified osteoarthritis, unspecified site: M19.90

## 2019-05-19 HISTORY — DX: Acute myocardial infarction, unspecified: I21.9

## 2019-05-19 LAB — BASIC METABOLIC PANEL
Anion gap: 11 (ref 5–15)
BUN: 24 mg/dL — ABNORMAL HIGH (ref 8–23)
CO2: 21 mmol/L — ABNORMAL LOW (ref 22–32)
Calcium: 9 mg/dL (ref 8.9–10.3)
Chloride: 107 mmol/L (ref 98–111)
Creatinine, Ser: 1.76 mg/dL — ABNORMAL HIGH (ref 0.61–1.24)
GFR calc Af Amer: 45 mL/min — ABNORMAL LOW (ref >60)
GFR calc non Af Amer: 39 mL/min — ABNORMAL LOW (ref >60)
Glucose, Bld: 138 mg/dL — ABNORMAL HIGH (ref 70–99)
Potassium: 4.4 mmol/L (ref 3.5–5.1)
Sodium: 139 mmol/L (ref 135–145)

## 2019-05-19 LAB — SARS CORONAVIRUS 2 (TAT 6-24 HRS): SARS Coronavirus 2: NEGATIVE

## 2019-05-21 ENCOUNTER — Ambulatory Visit (HOSPITAL_COMMUNITY): Payer: PPO | Admitting: Anesthesiology

## 2019-05-21 ENCOUNTER — Ambulatory Visit (HOSPITAL_COMMUNITY)
Admission: RE | Admit: 2019-05-21 | Discharge: 2019-05-21 | Disposition: A | Discharge status: Home / Self Care | Payer: PPO | Attending: Internal Medicine | Admitting: Internal Medicine

## 2019-05-21 ENCOUNTER — Encounter (HOSPITAL_COMMUNITY)
Admission: RE | Disposition: A | Discharge status: Home / Self Care | Payer: Self-pay | Source: Home / Self Care | Attending: Internal Medicine

## 2019-05-21 ENCOUNTER — Encounter (HOSPITAL_COMMUNITY): Payer: Self-pay | Admitting: Internal Medicine

## 2019-05-21 DIAGNOSIS — Z791 Long term (current) use of non-steroidal anti-inflammatories (NSAID): Secondary | ICD-10-CM | POA: Insufficient documentation

## 2019-05-21 DIAGNOSIS — I10 Essential (primary) hypertension: Secondary | ICD-10-CM | POA: Diagnosis not present

## 2019-05-21 DIAGNOSIS — I2510 Atherosclerotic heart disease of native coronary artery without angina pectoris: Secondary | ICD-10-CM | POA: Diagnosis not present

## 2019-05-21 DIAGNOSIS — E7800 Pure hypercholesterolemia, unspecified: Secondary | ICD-10-CM | POA: Insufficient documentation

## 2019-05-21 DIAGNOSIS — Z7982 Long term (current) use of aspirin: Secondary | ICD-10-CM | POA: Diagnosis not present

## 2019-05-21 DIAGNOSIS — K222 Esophageal obstruction: Secondary | ICD-10-CM | POA: Insufficient documentation

## 2019-05-21 DIAGNOSIS — K2270 Barrett's esophagus without dysplasia: Secondary | ICD-10-CM | POA: Diagnosis not present

## 2019-05-21 DIAGNOSIS — I252 Old myocardial infarction: Secondary | ICD-10-CM | POA: Insufficient documentation

## 2019-05-21 DIAGNOSIS — E119 Type 2 diabetes mellitus without complications: Secondary | ICD-10-CM | POA: Diagnosis not present

## 2019-05-21 DIAGNOSIS — Z955 Presence of coronary angioplasty implant and graft: Secondary | ICD-10-CM | POA: Diagnosis not present

## 2019-05-21 DIAGNOSIS — K219 Gastro-esophageal reflux disease without esophagitis: Secondary | ICD-10-CM | POA: Insufficient documentation

## 2019-05-21 DIAGNOSIS — Z7984 Long term (current) use of oral hypoglycemic drugs: Secondary | ICD-10-CM | POA: Insufficient documentation

## 2019-05-21 DIAGNOSIS — R1310 Dysphagia, unspecified: Secondary | ICD-10-CM | POA: Diagnosis not present

## 2019-05-21 DIAGNOSIS — M1990 Unspecified osteoarthritis, unspecified site: Secondary | ICD-10-CM | POA: Insufficient documentation

## 2019-05-21 DIAGNOSIS — Z87891 Personal history of nicotine dependence: Secondary | ICD-10-CM | POA: Insufficient documentation

## 2019-05-21 DIAGNOSIS — Z79899 Other long term (current) drug therapy: Secondary | ICD-10-CM | POA: Diagnosis not present

## 2019-05-21 DIAGNOSIS — K317 Polyp of stomach and duodenum: Secondary | ICD-10-CM | POA: Insufficient documentation

## 2019-05-21 DIAGNOSIS — M109 Gout, unspecified: Secondary | ICD-10-CM | POA: Diagnosis not present

## 2019-05-21 HISTORY — PX: MALONEY DILATION: SHX5535

## 2019-05-21 HISTORY — PX: BIOPSY: SHX5522

## 2019-05-21 HISTORY — PX: ESOPHAGOGASTRODUODENOSCOPY (EGD) WITH PROPOFOL: SHX5813

## 2019-05-21 LAB — GLUCOSE, CAPILLARY: Glucose-Capillary: 165 mg/dL — ABNORMAL HIGH (ref 70–99)

## 2019-05-21 SURGERY — ESOPHAGOGASTRODUODENOSCOPY (EGD) WITH PROPOFOL
Anesthesia: General

## 2019-05-21 MED ORDER — LIDOCAINE VISCOUS HCL 2 % MT SOLN
OROMUCOSAL | Status: AC
  Filled 2019-05-21: qty 15

## 2019-05-21 MED ORDER — PROPOFOL 10 MG/ML IV BOLUS
INTRAVENOUS | Status: DC | PRN
  Administered 2019-05-21: 50 mg via INTRAVENOUS

## 2019-05-21 MED ORDER — PROPOFOL 10 MG/ML IV BOLUS
INTRAVENOUS | Status: AC
  Filled 2019-05-21: qty 40

## 2019-05-21 MED ORDER — LIDOCAINE VISCOUS HCL 2 % MT SOLN: 15.0000 mL | Freq: Once | OROMUCOSAL | Status: DC

## 2019-05-21 MED ORDER — LACTATED RINGERS IV SOLN
Freq: Once | INTRAVENOUS | Status: AC
  Administered 2019-05-21: 08:00:00 1000 mL via INTRAVENOUS

## 2019-05-21 MED ORDER — KETAMINE HCL 50 MG/5ML IJ SOSY
PREFILLED_SYRINGE | INTRAMUSCULAR | Status: AC
  Filled 2019-05-21: qty 5

## 2019-05-21 MED ORDER — PROPOFOL 500 MG/50ML IV EMUL
INTRAVENOUS | Status: DC | PRN
  Administered 2019-05-21: 150 ug/kg/min via INTRAVENOUS

## 2019-05-21 MED ORDER — LIDOCAINE VISCOUS HCL 2 % MT SOLN
5.0000 mL | Freq: Once | OROMUCOSAL | Status: AC
  Administered 2019-05-21: 09:00:00 5 mL via OROMUCOSAL

## 2019-05-21 MED ORDER — KETAMINE HCL 10 MG/ML IJ SOLN
INTRAMUSCULAR | Status: DC | PRN
  Administered 2019-05-21: 20 mg via INTRAVENOUS
  Administered 2019-05-21: 30 mg via INTRAVENOUS

## 2019-05-21 MED ORDER — LACTATED RINGERS IV SOLN: INTRAVENOUS | Status: DC | PRN

## 2019-05-21 NOTE — Op Note (Signed)
[facility] Patient Name: Gabriel Schultz Procedure Date: 05/21/2019 8:39 AM MRN: [id number] Date of Birth: April 08, 1950 Attending MD: Norvel Richards , MD CSN: [id number] Age: 69 Admit Type: Outpatient Procedure:                Upper GI endoscopy Indications:              Dysphagia Providers:                Norvel Richards, MD, Janeece Riggers, RN, Randa Spike, Technician Referring MD:              Medicines:                Propofol per Anesthesia Complications:            No immediate complications. Estimated Blood Loss:     Estimated blood loss was minimal. Procedure:                Pre-Anesthesia Assessment:                           - Prior to the procedure, a History and Physical                            was performed, and patient medications and                            allergies were reviewed. The patient's tolerance of                            previous anesthesia was also reviewed. The risks                            and benefits of the procedure and the sedation                            options and risks were discussed with the patient.                            All questions were answered, and informed consent                            was obtained. Prior Anticoagulants: The patient has                            taken no previous anticoagulant or antiplatelet                            agents. ASA Grade Assessment: II - A patient with                            mild systemic disease. After reviewing the risks  and benefits, the patient was deemed in                            satisfactory condition to undergo the procedure.                           After obtaining informed consent, the endoscope was                            passed under direct vision. Throughout the                            procedure, the patient's blood pressure, pulse, and                            oxygen saturations were  monitored continuously. The                            GIF-H190 ([id number]) scope was introduced through the                            mouth, and advanced to the second part of duodenum.                            The upper GI endoscopy was accomplished without                            difficulty. The patient tolerated the procedure                            well. Scope In: 8:56:00 AM Scope Out: 9:07:13 AM Total Procedure Duration: 0 hours 11 minutes 13 seconds  Findings:      A mild complaint incomplete Schatzki ring was found at the       gastroesophageal junction. 1 cm "island" of salmon-colored epithelium       distal esophagus. No nodularity. No esophagitis.      Multiple fundal gland appearing polyps in the body varying in size from       55mm to 9 to 14mm. Gastric mucosa diffusely erythematous with erosions in       the antrum. Patent pylorus examination of the bulb revealed no       abnormalities with examination of the D2 revealed a fairly tight       duodenal web which initially would not admit the scope with pressure       applied it was dilated the scope easily passed through. This appeared to       be a totally benign lesion.      Scope was withdrawn and a 56 [place] Maloney dilator was passed to full       insertion with mild resistance. Look back revealed no complication       related this maneuver. No bleeding. Subsequently biopsies of the       abnormal distal esophagus, gastric polyp Impression:               - Mild incomplete Schatzki ring. Status post  dilation. Status post esophageal biopsy; status                            post gastric biopsy. Duodenal web - status post                            dilation as described above Moderate Sedation:      Moderate (conscious) sedation was personally administered by an       anesthesia professional. The following parameters were monitored: oxygen       saturation, heart rate, blood pressure,  respiratory rate, EKG, adequacy       of pulmonary ventilation, and response to care. Recommendation:           - Patient has a contact number available for                            emergencies. The signs and symptoms of potential                            delayed complications were discussed with the                            patient. Return to normal activities tomorrow.                            Written discharge instructions were provided to the                            patient.                           - Advance diet as tolerated. Increase Protonix to                            40 mg twice daily. Follow-up on pathology. Office                            visit with [place] in 3 months Procedure Code(s):        --- Professional ---                           [phone], Esophagogastroduodenoscopy, flexible,                            transoral; diagnostic, including collection of                            specimen(s) by brushing or washing, when performed                            (separate procedure) Diagnosis Code(s):        --- Professional ---                           K22.2, Esophageal obstruction  R13.10, Dysphagia, unspecified CPT copyright 2019 American Medical Association. All rights reserved. The codes documented in this report are preliminary and upon coder review may  be revised to meet current compliance requirements. Cristopher Estimable. Pavan Bring, MD Norvel Richards, MD 05/21/2019 9:20:19 AM This report has been signed electronically. Number of Addenda: 0

## 2019-05-21 NOTE — Anesthesia Preprocedure Evaluation (Addendum)
Anesthesia Evaluation  Patient identified by MRN, date of birth, ID band Patient awake    Reviewed: Allergy & Precautions, NPO status , Patient's Chart, lab work & pertinent test results, reviewed documented beta blocker date and time   History of Anesthesia Complications Negative for: history of anesthetic complications  Airway Mallampati: II  TM Distance: >3 FB Neck ROM: Full    Dental  (+) Edentulous Upper, Edentulous Lower   Pulmonary former smoker,    Pulmonary exam normal breath sounds clear to auscultation       Cardiovascular Exercise Tolerance: Good METS: 3 - Mets hypertension, Pt. on medications and Pt. on home beta blockers + angina with exertion + CAD, + Past MI and + Cardiac Stents  Normal cardiovascular exam+ dysrhythmias (Occasional PVCs )  Rhythm:Regular Rate:Normal  Occasional PVCs    Neuro/Psych negative psych ROS   GI/Hepatic Neg liver ROS, GERD  Medicated and Poorly Controlled,  Endo/Other  diabetes, Well Controlled, Type 2, Oral Hypoglycemic Agents  Renal/GU      Musculoskeletal  (+) Arthritis ,   Abdominal   Peds  Hematology   Anesthesia Other Findings Left ventricle: The cavity size was normal. Wall thickness was  increased in a pattern of mild LVH. Systolic function was normal.  The estimated ejection fraction was in the range of 60% to 65%.  Wall motion was normal; there were no regional wall motion  abnormalities. Left ventricular diastolic function parameters  were normal.  - Aortic valve: Mildly to moderately calcified annulus. Trileaflet;  mildly thickened leaflets. Valve area (VTI): 2.01 cm^2. Valve  area (Vmax): 1.97 cm^2.   Reproductive/Obstetrics                           Anesthesia Physical Anesthesia Plan  ASA: III  Anesthesia Plan: General   Post-op Pain Management:    Induction: Intravenous  PONV Risk Score and Plan: 2 and TIVA  and Treatment may vary due to age or medical condition  Airway Management Planned: Nasal Cannula and Natural Airway  Additional Equipment:   Intra-op Plan:   Post-operative Plan:   Informed Consent: I have reviewed the patients History and Physical, chart, labs and discussed the procedure including the risks, benefits and alternatives for the proposed anesthesia with the patient or authorized representative who has indicated his/her understanding and acceptance.     Dental advisory given  Plan Discussed with: CRNA and Surgeon  Anesthesia Plan Comments:        Anesthesia Quick Evaluation

## 2019-05-21 NOTE — H&P (Signed)
done

## 2019-05-21 NOTE — Discharge Instructions (Signed)
Upper Endoscopy, Adult, Care After This sheet gives you information about how to care for yourself after your procedure. Your health care provider may also give you more specific instructions. If you have problems or questions, contact your health care provider. What can I expect after the procedure? After the procedure, it is common to have:  A sore throat.  Mild stomach pain or discomfort.  Bloating.  Nausea. Follow these instructions at home:   Follow instructions from your health care provider about what to eat or drink after your procedure.  Return to your normal activities as told by your health care provider. Ask your health care provider what activities are safe for you.  Take over-the-counter and prescription medicines only as told by your health care provider.  Do not drive for 24 hours if you were given a sedative during your procedure.  Keep all follow-up visits as told by your health care provider. This is important. Contact a health care provider if you have:  A sore throat that lasts longer than one day.  Trouble swallowing. Get help right away if:  You vomit blood or your vomit looks like coffee grounds.  You have: ? A fever. ? Bloody, black, or tarry stools. ? A severe sore throat or you cannot swallow. ? Difficulty breathing. ? Severe pain in your chest or abdomen. Summary  After the procedure, it is common to have a sore throat, mild stomach discomfort, bloating, and nausea.  Do not drive for 24 hours if you were given a sedative during the procedure.  Follow instructions from your health care provider about what to eat or drink after your procedure.  Return to your normal activities as told by your health care provider. This information is not intended to replace advice given to you by your health care provider. Make sure you discuss any questions you have with your health care provider. Document Revised: 07/30/2017 Document Reviewed:  07/08/2017 Elsevier Patient Education  [place].  Stop omeprazole and Protonix once daily.  Increase Protonix to 40 mg twice daily  Further recommendations to follow pending review of pathology report  Office visit with [place] in 3 months  At patient request, I spoke to [place] at [phone] and reviewed results/recommendations.

## 2019-05-21 NOTE — Anesthesia Postprocedure Evaluation (Signed)
Anesthesia Post Note  Patient: Gabriel Schultz  Procedure(s) Performed: ESOPHAGOGASTRODUODENOSCOPY (EGD) WITH PROPOFOL (N/A ) MALONEY DILATION (N/A ) BIOPSY  Patient location during evaluation: PACU Anesthesia Type: General Level of consciousness: awake, oriented, awake and alert and patient cooperative Pain management: pain level controlled Vital Signs Assessment: post-procedure vital signs reviewed and stable Respiratory status: spontaneous breathing, respiratory function stable and nonlabored ventilation Cardiovascular status: stable Postop Assessment: no apparent nausea or vomiting Anesthetic complications: no     Last Vitals:  Vitals:   05/21/19 0749  BP: (!) 146/94  Resp: 18  Temp: 36.7 C  SpO2: 98%    Last Pain:  Vitals:   05/21/19 0855  TempSrc:   PainSc: 0-No pain                 Doninique Lwin

## 2019-05-21 NOTE — H&P (Signed)
@LOGO @   Primary Care Physician:  Ginger Organ Primary Gastroenterologist:  Dr. Gala Romney  Pre-Procedure History & Physical: HPI:  Gabriel Schultz is a 69 y.o. male here for here for further evaluation of vague esophageal dysphagia via EGD.  Currently takes Protonix 40 mg once daily with some breakthrough symptoms.  Famotidine as needed.  Past Medical History:  Diagnosis Date  . Arthritis   . Coronary artery disease   . Diabetes mellitus without complication ([place])   . GERD (gastroesophageal reflux disease)   . Gout   . High cholesterol   . Hypertension   . Myocardial infarction [facility])    2003    Past Surgical History:  Procedure Laterality Date  . COLONOSCOPY N/A 11/04/2018   Procedure: COLONOSCOPY;  Surgeon: Daneil Dolin, MD;  [facility];  Service: Endoscopy;  Laterality: N/A;  1:00  . CORONARY STENT PLACEMENT    . HERNIA REPAIR Left    inguinal  . POLYPECTOMY  11/04/2018   Procedure: POLYPECTOMY;  Surgeon: Daneil Dolin, MD;  [facility];  Service: Endoscopy;;    Prior to Admission medications   Medication Sig Start Date End Date Taking? Authorizing Provider  allopurinol (ZYLOPRIM) 100 MG tablet Take 100 mg by mouth every other day. In the evening   Yes [provider]  ALPRAZolam (XANAX) 0.5 MG tablet Take 0.25-0.5 mg by mouth 3 (three) times daily as needed for anxiety.  10/28/18  Yes [provider]  amLODipine (NORVASC) 10 MG tablet Take 10 mg by mouth every evening.    Yes [provider]  aspirin EC 81 MG tablet Take 81 mg by mouth every evening.    Yes [provider]  fluticasone (FLONASE) 50 MCG/ACT nasal spray Place 1-2 sprays into both nostrils 2 (two) times daily as needed for allergies or rhinitis.    Yes [provider]  glipiZIDE (GLUCOTROL XL) 5 MG 24 hr tablet Take 5 mg by mouth every morning. 11/05/16  Yes [provider]  metFORMIN (GLUCOPHAGE) 1000 MG tablet Take 1,000 mg  by mouth 2 (two) times daily. 11/05/16  Yes [provider]  methocarbamol (ROBAXIN) 750 MG tablet Take 750 mg by mouth 2 (two) times daily as needed for muscle spasms (back pain).   Yes [provider]  metoprolol succinate (TOPROL-XL) 50 MG 24 hr tablet Take 50 mg by mouth daily.  11/27/16  Yes [provider]  [place] 1200 MG TB12 Take 1,200 mg by mouth daily.    Yes [provider]  naproxen (NAPROSYN) 500 MG tablet Take 500 mg by mouth 2 (two) times daily as needed (back pain/neck stiffness).    Yes [provider]  omeprazole (PRILOSEC) 20 MG capsule Take 20 mg by mouth 2 (two) times daily before a meal.    Yes [provider]  pantoprazole (PROTONIX) 40 MG tablet Take 40 mg by mouth daily before breakfast.    Yes [provider]  pravastatin (PRAVACHOL) 20 MG tablet Take 20 mg by mouth every evening.    Yes [provider]  meloxicam (MOBIC) 15 MG tablet Take 15 mg by mouth daily. x14 days 03/02/19   [provider]    Allergies as of 03/13/2019  . (No Known Allergies)    Family History  Problem Relation Age of Onset  . Diabetes Mother   . Diabetes Father     Social History   Socioeconomic History  . Marital status:  Married    Spouse name: Not on file  . Number of children: Not on file  . Years of education: Not on file  . Highest education level: Not on file  Occupational History  . Not on file  Tobacco Use  . Smoking status: Former Smoker    Packs/day: 1.00    Years: 30.00    Pack years: 30.00    Types: Cigarettes    Quit date: 02/20/1988    Years since quitting: 31.2  . Smokeless tobacco: Former Systems developer    Types: [place] date: 03/18/2016  Substance and Sexual Activity  . Alcohol use: Not Currently    Comment: occasionally  . Drug use: No  . Sexual activity: Yes  Other Topics Concern  . Not on file  Social History Narrative  . Not on file   Social Determinants of  Health   Financial Resource Strain:   . Difficulty of Paying Living Expenses:   Food Insecurity:   . Worried About Charity fundraiser in the Last Year:   . Arboriculturist in the Last Year:   Transportation Needs:   . Film/video editor (Medical):   [name] Lack of Transportation (Non-Medical):   Physical Activity:   . Days of Exercise per Week:   . Minutes of Exercise per Session:   Stress:   . Feeling of Stress :   Social Connections:   . Frequency of Communication with Friends and Family:   . Frequency of Social Gatherings with Friends and Family:   . Attends Religious Services:   . Active Member of Clubs or Organizations:   . Attends Archivist Meetings:   [name] Marital Status:   Intimate Partner Violence:   . Fear of Current or Ex-Partner:   . Emotionally Abused:   [name] Physically Abused:   . Sexually Abused:     Review of Systems: See HPI, otherwise negative ROS  Physical Exam: BP (!) 146/94   Temp 98 F (36.7 C) (Oral)   Resp 18   SpO2 98%  General:   Alert,  Well-developed, well-nourished, pleasant and cooperative in NAD Neck:  Supple; no masses or thyromegaly. No significant cervical adenopathy. Lungs:  Clear throughout to auscultation.   No wheezes, crackles, or rhonchi. No acute distress. Heart:  Regular rate and rhythm; no murmurs, clicks, rubs,  or gallops. Abdomen: Non-distended, normal bowel sounds.  Soft and nontender without appreciable mass or hepatosplenomegaly.  Pulses:  Normal pulses noted. Extremities:  Without clubbing or edema.  Impression/Plan: 69 year old gentleman here for EGD for further evaluation of vague esophageal dysphagia and suboptimally controlled GERD.  I have offered the patient an EGD with esophageal dilation as feasible/appropriate per plan.  The risks, benefits, limitations, alternatives and imponderables have been reviewed with the patient. Potential for esophageal dilation, biopsy, etc. have also been reviewed.  Questions  have been answered. All parties agreeable.     Notice: This dictation was prepared with Dragon dictation along with smaller phrase technology. Any transcriptional errors that result from this process are unintentional and may not be corrected upon review.

## 2019-05-21 NOTE — Transfer of Care (Signed)
Immediate Anesthesia Transfer of Care Note  Patient: Gabriel Schultz  Procedure(s) Performed: ESOPHAGOGASTRODUODENOSCOPY (EGD) WITH PROPOFOL (N/A ) MALONEY DILATION (N/A ) BIOPSY  Patient Location: PACU  Anesthesia Type:General  Level of Consciousness: awake, alert , oriented and patient cooperative  Airway & Oxygen Therapy: Patient Spontanous Breathing  Post-op Assessment: Report given to RN, Post -op Vital signs reviewed and stable and Patient moving all extremities X 4  Post vital signs: Reviewed and stable  Last Vitals:  Vitals Value Taken Time  BP    Temp    Pulse    Resp    SpO2      Last Pain:  Vitals:   05/21/19 0855  TempSrc:   PainSc: 0-No pain      Patients Stated Pain Goal: 7 (67/01/41 0301)  Complications: No apparent anesthesia complications

## 2019-05-22 ENCOUNTER — Other Ambulatory Visit: Payer: Self-pay

## 2019-05-22 ENCOUNTER — Encounter: Payer: Self-pay | Admitting: Internal Medicine

## 2019-05-22 LAB — SURGICAL PATHOLOGY

## 2019-05-25 ENCOUNTER — Telehealth: Payer: Self-pay

## 2019-05-25 NOTE — Telephone Encounter (Signed)
Per RMR- Send letter to patient.  Send copy of letter with path to referring provider and PCP.  Ov in 10months.   Send info on Barretts esophagus

## 2019-05-25 NOTE — Telephone Encounter (Signed)
OV made °

## 2019-05-25 NOTE — Telephone Encounter (Signed)
Noted. Letter mailed with Barrett's Esophagus info.

## 2019-07-29 DIAGNOSIS — R1011 Right upper quadrant pain: Secondary | ICD-10-CM | POA: Diagnosis not present

## 2019-07-29 DIAGNOSIS — Z6834 Body mass index (BMI) 34.0-34.9, adult: Secondary | ICD-10-CM | POA: Diagnosis not present

## 2019-07-29 DIAGNOSIS — E6609 Other obesity due to excess calories: Secondary | ICD-10-CM | POA: Diagnosis not present

## 2019-07-30 ENCOUNTER — Emergency Department (HOSPITAL_COMMUNITY): Payer: PPO

## 2019-07-30 ENCOUNTER — Encounter (HOSPITAL_COMMUNITY): Payer: Self-pay | Admitting: Emergency Medicine

## 2019-07-30 ENCOUNTER — Emergency Department (HOSPITAL_COMMUNITY)
Admission: EM | Admit: 2019-07-30 | Discharge: 2019-07-30 | Disposition: A | Discharge status: Home / Self Care | Payer: PPO | Source: Home / Self Care | Attending: Emergency Medicine | Admitting: Emergency Medicine

## 2019-07-30 ENCOUNTER — Other Ambulatory Visit: Payer: Self-pay

## 2019-07-30 DIAGNOSIS — Z7984 Long term (current) use of oral hypoglycemic drugs: Secondary | ICD-10-CM | POA: Diagnosis not present

## 2019-07-30 DIAGNOSIS — K82A1 Gangrene of gallbladder in cholecystitis: Secondary | ICD-10-CM | POA: Diagnosis present

## 2019-07-30 DIAGNOSIS — K7460 Unspecified cirrhosis of liver: Secondary | ICD-10-CM | POA: Diagnosis not present

## 2019-07-30 DIAGNOSIS — E7800 Pure hypercholesterolemia, unspecified: Secondary | ICD-10-CM | POA: Diagnosis present

## 2019-07-30 DIAGNOSIS — Z79899 Other long term (current) drug therapy: Secondary | ICD-10-CM | POA: Diagnosis not present

## 2019-07-30 DIAGNOSIS — M1A9XX0 Chronic gout, unspecified, without tophus (tophi): Secondary | ICD-10-CM | POA: Diagnosis present

## 2019-07-30 DIAGNOSIS — E1129 Type 2 diabetes mellitus with other diabetic kidney complication: Secondary | ICD-10-CM | POA: Diagnosis not present

## 2019-07-30 DIAGNOSIS — Z7982 Long term (current) use of aspirin: Secondary | ICD-10-CM | POA: Diagnosis not present

## 2019-07-30 DIAGNOSIS — E785 Hyperlipidemia, unspecified: Secondary | ICD-10-CM | POA: Diagnosis present

## 2019-07-30 DIAGNOSIS — E119 Type 2 diabetes mellitus without complications: Secondary | ICD-10-CM | POA: Insufficient documentation

## 2019-07-30 DIAGNOSIS — K219 Gastro-esophageal reflux disease without esophagitis: Secondary | ICD-10-CM | POA: Diagnosis present

## 2019-07-30 DIAGNOSIS — K810 Acute cholecystitis: Secondary | ICD-10-CM | POA: Diagnosis not present

## 2019-07-30 DIAGNOSIS — R0789 Other chest pain: Secondary | ICD-10-CM

## 2019-07-30 DIAGNOSIS — R0602 Shortness of breath: Secondary | ICD-10-CM | POA: Diagnosis not present

## 2019-07-30 DIAGNOSIS — K8020 Calculus of gallbladder without cholecystitis without obstruction: Secondary | ICD-10-CM | POA: Diagnosis not present

## 2019-07-30 DIAGNOSIS — J9601 Acute respiratory failure with hypoxia: Secondary | ICD-10-CM | POA: Diagnosis not present

## 2019-07-30 DIAGNOSIS — Z20822 Contact with and (suspected) exposure to covid-19: Secondary | ICD-10-CM | POA: Diagnosis present

## 2019-07-30 DIAGNOSIS — I2510 Atherosclerotic heart disease of native coronary artery without angina pectoris: Secondary | ICD-10-CM | POA: Diagnosis present

## 2019-07-30 DIAGNOSIS — E669 Obesity, unspecified: Secondary | ICD-10-CM | POA: Diagnosis present

## 2019-07-30 DIAGNOSIS — E1122 Type 2 diabetes mellitus with diabetic chronic kidney disease: Secondary | ICD-10-CM | POA: Diagnosis present

## 2019-07-30 DIAGNOSIS — Z87891 Personal history of nicotine dependence: Secondary | ICD-10-CM | POA: Diagnosis not present

## 2019-07-30 DIAGNOSIS — E860 Dehydration: Secondary | ICD-10-CM | POA: Diagnosis present

## 2019-07-30 DIAGNOSIS — R410 Disorientation, unspecified: Secondary | ICD-10-CM | POA: Diagnosis not present

## 2019-07-30 DIAGNOSIS — Z03818 Encounter for observation for suspected exposure to other biological agents ruled out: Secondary | ICD-10-CM | POA: Diagnosis not present

## 2019-07-30 DIAGNOSIS — R079 Chest pain, unspecified: Secondary | ICD-10-CM | POA: Diagnosis not present

## 2019-07-30 DIAGNOSIS — N170 Acute kidney failure with tubular necrosis: Secondary | ICD-10-CM | POA: Diagnosis present

## 2019-07-30 DIAGNOSIS — R072 Precordial pain: Secondary | ICD-10-CM | POA: Diagnosis not present

## 2019-07-30 DIAGNOSIS — K420 Umbilical hernia with obstruction, without gangrene: Secondary | ICD-10-CM | POA: Diagnosis present

## 2019-07-30 DIAGNOSIS — R110 Nausea: Secondary | ICD-10-CM | POA: Diagnosis not present

## 2019-07-30 DIAGNOSIS — Z6833 Body mass index (BMI) 33.0-33.9, adult: Secondary | ICD-10-CM | POA: Diagnosis not present

## 2019-07-30 DIAGNOSIS — K649 Unspecified hemorrhoids: Secondary | ICD-10-CM | POA: Diagnosis not present

## 2019-07-30 DIAGNOSIS — N189 Chronic kidney disease, unspecified: Secondary | ICD-10-CM | POA: Diagnosis not present

## 2019-07-30 DIAGNOSIS — K8010 Calculus of gallbladder with chronic cholecystitis without obstruction: Secondary | ICD-10-CM | POA: Diagnosis not present

## 2019-07-30 DIAGNOSIS — R932 Abnormal findings on diagnostic imaging of liver and biliary tract: Secondary | ICD-10-CM | POA: Diagnosis not present

## 2019-07-30 DIAGNOSIS — I129 Hypertensive chronic kidney disease with stage 1 through stage 4 chronic kidney disease, or unspecified chronic kidney disease: Secondary | ICD-10-CM | POA: Diagnosis present

## 2019-07-30 DIAGNOSIS — J9811 Atelectasis: Secondary | ICD-10-CM | POA: Diagnosis not present

## 2019-07-30 DIAGNOSIS — I25118 Atherosclerotic heart disease of native coronary artery with other forms of angina pectoris: Secondary | ICD-10-CM | POA: Diagnosis not present

## 2019-07-30 DIAGNOSIS — K429 Umbilical hernia without obstruction or gangrene: Secondary | ICD-10-CM | POA: Diagnosis not present

## 2019-07-30 DIAGNOSIS — Z955 Presence of coronary angioplasty implant and graft: Secondary | ICD-10-CM | POA: Diagnosis not present

## 2019-07-30 DIAGNOSIS — I119 Hypertensive heart disease without heart failure: Secondary | ICD-10-CM | POA: Diagnosis not present

## 2019-07-30 DIAGNOSIS — R0600 Dyspnea, unspecified: Secondary | ICD-10-CM | POA: Diagnosis not present

## 2019-07-30 DIAGNOSIS — N183 Chronic kidney disease, stage 3 unspecified: Secondary | ICD-10-CM | POA: Diagnosis not present

## 2019-07-30 DIAGNOSIS — N179 Acute kidney failure, unspecified: Secondary | ICD-10-CM | POA: Diagnosis not present

## 2019-07-30 DIAGNOSIS — I252 Old myocardial infarction: Secondary | ICD-10-CM | POA: Diagnosis not present

## 2019-07-30 DIAGNOSIS — N1832 Chronic kidney disease, stage 3b: Secondary | ICD-10-CM | POA: Diagnosis present

## 2019-07-30 DIAGNOSIS — I10 Essential (primary) hypertension: Secondary | ICD-10-CM | POA: Diagnosis not present

## 2019-07-30 LAB — HEPATIC FUNCTION PANEL
ALT: 17 U/L (ref 0–44)
AST: 19 U/L (ref 15–41)
Albumin: 3.8 g/dL (ref 3.5–5.0)
Alkaline Phosphatase: 52 U/L (ref 38–126)
Bilirubin, Direct: 0.2 mg/dL (ref 0.0–0.2)
Indirect Bilirubin: 0.8 mg/dL (ref 0.3–0.9)
Total Bilirubin: 1 mg/dL (ref 0.3–1.2)
Total Protein: 7.5 g/dL (ref 6.5–8.1)

## 2019-07-30 LAB — CBC
HCT: 40.2 % (ref 39.0–52.0)
Hemoglobin: 13.4 g/dL (ref 13.0–17.0)
MCH: 29.4 pg (ref 26.0–34.0)
MCHC: 33.3 g/dL (ref 30.0–36.0)
MCV: 88.2 fL (ref 80.0–100.0)
Platelets: 194 10*3/uL (ref 150–400)
RBC: 4.56 MIL/uL (ref 4.22–5.81)
RDW: 13.9 % (ref 11.5–15.5)
WBC: 9.8 10*3/uL (ref 4.0–10.5)
nRBC: 0 % (ref 0.0–0.2)

## 2019-07-30 LAB — BASIC METABOLIC PANEL
Anion gap: 10 (ref 5–15)
BUN: 19 mg/dL (ref 8–23)
CO2: 21 mmol/L — ABNORMAL LOW (ref 22–32)
Calcium: 8.9 mg/dL (ref 8.9–10.3)
Chloride: 102 mmol/L (ref 98–111)
Creatinine, Ser: 1.86 mg/dL — ABNORMAL HIGH (ref 0.61–1.24)
GFR calc Af Amer: 42 mL/min — ABNORMAL LOW (ref >60)
GFR calc non Af Amer: 36 mL/min — ABNORMAL LOW (ref >60)
Glucose, Bld: 227 mg/dL — ABNORMAL HIGH (ref 70–99)
Potassium: 4.3 mmol/L (ref 3.5–5.1)
Sodium: 133 mmol/L — ABNORMAL LOW (ref 135–145)

## 2019-07-30 LAB — TROPONIN I (HIGH SENSITIVITY)
Troponin I (High Sensitivity): 5 ng/L (ref <18)
Troponin I (High Sensitivity): 6 ng/L (ref <18)

## 2019-07-30 LAB — LIPASE, BLOOD: Lipase: 25 U/L (ref 11–51)

## 2019-07-30 MED ORDER — SODIUM CHLORIDE 0.9% FLUSH: 3.0000 mL | Freq: Once | INTRAVENOUS | Status: DC

## 2019-07-30 NOTE — ED Provider Notes (Signed)
[facility] EMERGENCY DEPARTMENT Provider Note   CSN: [id number] Arrival date & time: 07/30/19  1516     History Chief Complaint  Patient presents with  . Chest Pain    Gabriel Schultz is a 69 y.o. male.  Patient with a complaint of substernal chest pain radiating at times to the center of his back for the last several days.  But has had the problem before.  Patient states had some nausea and emesis yesterday.  Had severe pain yesterday.  And then also had some increased pain today.  The worst pain today started around noon time.  But it has improved but is still present.  They reported that he was diaphoretic when he came in.  Patient came in by POV.  Patient's been told that this is due to reflux disease in the past and he has been on Protonix.  However patient does have a remote history of coronary artery disease hypertension high cholesterol diabetes had an MI in 2003.  Did have a stent placed at that time.  But has not really followed up with cardiology for a long period of time.  Is a former smoker as well he quit in 1990.  Patient denies any shortness of breath.  Or any leg swelling.  The pain is ongoing today and yesterday is similar to what occurred in the past.        Past Medical History:  Diagnosis Date  . Arthritis   . Coronary artery disease   . Diabetes mellitus without complication ([place])   . GERD (gastroesophageal reflux disease)   . Gout   . High cholesterol   . Hypertension   . Myocardial infarction [facility])    2003    Patient Active Problem List   Diagnosis Date Noted  . Coronary artery disease of native artery of native heart with stable angina pectoris ([place]) 03/13/2019    Past Surgical History:  Procedure Laterality Date  . BIOPSY  05/21/2019   Procedure: BIOPSY;  Surgeon: Daneil Dolin, MD;  [facility];  Service: Endoscopy;;  . COLONOSCOPY N/A 11/04/2018   Procedure: COLONOSCOPY;  Surgeon: Daneil Dolin, MD;  [facility];   Service: Endoscopy;  Laterality: N/A;  1:00  . CORONARY STENT PLACEMENT    . ESOPHAGOGASTRODUODENOSCOPY (EGD) WITH PROPOFOL N/A 05/21/2019   Procedure: ESOPHAGOGASTRODUODENOSCOPY (EGD) WITH PROPOFOL;  Surgeon: Daneil Dolin, MD;  [facility];  Service: Endoscopy;  Laterality: N/A;  9:15am  . HERNIA REPAIR Left    inguinal  . MALONEY DILATION N/A 05/21/2019   Procedure: Venia Minks DILATION;  Surgeon: Daneil Dolin, MD;  [facility];  Service: Endoscopy;  Laterality: N/A;  . POLYPECTOMY  11/04/2018   Procedure: POLYPECTOMY;  Surgeon: Daneil Dolin, MD;  [facility];  Service: Endoscopy;;       Family History  Problem Relation Age of Onset  . Diabetes Mother   . Diabetes Father     Social History   Tobacco Use  . Smoking status: Former Smoker    Packs/day: 1.00    Years: 30.00    Pack years: 30.00    Types: Cigarettes    Quit date: 02/20/1988    Years since quitting: 31.4  . Smokeless tobacco: Former Systems developer    Types: [place] date: 03/18/2016  Vaping Use  . Vaping Use: Never used  Substance Use Topics  . Alcohol use: Not Currently    Comment:  occasionally  . Drug use: No    Home Medications Prior to Admission medications   Medication Sig Start Date End Date Taking? Authorizing Provider  allopurinol (ZYLOPRIM) 100 MG tablet Take 100 mg by mouth every other day. In the evening   Yes [provider]  ALPRAZolam (XANAX) 0.5 MG tablet Take 0.25-0.5 mg by mouth 3 (three) times daily as needed for anxiety.  10/28/18  Yes [provider]  amLODipine (NORVASC) 10 MG tablet Take 10 mg by mouth every evening.    Yes [provider]  aspirin EC 81 MG tablet Take 81 mg by mouth every evening.    Yes [provider]  fluticasone (FLONASE) 50 MCG/ACT nasal spray Place 1-2 sprays into both nostrils 2 (two) times daily as needed for allergies or rhinitis.    Yes [provider]  glipiZIDE (GLUCOTROL XL) 5 MG 24  hr tablet Take 5 mg by mouth every morning. 11/05/16  Yes [provider]  metFORMIN (GLUCOPHAGE) 1000 MG tablet Take 1,000 mg by mouth 2 (two) times daily. 11/05/16  Yes [provider]  methocarbamol (ROBAXIN) 750 MG tablet Take 750 mg by mouth 2 (two) times daily as needed for muscle spasms (back pain).   Yes [provider]  metoprolol succinate (TOPROL-XL) 50 MG 24 hr tablet Take 50 mg by mouth daily.  11/27/16  Yes [provider]  [place] 1200 MG TB12 Take 1,200 mg by mouth daily.    Yes [provider]  naproxen (NAPROSYN) 500 MG tablet Take 500 mg by mouth 2 (two) times daily as needed (back pain/neck stiffness).    Yes [provider]  pantoprazole (PROTONIX) 40 MG tablet Take 40 mg by mouth 2 (two) times daily. 07/28/19  Yes [provider]  pravastatin (PRAVACHOL) 20 MG tablet Take 20 mg by mouth every evening.    Yes [provider]    Allergies    Patient has no known allergies.  Review of Systems   Review of Systems  Constitutional: Negative for chills and fever.  HENT: Negative for congestion, rhinorrhea and sore throat.   Eyes: Negative for visual disturbance.  Respiratory: Negative for cough and shortness of breath.   Cardiovascular: Positive for chest pain. Negative for leg swelling.  Gastrointestinal: Negative for abdominal pain, diarrhea, nausea and vomiting.  Genitourinary: Negative for dysuria.  Musculoskeletal: Negative for back pain and neck pain.  Skin: Negative for rash.  Neurological: Negative for dizziness, light-headedness and headaches.  Hematological: Does not bruise/bleed easily.  Psychiatric/Behavioral: Negative for confusion.    Physical Exam Updated Vital Signs BP (!) 179/85   Pulse 77   Temp 98.8 F (37.1 C) (Oral)   Resp 18   Ht 1.854 m (6\' 1" )   Wt 115.2 kg   SpO2 98%   BMI 33.51 kg/m   Physical Exam Vitals and nursing note reviewed.  Constitutional:        Appearance: Normal appearance. He is well-developed.  HENT:     Head: Normocephalic and atraumatic.  Eyes:     Extraocular Movements: Extraocular movements intact.     Conjunctiva/sclera: Conjunctivae normal.     Pupils: Pupils are equal, round, and reactive to light.  Cardiovascular:     Rate and Rhythm: Normal rate and regular rhythm.     Heart sounds: No murmur heard.   Pulmonary:     Effort: Pulmonary effort is normal. No respiratory distress.     Breath sounds: Normal breath sounds.  Chest:     Chest wall: No tenderness.  Abdominal:     Palpations: Abdomen is soft.     Tenderness: There is no abdominal tenderness.  Musculoskeletal:        General: No swelling. Normal range of motion.     Cervical back: Normal range of motion and neck supple.  Skin:    General: Skin is warm and dry.     Capillary Refill: Capillary refill takes less than 2 seconds.  Neurological:     General: No focal deficit present.     Mental Status: He is alert and oriented to person, place, and time.     ED Results / Procedures / Treatments   Labs (all labs ordered are listed, but only abnormal results are displayed) Labs Reviewed  BASIC METABOLIC PANEL - Abnormal; Notable for the following components:      Result Value   Sodium 133 (*)    CO2 21 (*)    Glucose, Bld 227 (*)    Creatinine, Ser 1.86 (*)    GFR calc non Af Amer 36 (*)    GFR calc Af Amer 42 (*)    All other components within normal limits  CBC  HEPATIC FUNCTION PANEL  LIPASE, BLOOD  TROPONIN I (HIGH SENSITIVITY)  TROPONIN I (HIGH SENSITIVITY)    EKG EKG Interpretation  Date/Time:  Thursday July 30 2019 15:27:23 EDT Ventricular Rate:  83 PR Interval:  164 QRS Duration: 82 QT Interval:  346 QTC Calculation: 406 R Axis:   25 Text Interpretation: Normal sinus rhythm Nonspecific T wave abnormality Abnormal ECG Confirmed by Fredia Sorrow [phone]) on 07/30/2019 4:16:48 PM   Radiology DG Chest 2 View  Result Date:  07/30/2019 CLINICAL DATA:  Midsternal chest pain radiating to back for several days, nausea and emesis EXAM: CHEST - 2 VIEW COMPARISON:  02/25/2019 FINDINGS: Frontal and lateral views of the chest demonstrate an unremarkable cardiac silhouette. No airspace disease, effusion, or pneumothorax. No acute bony abnormalities. IMPRESSION: 1. No acute intrathoracic process. Electronically Signed   By: Randa Ngo M.D.   On: 07/30/2019 16:50    Procedures Procedures (including critical care time)  Medications Ordered in ED Medications  sodium chloride flush (NS) 0.9 % injection 3 mL (has no administration in time range)    ED Course  I have reviewed the triage vital signs and the nursing notes.  Pertinent labs & imaging results that were available during my care of the patient were reviewed by me and considered in my medical decision making (see chart for details).    MDM Rules/Calculators/A&P                         Patient with a remote coronary artery disease history.  Does have a stent that was placed 2003 has not had close follow-up with cardiology but is available to follow-up with cardiology here in the [place] area.  Troponins x2 without any significant abnormalities.  Or change.  Patient does have some renal insufficiency blood sugars were a little on the high side.  Chest x-ray negative.  EKG without any acute changes.  Patient's had pain like this now for several days.  I think with the troponins being both negative that that is reassuring for an acute cardiac event.  But feel that he needs to follow-up with cardiology for consideration may be for stress test.  And patient will make an appointment.  Patient will return for any new or  worse symptoms.   Final Clinical Impression(s) / ED Diagnoses Final diagnoses:  Atypical chest pain    Rx / DC Orders ED Discharge Orders    None       Fredia Sorrow, MD 07/30/19 1935

## 2019-07-30 NOTE — ED Triage Notes (Signed)
Pt reports mid sternal chest pain radiating to center of back for last several days. Pt reports nausea and emesis yesterday. Pt diaphoretic.

## 2019-07-30 NOTE — Discharge Instructions (Addendum)
Due to your past history of coronary artery disease even though today's work-up with 2 - troponins is reassuring I recommend that you make an appointment to follow back up with cardiology information provided you have to call and set up the appointment.  Return for any new or worse symptoms.

## 2019-07-31 ENCOUNTER — Inpatient Hospital Stay (HOSPITAL_COMMUNITY): Payer: PPO | Admitting: Anesthesiology

## 2019-07-31 ENCOUNTER — Inpatient Hospital Stay (HOSPITAL_COMMUNITY): Payer: PPO

## 2019-07-31 ENCOUNTER — Emergency Department (HOSPITAL_COMMUNITY): Payer: PPO

## 2019-07-31 ENCOUNTER — Inpatient Hospital Stay (HOSPITAL_COMMUNITY)
Admission: RE | Admit: 2019-07-31 | Discharge: 2019-08-06 | DRG: 417 | Disposition: A | Discharge status: Home / Self Care | Payer: PPO | Attending: Internal Medicine | Admitting: Internal Medicine

## 2019-07-31 ENCOUNTER — Other Ambulatory Visit: Payer: Self-pay

## 2019-07-31 ENCOUNTER — Encounter (HOSPITAL_COMMUNITY): Payer: Self-pay

## 2019-07-31 ENCOUNTER — Encounter (HOSPITAL_COMMUNITY)
Admission: RE | Disposition: A | Discharge status: Home / Self Care | Payer: Self-pay | Source: Home / Self Care | Attending: Internal Medicine

## 2019-07-31 DIAGNOSIS — N189 Chronic kidney disease, unspecified: Secondary | ICD-10-CM

## 2019-07-31 DIAGNOSIS — N179 Acute kidney failure, unspecified: Secondary | ICD-10-CM

## 2019-07-31 DIAGNOSIS — E7800 Pure hypercholesterolemia, unspecified: Secondary | ICD-10-CM | POA: Diagnosis present

## 2019-07-31 DIAGNOSIS — Z87891 Personal history of nicotine dependence: Secondary | ICD-10-CM

## 2019-07-31 DIAGNOSIS — Z791 Long term (current) use of non-steroidal anti-inflammatories (NSAID): Secondary | ICD-10-CM

## 2019-07-31 DIAGNOSIS — M1A9XX0 Chronic gout, unspecified, without tophus (tophi): Secondary | ICD-10-CM | POA: Diagnosis present

## 2019-07-31 DIAGNOSIS — Z6833 Body mass index (BMI) 33.0-33.9, adult: Secondary | ICD-10-CM

## 2019-07-31 DIAGNOSIS — G8929 Other chronic pain: Secondary | ICD-10-CM | POA: Diagnosis present

## 2019-07-31 DIAGNOSIS — Z955 Presence of coronary angioplasty implant and graft: Secondary | ICD-10-CM

## 2019-07-31 DIAGNOSIS — E669 Obesity, unspecified: Secondary | ICD-10-CM | POA: Diagnosis present

## 2019-07-31 DIAGNOSIS — R0600 Dyspnea, unspecified: Secondary | ICD-10-CM | POA: Diagnosis not present

## 2019-07-31 DIAGNOSIS — E860 Dehydration: Secondary | ICD-10-CM | POA: Diagnosis present

## 2019-07-31 DIAGNOSIS — K420 Umbilical hernia with obstruction, without gangrene: Secondary | ICD-10-CM | POA: Diagnosis present

## 2019-07-31 DIAGNOSIS — N1832 Chronic kidney disease, stage 3b: Secondary | ICD-10-CM | POA: Diagnosis present

## 2019-07-31 DIAGNOSIS — K219 Gastro-esophageal reflux disease without esophagitis: Secondary | ICD-10-CM | POA: Diagnosis present

## 2019-07-31 DIAGNOSIS — I2510 Atherosclerotic heart disease of native coronary artery without angina pectoris: Secondary | ICD-10-CM | POA: Diagnosis present

## 2019-07-31 DIAGNOSIS — Z7984 Long term (current) use of oral hypoglycemic drugs: Secondary | ICD-10-CM | POA: Diagnosis not present

## 2019-07-31 DIAGNOSIS — I129 Hypertensive chronic kidney disease with stage 1 through stage 4 chronic kidney disease, or unspecified chronic kidney disease: Secondary | ICD-10-CM | POA: Diagnosis present

## 2019-07-31 DIAGNOSIS — K810 Acute cholecystitis: Principal | ICD-10-CM | POA: Diagnosis present

## 2019-07-31 DIAGNOSIS — J9811 Atelectasis: Secondary | ICD-10-CM | POA: Diagnosis not present

## 2019-07-31 DIAGNOSIS — Z20822 Contact with and (suspected) exposure to covid-19: Secondary | ICD-10-CM | POA: Diagnosis present

## 2019-07-31 DIAGNOSIS — E1122 Type 2 diabetes mellitus with diabetic chronic kidney disease: Secondary | ICD-10-CM | POA: Diagnosis present

## 2019-07-31 DIAGNOSIS — K82A1 Gangrene of gallbladder in cholecystitis: Secondary | ICD-10-CM | POA: Diagnosis present

## 2019-07-31 DIAGNOSIS — E785 Hyperlipidemia, unspecified: Secondary | ICD-10-CM | POA: Diagnosis present

## 2019-07-31 DIAGNOSIS — N170 Acute kidney failure with tubular necrosis: Secondary | ICD-10-CM | POA: Diagnosis present

## 2019-07-31 DIAGNOSIS — Z79899 Other long term (current) drug therapy: Secondary | ICD-10-CM

## 2019-07-31 DIAGNOSIS — K649 Unspecified hemorrhoids: Secondary | ICD-10-CM | POA: Diagnosis present

## 2019-07-31 DIAGNOSIS — Z7982 Long term (current) use of aspirin: Secondary | ICD-10-CM | POA: Diagnosis not present

## 2019-07-31 DIAGNOSIS — I252 Old myocardial infarction: Secondary | ICD-10-CM | POA: Diagnosis not present

## 2019-07-31 DIAGNOSIS — R410 Disorientation, unspecified: Secondary | ICD-10-CM | POA: Diagnosis not present

## 2019-07-31 DIAGNOSIS — K429 Umbilical hernia without obstruction or gangrene: Secondary | ICD-10-CM | POA: Diagnosis present

## 2019-07-31 DIAGNOSIS — J9601 Acute respiratory failure with hypoxia: Secondary | ICD-10-CM | POA: Diagnosis not present

## 2019-07-31 HISTORY — PX: CHOLECYSTECTOMY: SHX55

## 2019-07-31 HISTORY — PX: UMBILICAL HERNIA REPAIR: SHX196

## 2019-07-31 LAB — CBC WITH DIFFERENTIAL/PLATELET
Abs Immature Granulocytes: 0.03 10*3/uL (ref 0.00–0.07)
Basophils Absolute: 0 10*3/uL (ref 0.0–0.1)
Basophils Relative: 0 %
Eosinophils Absolute: 0 10*3/uL (ref 0.0–0.5)
Eosinophils Relative: 0 %
HCT: 41.1 % (ref 39.0–52.0)
Hemoglobin: 13.5 g/dL (ref 13.0–17.0)
Immature Granulocytes: 0 %
Lymphocytes Relative: 19 %
Lymphs Abs: 2.3 10*3/uL (ref 0.7–4.0)
MCH: 29.3 pg (ref 26.0–34.0)
MCHC: 32.8 g/dL (ref 30.0–36.0)
MCV: 89.3 fL (ref 80.0–100.0)
Monocytes Absolute: 1.3 10*3/uL — ABNORMAL HIGH (ref 0.1–1.0)
Monocytes Relative: 11 %
Neutro Abs: 8.5 10*3/uL — ABNORMAL HIGH (ref 1.7–7.7)
Neutrophils Relative %: 70 %
Platelets: 235 10*3/uL (ref 150–400)
RBC: 4.6 MIL/uL (ref 4.22–5.81)
RDW: 14 % (ref 11.5–15.5)
WBC: 12.1 10*3/uL — ABNORMAL HIGH (ref 4.0–10.5)
nRBC: 0 % (ref 0.0–0.2)

## 2019-07-31 LAB — URINALYSIS, ROUTINE W REFLEX MICROSCOPIC
Bilirubin Urine: NEGATIVE
Glucose, UA: 50 mg/dL — AB
Ketones, ur: NEGATIVE mg/dL
Leukocytes,Ua: NEGATIVE
Nitrite: NEGATIVE
Protein, ur: >=300 mg/dL — AB
Specific Gravity, Urine: 1.022 (ref 1.005–1.030)
pH: 5 (ref 5.0–8.0)

## 2019-07-31 LAB — COMPREHENSIVE METABOLIC PANEL
ALT: 16 U/L (ref 0–44)
AST: 14 U/L — ABNORMAL LOW (ref 15–41)
Albumin: 3.7 g/dL (ref 3.5–5.0)
Alkaline Phosphatase: 52 U/L (ref 38–126)
Anion gap: 15 (ref 5–15)
BUN: 19 mg/dL (ref 8–23)
CO2: 21 mmol/L — ABNORMAL LOW (ref 22–32)
Calcium: 9 mg/dL (ref 8.9–10.3)
Chloride: 99 mmol/L (ref 98–111)
Creatinine, Ser: 2.05 mg/dL — ABNORMAL HIGH (ref 0.61–1.24)
GFR calc Af Amer: 37 mL/min — ABNORMAL LOW (ref >60)
GFR calc non Af Amer: 32 mL/min — ABNORMAL LOW (ref >60)
Glucose, Bld: 210 mg/dL — ABNORMAL HIGH (ref 70–99)
Potassium: 4.4 mmol/L (ref 3.5–5.1)
Sodium: 135 mmol/L (ref 135–145)
Total Bilirubin: 1.4 mg/dL — ABNORMAL HIGH (ref 0.3–1.2)
Total Protein: 7.7 g/dL (ref 6.5–8.1)

## 2019-07-31 LAB — HEMOGLOBIN A1C
Hgb A1c MFr Bld: 7.4 % — ABNORMAL HIGH (ref 4.8–5.6)
Mean Plasma Glucose: 165.68 mg/dL

## 2019-07-31 LAB — SARS CORONAVIRUS 2 BY RT PCR (HOSPITAL ORDER, PERFORMED IN ~~LOC~~ HOSPITAL LAB): SARS Coronavirus 2: NEGATIVE

## 2019-07-31 LAB — GLUCOSE, CAPILLARY
Glucose-Capillary: 195 mg/dL — ABNORMAL HIGH (ref 70–99)
Glucose-Capillary: 224 mg/dL — ABNORMAL HIGH (ref 70–99)

## 2019-07-31 LAB — HIV ANTIBODY (ROUTINE TESTING W REFLEX): HIV Screen 4th Generation wRfx: NONREACTIVE

## 2019-07-31 LAB — TROPONIN I (HIGH SENSITIVITY): Troponin I (High Sensitivity): 6 ng/L (ref <18)

## 2019-07-31 LAB — CBG MONITORING, ED: Glucose-Capillary: 182 mg/dL — ABNORMAL HIGH (ref 70–99)

## 2019-07-31 LAB — LIPASE, BLOOD: Lipase: 20 U/L (ref 11–51)

## 2019-07-31 SURGERY — LAPAROSCOPIC CHOLECYSTECTOMY
Anesthesia: General | Site: Abdomen

## 2019-07-31 MED ORDER — BUPIVACAINE HCL (PF) 0.5 % IJ SOLN
INTRAMUSCULAR | Status: DC | PRN
  Administered 2019-07-31: 20 mL

## 2019-07-31 MED ORDER — ACETAMINOPHEN 325 MG PO TABS: 650.0000 mg | ORAL_TABLET | Freq: Four times a day (QID) | ORAL | Status: DC | PRN

## 2019-07-31 MED ORDER — HEMOSTATIC AGENTS (NO CHARGE) OPTIME
TOPICAL | Status: DC | PRN
  Administered 2019-07-31 (×3): 1 via TOPICAL

## 2019-07-31 MED ORDER — MIDAZOLAM HCL 2 MG/2ML IJ SOLN
INTRAMUSCULAR | Status: AC
  Filled 2019-07-31: qty 2

## 2019-07-31 MED ORDER — ROCURONIUM BROMIDE 10 MG/ML (PF) SYRINGE
PREFILLED_SYRINGE | INTRAVENOUS | Status: AC
  Filled 2019-07-31: qty 10

## 2019-07-31 MED ORDER — INSULIN ASPART 100 UNIT/ML ~~LOC~~ SOLN
0.0000 [IU] | SUBCUTANEOUS | Status: DC
  Administered 2019-07-31: 3 [IU] via SUBCUTANEOUS
  Administered 2019-07-31: 5 [IU] via SUBCUTANEOUS
  Administered 2019-08-01: 3 [IU] via SUBCUTANEOUS
  Administered 2019-08-01 (×2): 2 [IU] via SUBCUTANEOUS
  Administered 2019-08-01 – 2019-08-02 (×5): 3 [IU] via SUBCUTANEOUS
  Administered 2019-08-02: 5 [IU] via SUBCUTANEOUS
  Administered 2019-08-02: 2 [IU] via SUBCUTANEOUS
  Administered 2019-08-03 (×6): 3 [IU] via SUBCUTANEOUS
  Administered 2019-08-04: 5 [IU] via SUBCUTANEOUS
  Administered 2019-08-04 – 2019-08-05 (×5): 3 [IU] via SUBCUTANEOUS
  Administered 2019-08-05: 5 [IU] via SUBCUTANEOUS
  Administered 2019-08-05 – 2019-08-06 (×2): 3 [IU] via SUBCUTANEOUS
  Administered 2019-08-06: 2 [IU] via SUBCUTANEOUS
  Administered 2019-08-06: 3 [IU] via SUBCUTANEOUS
  Administered 2019-08-06: 5 [IU] via SUBCUTANEOUS
  Administered 2019-08-06: 3 [IU] via SUBCUTANEOUS
  Filled 2019-07-31: qty 1

## 2019-07-31 MED ORDER — ONDANSETRON HCL 4 MG/2ML IJ SOLN: 4.0000 mg | Freq: Four times a day (QID) | INTRAMUSCULAR | Status: DC | PRN

## 2019-07-31 MED ORDER — SODIUM CHLORIDE 0.9 % IR SOLN
Status: DC | PRN
  Administered 2019-07-31: 1000 mL

## 2019-07-31 MED ORDER — ONDANSETRON HCL 4 MG PO TABS: 4.0000 mg | ORAL_TABLET | Freq: Four times a day (QID) | ORAL | Status: DC | PRN

## 2019-07-31 MED ORDER — SODIUM CHLORIDE 0.9 % IV SOLN
2.0000 g | INTRAVENOUS | Status: AC
  Administered 2019-07-31: 2 g via INTRAVENOUS
  Filled 2019-07-31: qty 2

## 2019-07-31 MED ORDER — LABETALOL HCL 5 MG/ML IV SOLN: 10.0000 mg | INTRAVENOUS | Status: DC | PRN

## 2019-07-31 MED ORDER — CHLORHEXIDINE GLUCONATE 4 % EX LIQD
1.0000 "application " | Freq: Once | CUTANEOUS | Status: DC
  Filled 2019-07-31: qty 15

## 2019-07-31 MED ORDER — PROPOFOL 10 MG/ML IV BOLUS
INTRAVENOUS | Status: DC | PRN
  Administered 2019-07-31: 150 mg via INTRAVENOUS

## 2019-07-31 MED ORDER — FENTANYL CITRATE (PF) 100 MCG/2ML IJ SOLN
INTRAMUSCULAR | Status: AC
  Filled 2019-07-31: qty 2

## 2019-07-31 MED ORDER — PHENYLEPHRINE HCL (PRESSORS) 10 MG/ML IV SOLN
INTRAVENOUS | Status: DC | PRN
  Administered 2019-07-31: 80 ug via INTRAVENOUS
  Administered 2019-07-31 (×3): 40 ug via INTRAVENOUS
  Administered 2019-07-31 (×2): 80 ug via INTRAVENOUS
  Administered 2019-07-31: 40 ug via INTRAVENOUS

## 2019-07-31 MED ORDER — HYDROMORPHONE HCL 1 MG/ML IJ SOLN: 0.5000 mg | INTRAMUSCULAR | Status: DC | PRN

## 2019-07-31 MED ORDER — SODIUM CHLORIDE 0.9 % IV SOLN: 2.0000 g | INTRAVENOUS | Status: DC

## 2019-07-31 MED ORDER — BUPIVACAINE HCL (PF) 0.5 % IJ SOLN
INTRAMUSCULAR | Status: AC
  Filled 2019-07-31: qty 30

## 2019-07-31 MED ORDER — ONDANSETRON HCL 4 MG/2ML IJ SOLN
INTRAMUSCULAR | Status: AC
  Filled 2019-07-31: qty 2

## 2019-07-31 MED ORDER — LACTATED RINGERS IV SOLN: INTRAVENOUS | Status: DC

## 2019-07-31 MED ORDER — DEXAMETHASONE SODIUM PHOSPHATE 10 MG/ML IJ SOLN
INTRAMUSCULAR | Status: AC
  Filled 2019-07-31: qty 1

## 2019-07-31 MED ORDER — SODIUM CHLORIDE 0.9 % IV SOLN
2.0000 g | INTRAVENOUS | Status: AC
  Administered 2019-08-01 – 2019-08-02 (×2): 2 g via INTRAVENOUS
  Filled 2019-07-31 (×2): qty 20

## 2019-07-31 MED ORDER — ONDANSETRON HCL 4 MG/2ML IJ SOLN
4.0000 mg | Freq: Once | INTRAMUSCULAR | Status: AC
  Administered 2019-07-31: 4 mg via INTRAVENOUS
  Filled 2019-07-31: qty 2

## 2019-07-31 MED ORDER — PANTOPRAZOLE SODIUM 40 MG PO TBEC: 40.0000 mg | DELAYED_RELEASE_TABLET | Freq: Once | ORAL | Status: DC

## 2019-07-31 MED ORDER — PANTOPRAZOLE SODIUM 40 MG IV SOLR
40.0000 mg | INTRAVENOUS | Status: DC
  Administered 2019-08-01 – 2019-08-05 (×4): 40 mg via INTRAVENOUS
  Filled 2019-07-31 (×5): qty 40

## 2019-07-31 MED ORDER — FENTANYL CITRATE (PF) 100 MCG/2ML IJ SOLN
INTRAMUSCULAR | Status: AC
  Filled 2019-07-31: qty 4

## 2019-07-31 MED ORDER — ACETAMINOPHEN 650 MG RE SUPP: 650.0000 mg | Freq: Four times a day (QID) | RECTAL | Status: DC | PRN

## 2019-07-31 MED ORDER — FENTANYL CITRATE (PF) 100 MCG/2ML IJ SOLN
100.0000 ug | INTRAMUSCULAR | Status: DC | PRN
  Administered 2019-07-31: 100 ug via INTRAVENOUS
  Filled 2019-07-31: qty 2

## 2019-07-31 MED ORDER — OXYCODONE HCL 5 MG PO TABS
5.0000 mg | ORAL_TABLET | ORAL | Status: DC | PRN
  Administered 2019-08-01: 10 mg via ORAL
  Administered 2019-08-01 (×2): 5 mg via ORAL
  Administered 2019-08-03 (×2): 10 mg via ORAL
  Administered 2019-08-06: 5 mg via ORAL
  Filled 2019-07-31: qty 1
  Filled 2019-07-31 (×2): qty 2
  Filled 2019-07-31: qty 1
  Filled 2019-07-31 (×2): qty 2

## 2019-07-31 MED ORDER — CHLORHEXIDINE GLUCONATE 0.12 % MT SOLN
15.0000 mL | Freq: Once | OROMUCOSAL | Status: AC
  Administered 2019-07-31: 15 mL via OROMUCOSAL

## 2019-07-31 MED ORDER — SUCCINYLCHOLINE CHLORIDE 200 MG/10ML IV SOSY
PREFILLED_SYRINGE | INTRAVENOUS | Status: AC
  Filled 2019-07-31: qty 10

## 2019-07-31 MED ORDER — FENTANYL CITRATE (PF) 100 MCG/2ML IJ SOLN
INTRAMUSCULAR | Status: DC | PRN
  Administered 2019-07-31 (×3): 50 ug via INTRAVENOUS
  Administered 2019-07-31: 100 ug via INTRAVENOUS
  Administered 2019-07-31: 50 ug via INTRAVENOUS

## 2019-07-31 MED ORDER — SODIUM CHLORIDE 0.9 % IV SOLN
2.0000 g | Freq: Once | INTRAVENOUS | Status: AC
  Administered 2019-07-31: 2 g via INTRAVENOUS
  Filled 2019-07-31: qty 20

## 2019-07-31 MED ORDER — SUCCINYLCHOLINE CHLORIDE 20 MG/ML IJ SOLN
INTRAMUSCULAR | Status: DC | PRN
  Administered 2019-07-31: 120 mg via INTRAVENOUS

## 2019-07-31 MED ORDER — HEPARIN SODIUM (PORCINE) 5000 UNIT/ML IJ SOLN
5000.0000 [IU] | Freq: Three times a day (TID) | INTRAMUSCULAR | Status: DC
  Administered 2019-07-31 – 2019-08-06 (×15): 5000 [IU] via SUBCUTANEOUS
  Filled 2019-07-31 (×17): qty 1

## 2019-07-31 MED ORDER — MORPHINE SULFATE (PF) 4 MG/ML IV SOLN
4.0000 mg | Freq: Once | INTRAVENOUS | Status: AC
  Administered 2019-07-31: 4 mg via INTRAVENOUS
  Filled 2019-07-31: qty 1

## 2019-07-31 MED ORDER — EPHEDRINE SULFATE-NACL 50-0.9 MG/10ML-% IV SOSY
PREFILLED_SYRINGE | INTRAVENOUS | Status: DC | PRN
  Administered 2019-07-31: 5 mg via INTRAVENOUS

## 2019-07-31 MED ORDER — PANTOPRAZOLE SODIUM 40 MG IV SOLR
40.0000 mg | Freq: Once | INTRAVENOUS | Status: AC
  Administered 2019-07-31: 40 mg via INTRAVENOUS
  Filled 2019-07-31: qty 40

## 2019-07-31 MED ORDER — SUGAMMADEX SODIUM 500 MG/5ML IV SOLN
INTRAVENOUS | Status: DC | PRN
  Administered 2019-07-31: 200 mg via INTRAVENOUS

## 2019-07-31 MED ORDER — ROCURONIUM BROMIDE 100 MG/10ML IV SOLN
INTRAVENOUS | Status: DC | PRN
  Administered 2019-07-31: 40 mg via INTRAVENOUS
  Administered 2019-07-31: 20 mg via INTRAVENOUS

## 2019-07-31 MED ORDER — ONDANSETRON HCL 4 MG/2ML IJ SOLN
INTRAMUSCULAR | Status: DC | PRN
  Administered 2019-07-31: 4 mg via INTRAVENOUS

## 2019-07-31 MED ORDER — PROPOFOL 10 MG/ML IV BOLUS
INTRAVENOUS | Status: AC
  Filled 2019-07-31: qty 20

## 2019-07-31 MED ORDER — MORPHINE SULFATE (PF) 2 MG/ML IV SOLN
2.0000 mg | INTRAVENOUS | Status: DC | PRN
  Administered 2019-08-01: 2 mg via INTRAVENOUS
  Filled 2019-07-31: qty 1

## 2019-07-31 SURGICAL SUPPLY — 63 items
APPLICATOR ARISTA FLEXITIP XL (MISCELLANEOUS) ×2 IMPLANT
APPLIER CLIP ROT 10 11.4 M/L (STAPLE) ×4
BAG RETRIEVAL 10 (BASKET) ×1
BAG RETRIEVAL 10MM (BASKET) ×1
BLADE SURG 15 STRL LF DISP TIS (BLADE) ×2 IMPLANT
BLADE SURG 15 STRL SS (BLADE) ×4
CHLORAPREP W/TINT 26 (MISCELLANEOUS) ×4 IMPLANT
CLIP APPLIE ROT 10 11.4 M/L (STAPLE) ×2 IMPLANT
CLOTH BEACON ORANGE TIMEOUT ST (SAFETY) ×4 IMPLANT
COVER LIGHT HANDLE STERIS (MISCELLANEOUS) ×8 IMPLANT
COVER WAND RF STERILE (DRAPES) ×4 IMPLANT
DECANTER SPIKE VIAL GLASS SM (MISCELLANEOUS) ×4 IMPLANT
DERMABOND ADVANCED (GAUZE/BANDAGES/DRESSINGS) ×2
DERMABOND ADVANCED .7 DNX12 (GAUZE/BANDAGES/DRESSINGS) ×2 IMPLANT
DRSG ALLEVYN 2X2 (GAUZE/BANDAGES/DRESSINGS) ×2 IMPLANT
DRSG GAUZE PETRO 3X36 STRIP (GAUZE/BANDAGES/DRESSINGS) ×2 IMPLANT
DRSG OPSITE POSTOP 4X8 (GAUZE/BANDAGES/DRESSINGS) ×2 IMPLANT
DRSG TEGADERM 2-3/8X2-3/4 SM (GAUZE/BANDAGES/DRESSINGS) ×2 IMPLANT
ELECT REM PT RETURN 9FT ADLT (ELECTROSURGICAL) ×4
ELECTRODE REM PT RTRN 9FT ADLT (ELECTROSURGICAL) ×2 IMPLANT
GLOVE BIO SURGEON STRL SZ 6.5 (GLOVE) ×3 IMPLANT
GLOVE BIO SURGEON STRL SZ7 (GLOVE) ×2 IMPLANT
GLOVE BIO SURGEONS STRL SZ 6.5 (GLOVE) ×1
GLOVE BIOGEL PI IND STRL 6.5 (GLOVE) ×2 IMPLANT
GLOVE BIOGEL PI IND STRL 7.0 (GLOVE) ×6 IMPLANT
GLOVE BIOGEL PI INDICATOR 6.5 (GLOVE) ×2
GLOVE BIOGEL PI INDICATOR 7.0 (GLOVE) ×6
GLOVE ECLIPSE 6.5 STRL STRAW (GLOVE) ×2 IMPLANT
GLOVE ECLIPSE 7.0 STRL STRAW (GLOVE) ×2 IMPLANT
GOWN STRL REUS W/TWL LRG LVL3 (GOWN DISPOSABLE) ×12 IMPLANT
HEMOSTAT ARISTA ABSORB 3G PWDR (HEMOSTASIS) ×2 IMPLANT
HEMOSTAT SNOW SURGICEL 2X4 (HEMOSTASIS) ×6 IMPLANT
INST SET LAPROSCOPIC AP (KITS) ×4 IMPLANT
IV NS IRRIG 3000ML ARTHROMATIC (IV SOLUTION) ×2 IMPLANT
KIT TURNOVER KIT A (KITS) ×4 IMPLANT
MANIFOLD NEPTUNE II (INSTRUMENTS) ×4 IMPLANT
NDL INSUFFLATION 14GA 120MM (NEEDLE) ×2 IMPLANT
NEEDLE INSUFFLATION 14GA 120MM (NEEDLE) ×4 IMPLANT
NS IRRIG 1000ML POUR BTL (IV SOLUTION) ×4 IMPLANT
PACK LAP CHOLE LZT030E (CUSTOM PROCEDURE TRAY) ×4 IMPLANT
PAD ARMBOARD 7.5X6 YLW CONV (MISCELLANEOUS) ×4 IMPLANT
PENCIL HANDSWITCHING (ELECTRODE) ×2 IMPLANT
SET BASIN LINEN APH (SET/KITS/TRAYS/PACK) ×4 IMPLANT
SET TUBE IRRIG SUCTION NO TIP (IRRIGATION / IRRIGATOR) ×2 IMPLANT
SET TUBE SMOKE EVAC HIGH FLOW (TUBING) ×4 IMPLANT
SLEEVE ENDOPATH XCEL 5M (ENDOMECHANICALS) ×4 IMPLANT
SPONGE GAUZE 2X2 8PLY STER LF (GAUZE/BANDAGES/DRESSINGS) ×3
SPONGE GAUZE 2X2 8PLY STRL LF (GAUZE/BANDAGES/DRESSINGS) ×3 IMPLANT
STAPLER VISISTAT (STAPLE) ×2 IMPLANT
SUT ETHIBOND 0 MO6 C/R (SUTURE) ×4 IMPLANT
SUT MNCRL AB 4-0 PS2 18 (SUTURE) ×10 IMPLANT
SUT VIC AB 3-0 SH 27 (SUTURE) ×4
SUT VIC AB 3-0 SH 27X BRD (SUTURE) IMPLANT
SUT VICRYL 0 UR6 27IN ABS (SUTURE) ×6 IMPLANT
SYS BAG RETRIEVAL 10MM (BASKET) ×2
SYSTEM BAG RETRIEVAL 10MM (BASKET) ×2 IMPLANT
TROCAR BALLN 12MMX100 BLUNT (TROCAR) ×2 IMPLANT
TROCAR ENDO BLADELESS 11MM (ENDOMECHANICALS) ×4 IMPLANT
TROCAR XCEL NON-BLD 5MMX100MML (ENDOMECHANICALS) ×4 IMPLANT
TROCAR XCEL UNIV SLVE 11M 100M (ENDOMECHANICALS) ×4 IMPLANT
TUBE CONNECTING 12'X1/4 (SUCTIONS) ×1
TUBE CONNECTING 12X1/4 (SUCTIONS) ×3 IMPLANT
WARMER LAPAROSCOPE (MISCELLANEOUS) ×4 IMPLANT

## 2019-07-31 NOTE — Progress Notes (Signed)
[facility]  Notified his wife that surgery complete and lap chole done and primary repair of umbilical hernia.  Clears, PRN for pain, abdominal binder.  Curlene Labrum, MD [facility] [address] [place], [place] [address] [phone] (office)

## 2019-07-31 NOTE — ED Triage Notes (Signed)
Pt reports ruq pain x 2 days.  Reports was here for same yesterday.   C/O nausea, no vomiting.

## 2019-07-31 NOTE — Anesthesia Procedure Notes (Addendum)
Procedure Name: Intubation Date/Time: 07/31/2019 4:11 PM Performed by: Vista Deck, CRNA Pre-anesthesia Checklist: Patient identified, Patient being monitored, Timeout performed, Emergency Drugs available and Suction available Patient Re-evaluated:Patient Re-evaluated prior to induction Oxygen Delivery Method: Circle System Utilized Preoxygenation: Pre-oxygenation with 100% oxygen Induction Type: IV induction Laryngoscope Size: Mac and 3 Grade View: Grade II Tube type: Oral Tube size: 7.5 mm Number of attempts: 1 Airway Equipment and Method: stylet Placement Confirmation: ETT inserted through vocal cords under direct vision,  positive ETCO2 and breath sounds checked- equal and bilateral Secured at: 22 cm Tube secured with: Tape Dental Injury: Teeth and Oropharynx as per pre-operative assessment

## 2019-07-31 NOTE — Transfer of Care (Signed)
Immediate Anesthesia Transfer of Care Note  Patient: Gabriel Schultz  Procedure(s) Performed: LAPAROSCOPIC CHOLECYSTECTOMY (N/A Abdomen) HERNIA REPAIR UMBILICAL ADULT (Abdomen)  Patient Location: PACU  Anesthesia Type:General  Level of Consciousness: sedated, drowsy and responds to stimulation  Airway & Oxygen Therapy: Patient Spontanous Breathing and Patient connected to face mask oxygen  Post-op Assessment: Report given to RN and Post -op Vital signs reviewed and stable  Post vital signs: Reviewed and stable  Last Vitals:  Vitals Value Taken Time  BP 102/63 07/31/19 1824  Temp    Pulse 72 07/31/19 1830  Resp 22 07/31/19 1830  SpO2 95 % 07/31/19 1830  Vitals shown include unvalidated device data.  Last Pain:  Vitals:   07/31/19 1821  TempSrc:   PainSc: (P) Asleep         Complications: No complications documented.

## 2019-07-31 NOTE — Anesthesia Preprocedure Evaluation (Signed)
Anesthesia Evaluation  Patient identified by MRN, date of birth, ID band Patient awake    Reviewed: Allergy & Precautions, NPO status , Patient's Chart, lab work & pertinent test results  Airway Mallampati: II  TM Distance: >3 FB Neck ROM: Full    Dental  (+) Edentulous Upper, Edentulous Lower   Pulmonary former smoker,   Taking shallow breaths due to pain Pulmonary exam normal breath sounds clear to auscultation       Cardiovascular hypertension, Pt. on medications + angina with exertion + CAD, + Past MI and + Cardiac Stents  Normal cardiovascular exam Rate:Normal     Neuro/Psych    GI/Hepatic GERD  Medicated,  Endo/Other  diabetes, Well Controlled, Type 2, Oral Hypoglycemic Agents  Renal/GU      Musculoskeletal  (+) Arthritis ,   Abdominal   Peds  Hematology   Anesthesia Other Findings   Reproductive/Obstetrics                             Anesthesia Physical Anesthesia Plan  ASA: III and emergent  Anesthesia Plan: General   Post-op Pain Management:    Induction: Intravenous  PONV Risk Score and Plan: 4 or greater and Ondansetron, Dexamethasone and Midazolam  Airway Management Planned: Oral ETT  Additional Equipment:   Intra-op Plan:   Post-operative Plan: Extubation in OR  Informed Consent: I have reviewed the patients History and Physical, chart, labs and discussed the procedure including the risks, benefits and alternatives for the proposed anesthesia with the patient or authorized representative who has indicated his/her understanding and acceptance.     Dental advisory given  Plan Discussed with: CRNA and Surgeon  Anesthesia Plan Comments:         Anesthesia Quick Evaluation

## 2019-07-31 NOTE — ED Notes (Signed)
Pt respirations increased with O2 sats 88%, [place] placed on pt at 2L, O2 sats 89-90%, EDP aware

## 2019-07-31 NOTE — H&P (Signed)
[place]  Reason for Consult: Acute cholecystitis  Referring Physician:  Dr. Manuella Ghazi   Chief Complaint    Abdominal Pain      HPI: Gabriel Schultz is a 69 y.o. male with a history of CAD s/p MI in the past, no recent chest pain or SOB, DM, HTN, reported epigastric pain /RUQ pain and associated nausea and vomiting. He had been to his PCP earlier this week and then to the ED yesterday. He was worked up for cardiac issues and this was negative. He was sent home. He returned with worsening abdominal pain and nausea/vomiting. He says he has not really ate anything in the last few days. He says this AM the pain was sharp and constant. He has not been able to get much relief.   Today in the Ed he has a leukocytosis and [place] is consistent with acute cholecystitis.   Past Medical History:  Diagnosis Date  . Arthritis   . Coronary artery disease   . Diabetes mellitus without complication ([place])   . GERD (gastroesophageal reflux disease)   . Gout   . High cholesterol   . Hypertension   . Myocardial infarction [facility])    2003    Past Surgical History:  Procedure Laterality Date  . BIOPSY  05/21/2019   Procedure: BIOPSY;  Surgeon: Daneil Dolin, MD;  [facility]: [facility];  Service: Endoscopy;;  . COLONOSCOPY N/A 11/04/2018   Procedure: COLONOSCOPY;  Surgeon: Daneil Dolin, MD;  [facility];  Service: Endoscopy;  Laterality: N/A;  1:00  . CORONARY STENT PLACEMENT    . ESOPHAGOGASTRODUODENOSCOPY (EGD) WITH PROPOFOL N/A 05/21/2019   Procedure: ESOPHAGOGASTRODUODENOSCOPY (EGD) WITH PROPOFOL;  Surgeon: Daneil Dolin, MD;  [facility];  Service: Endoscopy;  Laterality: N/A;  9:15am  . HERNIA REPAIR Left    inguinal  . MALONEY DILATION N/A 05/21/2019   Procedure: Venia Minks DILATION;  Surgeon: Daneil Dolin, MD;  [facility];  Service: Endoscopy;  Laterality: N/A;  . POLYPECTOMY  11/04/2018   Procedure: POLYPECTOMY;  Surgeon: Daneil Dolin, MD;  [facility];  Service: Endoscopy;;    Family History  Problem Relation Age of Onset  . Diabetes Mother   . Diabetes Father     Social History   Tobacco Use  . Smoking status: Former Smoker    Packs/day: 1.00    Years: 30.00    Pack years: 30.00    Types: Cigarettes    Quit date: 02/20/1988    Years since quitting: 31.4  . Smokeless tobacco: Former Systems developer    Types: [place] date: 03/18/2016  Vaping Use  . Vaping Use: Never used  Substance Use Topics  . Alcohol use: Not Currently    Comment: occasionally  . Drug use: No    Medications: I have reviewed the patient's current medications. Current Facility-Administered Medications  Medication Dose Route Frequency Provider Last Rate Last Admin  . acetaminophen (TYLENOL) tablet 650 mg  650 mg Oral Q6H PRN Manuella Ghazi, Pratik D, DO       Or  . acetaminophen (TYLENOL) suppository 650 mg  650 mg Rectal Q6H PRN Manuella Ghazi, Pratik D, DO      . [START ON 08/01/2019] cefoTEtan (CEFOTAN) 2 g in sodium chloride 0.9 % 100 mL IVPB  2 g Intravenous On Call to OR Virl Cagey, MD      . Derrill Memo ON 08/01/2019] cefTRIAXone (ROCEPHIN) 2 g in  sodium chloride 0.9 % 100 mL IVPB  2 g Intravenous Q24H Manuella Ghazi, Pratik D, DO      . chlorhexidine (HIBICLENS) 4 % liquid 1 application  1 application Topical Once Virl Cagey, MD      . heparin injection 5,000 Units  5,000 Units Subcutaneous Q8H Shah, Pratik D, DO      . HYDROmorphone (DILAUDID) injection 0.5 mg  0.5 mg Intravenous Q3H PRN Manuella Ghazi, Pratik D, DO      . insulin aspart (novoLOG) injection 0-15 Units  0-15 Units Subcutaneous Q4H Shah, Pratik D, DO      . labetalol (NORMODYNE) injection 10 mg  10 mg Intravenous Q2H PRN Manuella Ghazi, Pratik D, DO      . lactated ringers infusion   Intravenous Continuous Manuella Ghazi, Pratik D, DO      . ondansetron (ZOFRAN) tablet 4 mg  4 mg Oral Q6H PRN Manuella Ghazi, Pratik D, DO       Or  . ondansetron (ZOFRAN) injection 4 mg  4 mg Intravenous Q6H PRN Manuella Ghazi, Pratik D,  DO      . [START ON 08/01/2019] pantoprazole (PROTONIX) injection 40 mg  40 mg Intravenous Q24H Manuella Ghazi, Pratik D, DO       Current Outpatient Medications  Medication Sig Dispense Refill Last Dose  . allopurinol (ZYLOPRIM) 100 MG tablet Take 100 mg by mouth every other day. In the evening     . ALPRAZolam (XANAX) 0.5 MG tablet Take 0.25-0.5 mg by mouth 3 (three) times daily as needed for anxiety.      [name] amLODipine (NORVASC) 10 MG tablet Take 10 mg by mouth every evening.      [name] aspirin EC 81 MG tablet Take 81 mg by mouth every evening.      . fluticasone (FLONASE) 50 MCG/ACT nasal spray Place 1-2 sprays into both nostrils 2 (two) times daily as needed for allergies or rhinitis.      [name] glipiZIDE (GLUCOTROL XL) 5 MG 24 hr tablet Take 5 mg by mouth every morning.     . metFORMIN (GLUCOPHAGE) 1000 MG tablet Take 1,000 mg by mouth 2 (two) times daily.     . methocarbamol (ROBAXIN) 750 MG tablet Take 750 mg by mouth 2 (two) times daily as needed for muscle spasms (back pain).     . metoprolol succinate (TOPROL-XL) 50 MG 24 hr tablet Take 50 mg by mouth daily.      [name] MUCINEX MAXIMUM STRENGTH 1200 MG TB12 Take 1,200 mg by mouth daily.      . naproxen (NAPROSYN) 500 MG tablet Take 500 mg by mouth 2 (two) times daily as needed (back pain/neck stiffness).      . pantoprazole (PROTONIX) 40 MG tablet Take 40 mg by mouth 2 (two) times daily.     . pravastatin (PRAVACHOL) 20 MG tablet Take 20 mg by mouth every evening.        No Known Allergies   ROS:  A comprehensive review of systems was negative except for: Constitutional: positive for anorexia and malaise Gastrointestinal: positive for abdominal pain, nausea and vomiting  Blood pressure (!) 143/79, pulse 87, temperature 97.7 F (36.5 C), temperature source Oral, resp. rate (!) 26, height 6\' 1"  (1.854 m), weight 115 kg, SpO2 90 %. Physical Exam Vitals reviewed.  Constitutional:      Appearance: He is obese.  HENT:     Head: Normocephalic and  atraumatic.  Eyes:     Extraocular Movements: Extraocular movements intact.  Cardiovascular:  Rate and Rhythm: Normal rate and regular rhythm.  Pulmonary:     Effort: Pulmonary effort is normal.     Comments: Shallow breaths Abdominal:     General: There is distension.     Palpations: Abdomen is soft.     Tenderness: There is abdominal tenderness in the right upper quadrant and epigastric area. There is no guarding or rebound.     Hernia: A hernia is present. Hernia is present in the umbilical area.     Comments: Reducible umbilical hernia, nontender  Musculoskeletal:     Comments: No leg swelling, moving extremities   Skin:    General: Skin is warm and dry.  Neurological:     General: No focal deficit present.     Mental Status: He is alert and oriented to person, place, and time.  Psychiatric:        Mood and Affect: Mood normal.        Behavior: Behavior normal.     Results: Results for orders placed or performed during the hospital encounter of 07/31/19 (from the past 48 hour(s))  Comprehensive metabolic panel     Status: Abnormal   Collection Time: 07/31/19  9:50 AM  Result Value Ref Range   Sodium 135 135 - 145 mmol/L   Potassium 4.4 3.5 - 5.1 mmol/L   Chloride 99 98 - 111 mmol/L   CO2 21 (L) 22 - 32 mmol/L   Glucose, Bld 210 (H) 70 - 99 mg/dL    Comment: Glucose reference range applies only to samples taken after fasting for at least 8 hours.   BUN 19 8 - 23 mg/dL   Creatinine, Ser 2.05 (H) 0.61 - 1.24 mg/dL   Calcium 9.0 8.9 - 10.3 mg/dL   Total Protein 7.7 6.5 - 8.1 g/dL   Albumin 3.7 3.5 - 5.0 g/dL   AST 14 (L) 15 - 41 U/L   ALT 16 0 - 44 U/L   Alkaline Phosphatase 52 38 - 126 U/L   Total Bilirubin 1.4 (H) 0.3 - 1.2 mg/dL   GFR calc non Af Amer 32 (L) >60 mL/min   GFR calc Af Amer 37 (L) >60 mL/min   Anion gap 15 5 - 15    Comment: Performed at [facility], [address] [place], [place] [address]  CBC with Differential/Platelet     Status: Abnormal    Collection Time: 07/31/19  9:50 AM  Result Value Ref Range   WBC 12.1 (H) 4.0 - 10.5 K/uL   RBC 4.60 4.22 - 5.81 MIL/uL   Hemoglobin 13.5 13.0 - 17.0 g/dL   HCT 41.1 39 - 52 %   MCV 89.3 80.0 - 100.0 fL   MCH 29.3 26.0 - 34.0 pg   MCHC 32.8 30.0 - 36.0 g/dL   RDW 14.0 11.5 - 15.5 %   Platelets 235 150 - 400 K/uL   nRBC 0.0 0.0 - 0.2 %   Neutrophils Relative % 70 %   Neutro Abs 8.5 (H) 1.7 - 7.7 K/uL   Lymphocytes Relative 19 %   Lymphs Abs 2.3 0.7 - 4.0 K/uL   Monocytes Relative 11 %   Monocytes Absolute 1.3 (H) 0 - 1 K/uL   Eosinophils Relative 0 %   Eosinophils Absolute 0.0 0 - 0 K/uL   Basophils Relative 0 %   Basophils Absolute 0.0 0 - 0 K/uL   Immature Granulocytes 0 %   Abs Immature Granulocytes 0.03 0.00 - 0.07 K/uL    Comment: Performed  at [facility], [address], [place], [place] [address]  Lipase, blood     Status: None   Collection Time: 07/31/19  9:50 AM  Result Value Ref Range   Lipase 20 11 - 51 U/L    Comment: Performed at [facility], [address], [place], [place] [address]  Troponin I (High Sensitivity)     Status: None   Collection Time: 07/31/19  9:50 AM  Result Value Ref Range   Troponin I (High Sensitivity) 6 <18 ng/L    Comment: (NOTE) Elevated high sensitivity troponin I (hsTnI) values and significant  changes across serial measurements may suggest ACS but many other  chronic and acute conditions are known to elevate hsTnI results.  Refer to the "Links" section for chest pain algorithms and additional  guidance. Performed at [facility], [address], [place], [place] [address]   Urinalysis, Routine w reflex microscopic     Status: Abnormal   Collection Time: 07/31/19 10:16 AM  Result Value Ref Range   Color, Urine YELLOW YELLOW   APPearance HAZY (A) CLEAR   Specific Gravity, Urine 1.022 1.005 - 1.030   pH 5.0 5.0 - 8.0   Glucose, UA 50 (A) NEGATIVE mg/dL   Hgb urine dipstick SMALL (A) NEGATIVE   Bilirubin Urine NEGATIVE NEGATIVE    Ketones, ur NEGATIVE NEGATIVE mg/dL   Protein, ur >=300 (A) NEGATIVE mg/dL   Nitrite NEGATIVE NEGATIVE   Leukocytes,Ua NEGATIVE NEGATIVE   RBC / HPF 0-5 0 - 5 RBC/hpf   WBC, UA 0-5 0 - 5 WBC/hpf   Bacteria, UA RARE (A) NONE SEEN   Squamous Epithelial / LPF 0-5 0 - 5   Mucus PRESENT     Comment: Performed at [facility], [address] [place], [place] [address]  SARS Coronavirus 2 by RT PCR (hospital order, performed in [place] hospital lab) Nasopharyngeal Nasopharyngeal Swab     Status: None   Collection Time: 07/31/19 12:09 PM   Specimen: Nasopharyngeal Swab  Result Value Ref Range   SARS Coronavirus 2 NEGATIVE NEGATIVE    Comment: (NOTE) SARS-CoV-2 target nucleic acids are NOT DETECTED.  The SARS-CoV-2 RNA is generally detectable in upper and lower respiratory specimens during the acute phase of infection. The lowest concentration of SARS-CoV-2 viral copies this assay can detect is 250 copies / mL. A negative result does not preclude SARS-CoV-2 infection and should not be used as the sole basis for treatment or other patient management decisions.  A negative result may occur with improper specimen collection / handling, submission of specimen other than nasopharyngeal swab, presence of viral mutation(s) within the areas targeted by this assay, and inadequate number of viral copies (<250 copies / mL). A negative result must be combined with clinical observations, patient history, and epidemiological information.  Fact Sheet for Patients:   StrictlyIdeas.no  Fact Sheet for Healthcare Providers: BankingDealers.co.za  This test is not yet approved or  cleared by the [place] FDA and has been authorized for detection and/or diagnosis of SARS-CoV-2 by FDA under an Emergency Use Authorization (EUA).  This EUA will remain in effect (meaning this test can be used) for the duration of the COVID-19 declaration under Section  564(b)(1) of the Act, 21 U.S.C. section 360bbb-3(b)(1), unless the authorization is terminated or revoked sooner.  Performed at [facility], [address] [place], [place] [address]    Personally reviewed- gallbladder with stones, and thickening, CXR with low lung volumes   DG Chest 2 View  Result Date: 07/30/2019 CLINICAL DATA:  Midsternal chest pain radiating to back for several days, nausea and emesis EXAM: CHEST - 2 VIEW COMPARISON:  02/25/2019 FINDINGS: Frontal and lateral views of the chest demonstrate an unremarkable cardiac silhouette. No airspace disease, effusion, or pneumothorax. No acute bony abnormalities. IMPRESSION: 1. No acute intrathoracic process. Electronically Signed   By: Randa Ngo M.D.   On: 07/30/2019 16:50   US Abdomen Limited  Result Date: 07/31/2019 CLINICAL DATA:  RIGHT upper quadrant pain for 2 days, nausea, vomiting EXAM: ULTRASOUND ABDOMEN LIMITED RIGHT UPPER QUADRANT COMPARISON:  CT abdomen pelvis 09/29/2015 FINDINGS: Gallbladder: No normal appearing gallbladder identified. At expected position of gallbladder fossa, multiple echogenic foci are identified with posterior shadowing. Patient denies prior cholecystectomy, and a gallbladder was present on the prior CT exam from 2017. This likely represents a gallbladder filled with calculi creating a wall echo shadow complex. Sonographic Percell Miller sign is present. Probable edema surrounding gallbladder. Unable to measure wall thickness. Findings are highly suspicious for acute cholecystitis. Common bile duct: Diameter: 7 mm Liver: Heterogeneous increased echogenicity of the liver likely fatty infiltration of this can be seen with cirrhosis and certain infiltrative disorders. No focal hepatic mass or nodularity. Portal vein is patent on color Doppler imaging with normal direction of blood flow towards the liver. Other: No RIGHT upper quadrant free fluid. IMPRESSION: Suspected gallbladder filled with calculi creating a wall  echo shadow complex. Pericholecystic fluid and sonographic Murphy sign as well as potential mild wall thickening. Findings are highly suspicious for cholelithiasis and acute cholecystitis. Electronically Signed   By: Lavonia Dana M.D.   On: 07/31/2019 10:44   DG Chest Port 1 View  Result Date: 07/31/2019 CLINICAL DATA:  Shortness of breath nausea. EXAM: PORTABLE CHEST 1 VIEW COMPARISON:  07/30/2019 FINDINGS: Artifact overlies the chest. Poor inspiration. Heart size is normal. Chronic interstitial lung markings, probably slightly more prominent because of the poor inspiration. Evidence of consolidation or lobar collapse. No effusion. IMPRESSION: Poor inspiration. Chronic interstitial lung markings which appear slightly more prominent, probably due to the inspiration. No focal consolidation or collapse. Electronically Signed   By: Nelson Chimes M.D.   On: 07/31/2019 11:52     Assessment & Plan:  Gabriel Schultz is a 69 y.o. male with a acute cholecystitis and with AKI from dehydration. He was worked up in the ED for possible cardiac source but this was negative. He reports that he had the same epigastric/ RUQ pain when he came to the ED yesterday.   -PLAN: I counseled the patient about the indication, risks and benefits of laparoscopic cholecystectomy.  He understands there is a very small chance for bleeding, infection, injury to normal structures (including common bile duct), conversion to open surgery, persistent symptoms, evolution of postcholecystectomy diarrhea, need for secondary interventions, anesthesia reaction, cardiopulmonary issues and other risks not specifically detailed here. I described the expected recovery, the plan for follow-up and the restrictions during the recovery phase.  All questions were answered.  -Preop orders done -Umbilical hernia is large and reducible, nontender, will plan to fix this primarily when he gets the gallbladder done   -Hospitalist admission   All questions  were answered to the satisfaction of the patient and family.    Virl Cagey 07/31/2019, 1:07 PM

## 2019-07-31 NOTE — H&P (Signed)
History and Physical    HARRY SHUCK KXF:[id number] DOB: 1950-07-02 DOA: 07/31/2019  PCP: Cory Munch, PA-C   Patient coming from: Home  Chief Complaint: Abdominal pain with dry heaves  HPI: Gabriel Schultz is a 69 y.o. male with medical history significant for CAD with prior MI in 2003, prior tobacco abuse, hypertension, dyslipidemia, chronic gout, type 2 diabetes, and GERD who presented to the ED initially on 6/10 with complaints of some epigastric and chest pain.  He was noted to have some radiation of pain to the center of his back and had some nausea and vomiting.  He was sent home after he was ruled out for acute coronary syndrome.  He presented back today after he woke up with severe right upper quadrant abdominal pain with some dry heaving.  There appears to be no exacerbating or alleviating factors and no radiation of the pain.  He denies any fevers, chills, diarrhea, constipation, shortness of breath, or chest pain.   ED Course: Stable vital signs noted and patient is afebrile.  Mild leukocytosis at 12,000 noted and creatinine 2.05 with baseline approximately 1.7-1.8.  Blood glucose level 210.  Signs of cholecystitis noted on ultrasound with pericholecystic fluid as well as some cholelithiasis.  Positive Murphy sign as well.  EKG with sinus rhythm.  2 view chest x-ray with no acute findings noted.  General surgery has been notified and plans to try to take to the OR today if possible.  Plan to keep n.p.o. and on IV fluids as well as IV Rocephin.  Review of Systems: All others reviewed as noted above and otherwise negative.  Past Medical History:  Diagnosis Date   Arthritis    Coronary artery disease    Diabetes mellitus without complication (HCC)    GERD (gastroesophageal reflux disease)    Gout    High cholesterol    Hypertension    Myocardial infarction [facility])    2003    Past Surgical History:  Procedure Laterality Date   BIOPSY  05/21/2019   Procedure:  BIOPSY;  Surgeon: Daneil Dolin, MD;  [facility]: [facility];  Service: Endoscopy;;   COLONOSCOPY N/A 11/04/2018   Procedure: COLONOSCOPY;  Surgeon: Daneil Dolin, MD;  [facility]: [facility];  Service: Endoscopy;  Laterality: N/A;  1:00   CORONARY STENT PLACEMENT     ESOPHAGOGASTRODUODENOSCOPY (EGD) WITH PROPOFOL N/A 05/21/2019   Procedure: ESOPHAGOGASTRODUODENOSCOPY (EGD) WITH PROPOFOL;  Surgeon: Daneil Dolin, MD;  Location: [facility];  Service: Endoscopy;  Laterality: N/A;  9:15am   HERNIA REPAIR Left    inguinal   MALONEY DILATION N/A 05/21/2019   Procedure: Venia Minks DILATION;  Surgeon: Daneil Dolin, MD;  Location: [facility];  Service: Endoscopy;  Laterality: N/A;   POLYPECTOMY  11/04/2018   Procedure: POLYPECTOMY;  Surgeon: Daneil Dolin, MD;  [facility]: [facility];  Service: Endoscopy;;     reports that he quit smoking about 31 years ago. His smoking use included cigarettes. He has a 30.00 pack-year smoking history. He quit smokeless tobacco use about 3 years ago.  His smokeless tobacco use included chew. He reports previous alcohol use. He reports that he does not use drugs.  No Known Allergies  Family History  Problem Relation Age of Onset   Diabetes Mother    Diabetes Father     Prior to Admission medications   Medication Sig Start Date End Date Taking? Authorizing Provider  allopurinol (ZYLOPRIM) 100 MG tablet Take 100  mg by mouth every other day. In the evening    [provider]  ALPRAZolam Duanne Moron) 0.5 MG tablet Take 0.25-0.5 mg by mouth 3 (three) times daily as needed for anxiety.  10/28/18   [provider]  amLODipine (NORVASC) 10 MG tablet Take 10 mg by mouth every evening.     [provider]  aspirin EC 81 MG tablet Take 81 mg by mouth every evening.     [provider]  fluticasone (FLONASE) 50 MCG/ACT nasal spray Place 1-2 sprays into both nostrils 2 (two) times daily as needed for allergies or rhinitis.      [provider]  glipiZIDE (GLUCOTROL XL) 5 MG 24 hr tablet Take 5 mg by mouth every morning. 11/05/16   [provider]  metFORMIN (GLUCOPHAGE) 1000 MG tablet Take 1,000 mg by mouth 2 (two) times daily. 11/05/16   [provider]  methocarbamol (ROBAXIN) 750 MG tablet Take 750 mg by mouth 2 (two) times daily as needed for muscle spasms (back pain).    [provider]  metoprolol succinate (TOPROL-XL) 50 MG 24 hr tablet Take 50 mg by mouth daily.  11/27/16   [provider]  MUCINEX MAXIMUM STRENGTH 1200 MG TB12 Take 1,200 mg by mouth daily.     [provider]  naproxen (NAPROSYN) 500 MG tablet Take 500 mg by mouth 2 (two) times daily as needed (back pain/neck stiffness).     [provider]  pantoprazole (PROTONIX) 40 MG tablet Take 40 mg by mouth 2 (two) times daily. 07/28/19   [provider]  pravastatin (PRAVACHOL) 20 MG tablet Take 20 mg by mouth every evening.     [provider]    Physical Exam: Vitals:   07/31/19 0943 07/31/19 0949 07/31/19 1105 07/31/19 1110  BP:  (!) 143/79    Pulse: 86  86 87  Resp: 16  (!) 30 (!) 26  Temp: 97.7 F (36.5 C)     TempSrc: Oral     SpO2: 97%  (!) 86% 90%  Weight:      Height:        Constitutional: NAD, calm, comfortable Vitals:   07/31/19 0943 07/31/19 0949 07/31/19 1105 07/31/19 1110  BP:  (!) 143/79    Pulse: 86  86 87  Resp: 16  (!) 30 (!) 26  Temp: 97.7 F (36.5 C)     TempSrc: Oral     SpO2: 97%  (!) 86% 90%  Weight:      Height:       Eyes: lids and conjunctivae normal ENMT: Mucous membranes are moist.  Neck: normal, supple Respiratory: clear to auscultation bilaterally. Normal respiratory effort. No accessory muscle use.  Currently on nasal cannula oxygen. Cardiovascular: Regular rate and rhythm, no murmurs. No extremity edema. Abdomen: Minimal tenderness to palpation over right upper quadrant.  No guarding rigidity or rebound.  Umbilical hernia  present. Musculoskeletal:  No joint deformity upper and lower extremities.   Skin: no rashes, lesions, ulcers.  Psychiatric: Normal judgment and insight. Alert and oriented x 3. Normal mood.   Labs on Admission: I have personally reviewed following labs and imaging studies  CBC: Recent Labs  Lab 07/30/19 1535 07/31/19 0950  WBC 9.8 12.1*  NEUTROABS  --  8.5*  HGB 13.4 13.5  HCT 40.2 41.1  MCV 88.2 89.3  PLT 194 983   Basic Metabolic Panel: Recent Labs  Lab 07/30/19 1535 07/31/19 0950  NA 133* 135  K 4.3  4.4  CL 102 99  CO2 21* 21*  GLUCOSE 227* 210*  BUN 19 19  CREATININE 1.86* 2.05*  CALCIUM 8.9 9.0   GFR: Estimated Creatinine Clearance: 45.2 mL/min (A) (by C-G formula based on SCr of 2.05 mg/dL (H)). Liver Function Tests: Recent Labs  Lab 07/30/19 1535 07/31/19 0950  AST 19 14*  ALT 17 16  ALKPHOS 52 52  BILITOT 1.0 1.4*  PROT 7.5 7.7  ALBUMIN 3.8 3.7   Recent Labs  Lab 07/30/19 1535 07/31/19 0950  LIPASE 25 20   No results for input(s): AMMONIA in the last 168 hours. Coagulation Profile: No results for input(s): INR, PROTIME in the last 168 hours. Cardiac Enzymes: No results for input(s): CKTOTAL, CKMB, CKMBINDEX, TROPONINI in the last 168 hours. BNP (last 3 results) No results for input(s): PROBNP in the last 8760 hours. HbA1C: No results for input(s): HGBA1C in the last 72 hours. CBG: No results for input(s): GLUCAP in the last 168 hours. Lipid Profile: No results for input(s): CHOL, HDL, LDLCALC, TRIG, CHOLHDL, LDLDIRECT in the last 72 hours. Thyroid Function Tests: No results for input(s): TSH, T4TOTAL, FREET4, T3FREE, THYROIDAB in the last 72 hours. Anemia Panel: No results for input(s): VITAMINB12, FOLATE, FERRITIN, TIBC, IRON, RETICCTPCT in the last 72 hours. Urine analysis:    Component Value Date/Time   COLORURINE YELLOW 07/31/2019 1016   APPEARANCEUR HAZY (A) 07/31/2019 1016   LABSPEC 1.022 07/31/2019 1016   PHURINE 5.0  07/31/2019 1016   GLUCOSEU 50 (A) 07/31/2019 1016   HGBUR SMALL (A) 07/31/2019 1016   BILIRUBINUR NEGATIVE 07/31/2019 1016   KETONESUR NEGATIVE 07/31/2019 1016   PROTEINUR >=300 (A) 07/31/2019 1016   UROBILINOGEN 0.2 07/26/2006 2130   NITRITE NEGATIVE 07/31/2019 1016   LEUKOCYTESUR NEGATIVE 07/31/2019 1016    Radiological Exams on Admission: DG Chest 2 View  Result Date: 07/30/2019 CLINICAL DATA:  Midsternal chest pain radiating to back for several days, nausea and emesis EXAM: CHEST - 2 VIEW COMPARISON:  02/25/2019 FINDINGS: Frontal and lateral views of the chest demonstrate an unremarkable cardiac silhouette. No airspace disease, effusion, or pneumothorax. No acute bony abnormalities. IMPRESSION: 1. No acute intrathoracic process. Electronically Signed   By: Randa Ngo M.D.   On: 07/30/2019 16:50   US Abdomen Limited  Result Date: 07/31/2019 CLINICAL DATA:  RIGHT upper quadrant pain for 2 days, nausea, vomiting EXAM: ULTRASOUND ABDOMEN LIMITED RIGHT UPPER QUADRANT COMPARISON:  CT abdomen pelvis 09/29/2015 FINDINGS: Gallbladder: No normal appearing gallbladder identified. At expected position of gallbladder fossa, multiple echogenic foci are identified with posterior shadowing. Patient denies prior cholecystectomy, and a gallbladder was present on the prior CT exam from 2017. This likely represents a gallbladder filled with calculi creating a wall echo shadow complex. Sonographic Percell Miller sign is present. Probable edema surrounding gallbladder. Unable to measure wall thickness. Findings are highly suspicious for acute cholecystitis. Common bile duct: Diameter: 7 mm Liver: Heterogeneous increased echogenicity of the liver likely fatty infiltration of this can be seen with cirrhosis and certain infiltrative disorders. No focal hepatic mass or nodularity. Portal vein is patent on color Doppler imaging with normal direction of blood flow towards the liver. Other: No RIGHT upper quadrant free fluid.  IMPRESSION: Suspected gallbladder filled with calculi creating a wall echo shadow complex. Pericholecystic fluid and sonographic Murphy sign as well as potential mild wall thickening. Findings are highly suspicious for cholelithiasis and acute cholecystitis. Electronically Signed   By: Lavonia Dana M.D.   On: 07/31/2019 10:44  DG Chest Port 1 View  Result Date: 07/31/2019 CLINICAL DATA:  Shortness of breath nausea. EXAM: PORTABLE CHEST 1 VIEW COMPARISON:  07/30/2019 FINDINGS: Artifact overlies the chest. Poor inspiration. Heart size is normal. Chronic interstitial lung markings, probably slightly more prominent because of the poor inspiration. Evidence of consolidation or lobar collapse. No effusion. IMPRESSION: Poor inspiration. Chronic interstitial lung markings which appear slightly more prominent, probably due to the inspiration. No focal consolidation or collapse. Electronically Signed   By: Nelson Chimes M.D.   On: 07/31/2019 11:52    EKG: Independently reviewed. 83bpm NSR.  Assessment/Plan Active Problems:   Acute cholecystitis    Acute cholecystitis -Discussed with general surgery Dr. Constance Haw with plans for definitive surgical management either later today or on Monday -Keep n.p.o. and on IV fluid -Continue daily IV Rocephin -Pain management with IV Dilaudid as needed -IV Zofran as needed  History of CAD with prior MI in 2003/hypertension/dyslipidemia -No chest pain and currently with some soft blood pressure readings -Hold home metoprolol -Hold amlodipine as well as statin -Hold aspirin for now  History of chronic gout -Hold allopurinol for now  History of type 2 diabetes with noted hyperglycemia -Placed on SSI with Accu-Cheks every 4 hours -Hold home Metformin and glipizide  History of GERD -IV PPI daily  Obesity -BMI 33.45 kg/m -Recommend lifestyle changes  DVT prophylaxis: Heparin Code Status: Full code Family Communication: Discussed with brother at  bedside Disposition Plan: Admit for management of acute cholecystitis Consults called: General surgery Admission status: Inpatient, MedSurg Status is: Inpatient  Remains inpatient appropriate because:Ongoing diagnostic testing needed not appropriate for outpatient work up and IV treatments appropriate due to intensity of illness or inability to take PO   Dispo: The patient is from: Home              Anticipated d/c is to: Home              Anticipated d/c date is: 1 day              Patient currently is not medically stable to d/c.   Juanangel Soderholm D Manuella Ghazi DO [facility]  If 7PM-7AM, please contact night-coverage [email or web address]  07/31/2019, 12:17 PM

## 2019-07-31 NOTE — Progress Notes (Signed)
Incentive placed in room

## 2019-07-31 NOTE — Anesthesia Postprocedure Evaluation (Signed)
Anesthesia Post Note  Patient: Gabriel Schultz  Procedure(s) Performed: LAPAROSCOPIC CHOLECYSTECTOMY (N/A Abdomen) HERNIA REPAIR UMBILICAL ADULT (Abdomen)  Patient location during evaluation: PACU Anesthesia Type: General Level of consciousness: awake and alert and oriented Pain management: pain level controlled Vital Signs Assessment: post-procedure vital signs reviewed and stable Respiratory status: spontaneous breathing and patient connected to nasal cannula oxygen (spo2 at preop levels) Cardiovascular status: blood pressure returned to baseline and stable Postop Assessment: no apparent nausea or vomiting Anesthetic complications: no   No complications documented.   Last Vitals:  Vitals:   07/31/19 1850 07/31/19 1900  BP:  127/76  Pulse: 75 77  Resp: (!) 28 (!) 24  Temp:    SpO2: 91% 91%    Last Pain:  Vitals:   07/31/19 1859  TempSrc:   PainSc: 0-No pain                 Merle Whitehorn C Shalay Carder

## 2019-07-31 NOTE — ED Provider Notes (Signed)
Presented [facility] Provider Note   CSN: [id number] Arrival date & time: 07/31/19  9379     History Chief Complaint  Patient presents with  . Abdominal Pain    Gabriel Schultz is a 69 y.o. male.  HPI   This patient is a 69 year old male with a known history of acid reflux, he has had a prior myocardial infarction in 2003, he has known coronary disease and is a diabetic.  The hospital yesterday with a complaint of epigastric and chest pain.  This seem to radiate to the center of his back according to the notes from the prior visit.  There was some nausea and vomiting.  He has been told that he has had acid reflux in the past that has caused this.  Yesterday he had no abdominal tenderness.  He was noted to have a creatinine of 1.86 yesterday.  This is gradually gone up over time but not a significant jump.  His CBC showed no signs of leukocytosis or anemia.  His liver function was totally normal, he had a normal lipase and 2 normal troponins.  He reports that he was feeling well when he first woke up this morning but shortly thereafter he developed a severe right upper quadrant pain associated with epigastric discomfort and multiple episodes of nausea with no vomiting though he was dry heaving several times.  The pain is been persistent, it does not radiate, there is no associated diarrhea constipation or difficulty urinating.  At this time his symptoms are severe.  The patient has not had any prior abdominal surgery other than an inguinal hernia repair many years ago.  He has a known umbilical hernia  Past Medical History:  Diagnosis Date  . Arthritis   . Coronary artery disease   . Diabetes mellitus without complication ([place])   . GERD (gastroesophageal reflux disease)   . Gout   . High cholesterol   . Hypertension   . Myocardial infarction [facility])    2003    Patient Active Problem List   Diagnosis Date Noted  . Coronary artery disease of native artery of  native heart with stable angina pectoris ([place]) 03/13/2019    Past Surgical History:  Procedure Laterality Date  . BIOPSY  05/21/2019   Procedure: BIOPSY;  Surgeon: Daneil Dolin, MD;  [facility];  Service: Endoscopy;;  . COLONOSCOPY N/A 11/04/2018   Procedure: COLONOSCOPY;  Surgeon: Daneil Dolin, MD;  [facility];  Service: Endoscopy;  Laterality: N/A;  1:00  . CORONARY STENT PLACEMENT    . ESOPHAGOGASTRODUODENOSCOPY (EGD) WITH PROPOFOL N/A 05/21/2019   Procedure: ESOPHAGOGASTRODUODENOSCOPY (EGD) WITH PROPOFOL;  Surgeon: Daneil Dolin, MD;  [facility];  Service: Endoscopy;  Laterality: N/A;  9:15am  . HERNIA REPAIR Left    inguinal  . MALONEY DILATION N/A 05/21/2019   Procedure: Venia Minks DILATION;  Surgeon: Daneil Dolin, MD;  [facility];  Service: Endoscopy;  Laterality: N/A;  . POLYPECTOMY  11/04/2018   Procedure: POLYPECTOMY;  Surgeon: Daneil Dolin, MD;  [facility];  Service: Endoscopy;;       Family History  Problem Relation Age of Onset  . Diabetes Mother   . Diabetes Father     Social History   Tobacco Use  . Smoking status: Former Smoker    Packs/day: 1.00    Years: 30.00    Pack years: 30.00    Types: Cigarettes    Quit  date: 02/20/1988    Years since quitting: 31.4  . Smokeless tobacco: Former Systems developer    Types: [place] date: 03/18/2016  Vaping Use  . Vaping Use: Never used  Substance Use Topics  . Alcohol use: Not Currently    Comment: occasionally  . Drug use: No    Home Medications Prior to Admission medications   Medication Sig Start Date End Date Taking? Authorizing Provider  allopurinol (ZYLOPRIM) 100 MG tablet Take 100 mg by mouth every other day. In the evening    [provider]  ALPRAZolam Duanne Moron) 0.5 MG tablet Take 0.25-0.5 mg by mouth 3 (three) times daily as needed for anxiety.  10/28/18   [provider]  amLODipine (NORVASC) 10 MG tablet Take 10 mg by mouth  every evening.     [provider]  aspirin EC 81 MG tablet Take 81 mg by mouth every evening.     [provider]  fluticasone (FLONASE) 50 MCG/ACT nasal spray Place 1-2 sprays into both nostrils 2 (two) times daily as needed for allergies or rhinitis.     [provider]  glipiZIDE (GLUCOTROL XL) 5 MG 24 hr tablet Take 5 mg by mouth every morning. 11/05/16   [provider]  metFORMIN (GLUCOPHAGE) 1000 MG tablet Take 1,000 mg by mouth 2 (two) times daily. 11/05/16   [provider]  methocarbamol (ROBAXIN) 750 MG tablet Take 750 mg by mouth 2 (two) times daily as needed for muscle spasms (back pain).    [provider]  metoprolol succinate (TOPROL-XL) 50 MG 24 hr tablet Take 50 mg by mouth daily.  11/27/16   [provider]  MUCINEX MAXIMUM STRENGTH 1200 MG TB12 Take 1,200 mg by mouth daily.     [provider]  naproxen (NAPROSYN) 500 MG tablet Take 500 mg by mouth 2 (two) times daily as needed (back pain/neck stiffness).     [provider]  pantoprazole (PROTONIX) 40 MG tablet Take 40 mg by mouth 2 (two) times daily. 07/28/19   [provider]  pravastatin (PRAVACHOL) 20 MG tablet Take 20 mg by mouth every evening.     [provider]    Allergies    Patient has no known allergies.  Review of Systems   Review of Systems  All other systems reviewed and are negative.   Physical Exam Updated Vital Signs BP (!) 143/79 (BP Location: Left Arm)   Pulse 86   Temp 97.7 F (36.5 C) (Oral)   Resp 16   Ht 1.854 m (6\' 1" )   Wt 115 kg   SpO2 97%   BMI 33.45 kg/m   Physical Exam Vitals and nursing note reviewed.  Constitutional:      General: He is not in acute distress.    Appearance: He is well-developed.  HENT:     Head: Normocephalic and atraumatic.     Mouth/Throat:     Pharynx: No oropharyngeal exudate.  Eyes:     General: No scleral icterus.       Right eye: No discharge.         Left eye: No discharge.     Conjunctiva/sclera: Conjunctivae normal.     Pupils: Pupils are equal, round, and reactive to light.  Neck:     Thyroid: No thyromegaly.     Vascular: No JVD.  Cardiovascular:     Rate and Rhythm: Normal rate and regular rhythm.     Heart sounds: Normal heart sounds.  No murmur heard.  No friction rub. No gallop.   Pulmonary:     Effort: Pulmonary effort is normal. No respiratory distress.     Breath sounds: Normal breath sounds. No wheezing or rales.  Abdominal:     General: Bowel sounds are normal. There is no distension.     Palpations: Abdomen is soft. There is no mass.     Tenderness: There is abdominal tenderness.     Comments: The patient has an easily reducible nontender umbilical hernia.  There is no lower abdominal tenderness, he is obese.  He does have a positive Murphy sign and upper abdominal tenderness with epigastric tenderness as well with mild guarding.  No peritoneal signs  Musculoskeletal:        General: No tenderness. Normal range of motion.     Cervical back: Normal range of motion and neck supple.  Lymphadenopathy:     Cervical: No cervical adenopathy.  Skin:    General: Skin is warm and dry.     Findings: No erythema or rash.  Neurological:     Mental Status: He is alert.     Coordination: Coordination normal.  Psychiatric:        Behavior: Behavior normal.     ED Results / Procedures / Treatments   Labs (all labs ordered are listed, but only abnormal results are displayed) Labs Reviewed  COMPREHENSIVE METABOLIC PANEL - Abnormal; Notable for the following components:      Result Value   CO2 21 (*)    Glucose, Bld 210 (*)    Creatinine, Ser 2.05 (*)    AST 14 (*)    Total Bilirubin 1.4 (*)    GFR calc non Af Amer 32 (*)    GFR calc Af Amer 37 (*)    All other components within normal limits  CBC WITH DIFFERENTIAL/PLATELET - Abnormal; Notable for the following components:   WBC 12.1 (*)    Neutro Abs 8.5 (*)     Monocytes Absolute 1.3 (*)    All other components within normal limits  URINALYSIS, ROUTINE W REFLEX MICROSCOPIC - Abnormal; Notable for the following components:   APPearance HAZY (*)    Glucose, UA 50 (*)    Hgb urine dipstick SMALL (*)    Protein, ur >=300 (*)    Bacteria, UA RARE (*)    All other components within normal limits  LIPASE, BLOOD  TROPONIN I (HIGH SENSITIVITY)    EKG None  Radiology DG Chest 2 View  Result Date: 07/30/2019 CLINICAL DATA:  Midsternal chest pain radiating to back for several days, nausea and emesis EXAM: CHEST - 2 VIEW COMPARISON:  02/25/2019 FINDINGS: Frontal and lateral views of the chest demonstrate an unremarkable cardiac silhouette. No airspace disease, effusion, or pneumothorax. No acute bony abnormalities. IMPRESSION: 1. No acute intrathoracic process. Electronically Signed   By: Randa Ngo M.D.   On: 07/30/2019 16:50   US Abdomen Limited  Result Date: 07/31/2019 CLINICAL DATA:  RIGHT upper quadrant pain for 2 days, nausea, vomiting EXAM: ULTRASOUND ABDOMEN LIMITED RIGHT UPPER QUADRANT COMPARISON:  CT abdomen pelvis 09/29/2015 FINDINGS: Gallbladder: No normal appearing gallbladder identified. At expected position of gallbladder fossa, multiple echogenic foci are identified with posterior shadowing. Patient denies prior cholecystectomy, and a gallbladder was present on the prior CT exam from 2017. This likely represents a gallbladder filled with calculi creating a wall echo shadow complex. Sonographic P[place]ell  sign is present. Probable edema surrounding gallbladder. Unable to measure wall thickness. Findings are  highly suspicious for acute cholecystitis. Common bile duct: Diameter: 7 mm Liver: Heterogeneous increased echogenicity of the liver likely fatty infiltration of this can be seen with cirrhosis and certain infiltrative disorders. No focal hepatic mass or nodularity. Portal vein is patent on color Doppler imaging with normal direction of blood  flow towards the liver. Other: No RIGHT upper quadrant free fluid. IMPRESSION: Suspected gallbladder filled with calculi creating a wall echo shadow complex. Pericholecystic fluid and sonographic Murphy sign as well as potential mild wall thickening. Findings are highly suspicious for cholelithiasis and acute cholecystitis. Electronically Signed   By: Lavonia Dana M.D.   On: 07/31/2019 10:44    Procedures Procedures (including critical care time)  Medications Ordered in ED Medications  cefTRIAXone (ROCEPHIN) 2 g in sodium chloride 0.9 % 100 mL IVPB (2 g Intravenous New Bag/Given 07/31/19 1106)  ondansetron (ZOFRAN) injection 4 mg (4 mg Intravenous Given 07/31/19 1000)  morphine 4 MG/ML injection 4 mg (4 mg Intravenous Given 07/31/19 0958)  pantoprazole (PROTONIX) injection 40 mg (40 mg Intravenous Given 07/31/19 1001)    ED Course  I have reviewed the triage vital signs and the nursing notes.  Pertinent labs & imaging results that were available during my care of the patient were reviewed by me and considered in my medical decision making (see chart for details).    MDM Rules/Calculators/A&P                           This patient presents to the ED for concern of right upper quadrant and epigastric abdominal pain, this involves an extensive number of treatment options, and is a complaint that carries with it a high risk of complications and morbidity.  The differential diagnosis includes acute cholecystitis, pancreatitis, peptic ulcer disease, ruptured viscera, acute hepatitis, choledocholithiasis.  Less likely to be cardiac disease given recent work-up   Lab Tests:   I Ordered, reviewed, and interpreted labs, which included CBC, metabolic panel, liver function testing, lipase and a troponin  Medicines ordered:   I ordered medication morphine and Zofran for pain and nausea  Imaging Studies ordered:   I ordered imaging studies which included right upper quadrant ultrasound and  I  independently visualized and interpreted imaging which showed many gallstones causing some shadowing, some pericholecystic fluid and inflammation of the gallbladder wall consistent with acute cholecystitis  Additional history obtained:   Additional history obtained from the medical record  Previous records obtained and reviewed   Consultations Obtained:   I consulted general surgery with Dr. Constance Haw who recommends that the patient be admitted to the hospital on the hospitalist service and discussed lab and imaging findings.  We will consult with hospitalist for admission.   Discussed with Dr. Eligah East of hospitalist who will admit  Reevaluation:  After the interventions stated above, I reevaluated the patient and found the patient to have significant improvement however still tender in the right upper quadrant, antibiotics ordered including Rocephin and more pain medication.  Critical Interventions:  . [place] evaluation of the gallbladder . Lab work, antibiotics and IV fluids . Pain medication with opiate pain medication and antiemetics for nausea  Final Clinical Impression(s) / ED Diagnoses Final diagnoses:  Acute cholecystitis  Chronic renal impairment, unspecified CKD stage      Noemi Chapel, MD 07/31/19 1118

## 2019-07-31 NOTE — Op Note (Signed)
Operative Note   Preoperative Diagnosis: Acute cholecystitis, incarcerated umbilical hernia   Postoperative Diagnosis: Acute gangrenous cholecystitis, incarcerated umbilical hernia with omentum    Procedure(s) Performed: Laparoscopic cholecystectomy, primary umbilical hernia repair    Surgeon: Lanell Matar. Constance Haw, MD   Assistants: No qualified resident was available   Anesthesia: General endotracheal   Anesthesiologist: Denese Killings, MD    Specimens: Gallbladder    Estimated Blood Loss: Minimal    Blood Replacement: None    Complications: None    Operative Findings: Gangrenous cholecystitis, umbilical hernia with incarcerated omentum    Procedure: The patient was taken to the operating room and placed supine. General endotracheal anesthesia was induced. Intravenous antibiotics were administered per protocol. An orogastric tube positioned to decompress the stomach. The abdomen was prepared and draped in the usual sterile fashion.    A supraumbilical incision was made in the midline above his large umbilical hernia that was incarcerated. The incision was carried down to the fascia, and the fascia was entered with care due to the hernia. A Hassin technique was used to gain access into the peritoneal cavity.  Pneumoperitoneum to 15 mmHg with carbon dioxide was achieved. A 10 mm 0-degree operative laparoscope was introduced. The area underlying the trocar and Veress needle were inspected and without evidence of injury.  The hernia inferior had omentum which was what was suspected but no bowel.  Remaining trocars were placed under direct vision. Two 5 mm ports were placed in the right abdomen, between the anterior axillary and midclavicular line.  A final 11 mm port was placed through the mid-epigastrium, near the falciform ligament.   The gallbladder was distended and had signs of necrosis with purulent drainage and peritonitis of the abdominal cavity over the region. The gallbladder  was decompressed but mostly stones were evacuated and purulence.    The gallbladder fundus was elevated cephalad and the infundibulum was retracted to the patient's right. With great care, the gallbladder/cystic duct junction was skeletonized. The cystic artery noted in the triangle of Calot and was also skeletonized.  We then continued liberal medial and lateral dissection until the critical view of safety was achieved.   The cystic duct was too thick and friable to clip, so a vascular load was used through the 12 mm port site to take the cystic duct ensuring no other structures were transected.  The cystic artery was doubly clipped and divided. The gallbladder was then dissected from the liver bed with electrocautery. The back wall of the gallbladder was necrotic and some wall was left. This was cauterized to ensure no epithelium was left remaining. The specimen was placed in an Endopouch and all the stones that had spilled out were placed in the bag and the bag was retrieved through the epigastric site.   The trocars were placed, and the abdomen was irrigated and ensured that no further stones were noted.   Final inspection revealed acceptable hemostasis. [facility] [employer] SNOW X 2 were placed in the gallbladder bed.    I moved the camera to the 12 mm trocar, and using cautery carefully took down some of the omentum from the umbilical hernia.  A portion was still incarcerated, and could not be easily taken laparoscopically.   Trocars were removed and pneumoperitoneum was released.  0 Vicryl fascial sutures were used to close the epigastric port site.  Given the umbilical hernia, the skin incision was extended inferiorly, and the hernia sac and incarcerated omentum were dissected out down to the  fascia and transected off the umbilical stalk with cautery. Care was taken to ensure no bowel was in the specimen.  The fascia defect at the umbilicus was connected with the Hassin port site. Given the  contamination, I had told him I would not be able to use mesh and that the hernia may recur.  I closed the fascia without tension with interrupted 0 Ethibond suture. The umbilical stalk was tacked back to the fascia with a 3-0 Vicryl interrupted.  The incision was irrigated. Skin incisions were closed with staples given the infection. The incision was covered with sterile dressing.  The patient was awakened from anesthesia and extubated without complication.    Curlene Labrum, MD [facility] [address] [place], [place] [address] [phone] (office)

## 2019-08-01 LAB — COMPREHENSIVE METABOLIC PANEL
ALT: 60 U/L — ABNORMAL HIGH (ref 0–44)
AST: 81 U/L — ABNORMAL HIGH (ref 15–41)
Albumin: 3.4 g/dL — ABNORMAL LOW (ref 3.5–5.0)
Alkaline Phosphatase: 44 U/L (ref 38–126)
Anion gap: 15 (ref 5–15)
BUN: 34 mg/dL — ABNORMAL HIGH (ref 8–23)
CO2: 20 mmol/L — ABNORMAL LOW (ref 22–32)
Calcium: 8.1 mg/dL — ABNORMAL LOW (ref 8.9–10.3)
Chloride: 101 mmol/L (ref 98–111)
Creatinine, Ser: 3.75 mg/dL — ABNORMAL HIGH (ref 0.61–1.24)
GFR calc Af Amer: 18 mL/min — ABNORMAL LOW (ref >60)
GFR calc non Af Amer: 15 mL/min — ABNORMAL LOW (ref >60)
Glucose, Bld: 147 mg/dL — ABNORMAL HIGH (ref 70–99)
Potassium: 4.7 mmol/L (ref 3.5–5.1)
Sodium: 136 mmol/L (ref 135–145)
Total Bilirubin: 0.9 mg/dL (ref 0.3–1.2)
Total Protein: 6.8 g/dL (ref 6.5–8.1)

## 2019-08-01 LAB — CBC
HCT: 37.2 % — ABNORMAL LOW (ref 39.0–52.0)
Hemoglobin: 12 g/dL — ABNORMAL LOW (ref 13.0–17.0)
MCH: 29.1 pg (ref 26.0–34.0)
MCHC: 32.3 g/dL (ref 30.0–36.0)
MCV: 90.3 fL (ref 80.0–100.0)
Platelets: 207 10*3/uL (ref 150–400)
RBC: 4.12 MIL/uL — ABNORMAL LOW (ref 4.22–5.81)
RDW: 14.3 % (ref 11.5–15.5)
WBC: 9.7 10*3/uL (ref 4.0–10.5)
nRBC: 0 % (ref 0.0–0.2)

## 2019-08-01 LAB — GLUCOSE, CAPILLARY
Glucose-Capillary: 130 mg/dL — ABNORMAL HIGH (ref 70–99)
Glucose-Capillary: 134 mg/dL — ABNORMAL HIGH (ref 70–99)
Glucose-Capillary: 153 mg/dL — ABNORMAL HIGH (ref 70–99)
Glucose-Capillary: 157 mg/dL — ABNORMAL HIGH (ref 70–99)
Glucose-Capillary: 161 mg/dL — ABNORMAL HIGH (ref 70–99)

## 2019-08-01 LAB — PROTEIN / CREATININE RATIO, URINE
Creatinine, Urine: 289.71 mg/dL
Protein Creatinine Ratio: 0.52 mg/mg{Cre} — ABNORMAL HIGH (ref 0.00–0.15)
Total Protein, Urine: 150 mg/dL

## 2019-08-01 LAB — MAGNESIUM: Magnesium: 1.8 mg/dL (ref 1.7–2.4)

## 2019-08-01 LAB — SODIUM, URINE, RANDOM: Sodium, Ur: 28 mmol/L

## 2019-08-01 LAB — CREATININE, URINE, RANDOM: Creatinine, Urine: 296.78 mg/dL

## 2019-08-01 MED ORDER — IPRATROPIUM-ALBUTEROL 0.5-2.5 (3) MG/3ML IN SOLN
3.0000 mL | RESPIRATORY_TRACT | Status: DC | PRN
  Administered 2019-08-01 (×2): 3 mL via RESPIRATORY_TRACT
  Filled 2019-08-01 (×2): qty 3

## 2019-08-01 MED ORDER — GUAIFENESIN ER 600 MG PO TB12
1200.0000 mg | ORAL_TABLET | Freq: Two times a day (BID) | ORAL | Status: DC
  Administered 2019-08-01 – 2019-08-06 (×11): 1200 mg via ORAL
  Filled 2019-08-01 (×11): qty 2

## 2019-08-01 MED ORDER — LACTATED RINGERS IV SOLN: INTRAVENOUS | Status: DC

## 2019-08-01 MED ORDER — HYDROMORPHONE HCL 1 MG/ML IJ SOLN
0.5000 mg | INTRAMUSCULAR | Status: DC | PRN
  Administered 2019-08-01 – 2019-08-02 (×5): 0.5 mg via INTRAVENOUS
  Filled 2019-08-01 (×6): qty 0.5

## 2019-08-01 MED ORDER — ALBUTEROL SULFATE (2.5 MG/3ML) 0.083% IN NEBU
INHALATION_SOLUTION | RESPIRATORY_TRACT | Status: AC
  Administered 2019-08-01: 2.5 mg
  Filled 2019-08-01: qty 3

## 2019-08-01 MED ORDER — IPRATROPIUM BROMIDE 0.02 % IN SOLN
RESPIRATORY_TRACT | Status: AC
  Administered 2019-08-01: 0.5 mg
  Filled 2019-08-01: qty 2.5

## 2019-08-01 NOTE — Consult Note (Addendum)
[facility]  INPATIENT CONSULTATION  Reason for Consultation: AKI Requesting Provider: Dr. Eligah East  HPI: Gabriel Schultz is an 69 y.o. male with CAD (MI 2003), HTN, HL, gout, DM type 2, GERD who is seen for evaluation and management of AKI.   Started feeling bad Mon with abdominal and chest pain, saw outpt provider; was taking ibuprofen 800 BID.  Presented to [facility] Thurs 07/30/19 with epigastric and CP -> MI r/o and discharged, returned 6/11 with RUQ pain and dry heaving. Ultimately dx with cholecystitis and underwent lap chole 6/11 --> dx acute gangrenous cholecystitis with incarcerated umbilical hernia repair.  No contrast.  Receiving CTX and had pre op cefotetan.  During surgery looks like BPs into 90/50s transiently and start and finish of surgery.  Lowest BP post op 119/66.  Tm 100.7 this AM.  Requiring 3L post op and this AM 92% on 5L presumed due to atelectasis.   I/Os since admission yesterday 3.2 / 0.35 (+2.8L).   On LR 75/hr currently. Cr baseline appears to be in the 1.7 range, on presentation 6/10 was 1.9 > 6/11 2.05 > 6/12 3.75mg /dL.   K today 4.7, bicarb 20, Hb 13.4 preop, now 12.  A1c 7.4.  UA 1.022, 3+ protein, small blood, no cells or casts noted on  Microscopy.    He has at times weak stream, urgency, nocturia up to 5-6x some nights but no straining to void.    Home meds include allopurinol 100 QOD, xanax, amlodipine 10, glipizide 5, metformin 1g BID, toprol 50 daily, naproxyn, protoxin, pravastatin  Last TTE was in 2018, normal.   PMH: Past Medical History:  Diagnosis Date  . Arthritis   . Coronary artery disease   . Diabetes mellitus without complication ([place])   . GERD (gastroesophageal reflux disease)   . Gout   . High cholesterol   . Hypertension   . Myocardial infarction [facility])    2003   PSH: Past Surgical History:  Procedure Laterality Date  . BIOPSY  05/21/2019   Procedure: BIOPSY;  Surgeon: Daneil Dolin, MD;  [facility];  Service:  Endoscopy;;  . COLONOSCOPY N/A 11/04/2018   Procedure: COLONOSCOPY;  Surgeon: Daneil Dolin, MD;  [facility];  Service: Endoscopy;  Laterality: N/A;  1:00  . CORONARY STENT PLACEMENT    . ESOPHAGOGASTRODUODENOSCOPY (EGD) WITH PROPOFOL N/A 05/21/2019   Procedure: ESOPHAGOGASTRODUODENOSCOPY (EGD) WITH PROPOFOL;  Surgeon: Daneil Dolin, MD;  [facility];  Service: Endoscopy;  Laterality: N/A;  9:15am  . HERNIA REPAIR Left    inguinal  . MALONEY DILATION N/A 05/21/2019   Procedure: Venia Minks DILATION;  Surgeon: Daneil Dolin, MD;  [facility];  Service: Endoscopy;  Laterality: N/A;  . POLYPECTOMY  11/04/2018   Procedure: POLYPECTOMY;  Surgeon: Daneil Dolin, MD;  [facility];  Service: Endoscopy;;    Past Medical History:  Diagnosis Date  . Arthritis   . Coronary artery disease   . Diabetes mellitus without complication ([place])   . GERD (gastroesophageal reflux disease)   . Gout   . High cholesterol   . Hypertension   . Myocardial infarction [facility])    2003    Medications:  I have reviewed the patient's current medications.  Medications Prior to Admission  Medication Sig Dispense Refill  . allopurinol (ZYLOPRIM) 100 MG tablet Take 100 mg by mouth every other day. In the evening    . ALPRAZolam (XANAX) 0.5 MG tablet  Take 0.25-0.5 mg by mouth 3 (three) times daily as needed for anxiety.     [name] amLODipine (NORVASC) 10 MG tablet Take 10 mg by mouth every evening.     [name] aspirin EC 81 MG tablet Take 81 mg by mouth every evening.     . fluticasone (FLONASE) 50 MCG/ACT nasal spray Place 1-2 sprays into both nostrils 2 (two) times daily as needed for allergies or rhinitis.     [name] glipiZIDE (GLUCOTROL XL) 5 MG 24 hr tablet Take 5 mg by mouth every morning.    . metFORMIN (GLUCOPHAGE) 1000 MG tablet Take 1,000 mg by mouth 2 (two) times daily.    . methocarbamol (ROBAXIN) 750 MG tablet Take 750 mg by mouth 2 (two) times daily as needed for muscle  spasms (back pain).    . metoprolol succinate (TOPROL-XL) 50 MG 24 hr tablet Take 50 mg by mouth daily.     [name] MUCINEX MAXIMUM STRENGTH 1200 MG TB12 Take 1,200 mg by mouth daily.     . naproxen (NAPROSYN) 500 MG tablet Take 500 mg by mouth 2 (two) times daily as needed (back pain/neck stiffness).     . pantoprazole (PROTONIX) 40 MG tablet Take 40 mg by mouth 2 (two) times daily.    . pravastatin (PRAVACHOL) 20 MG tablet Take 20 mg by mouth every evening.     . sodium chloride (OCEAN) 0.65 % SOLN nasal spray Place 2 sprays into both nostrils 3 (three) times daily as needed for congestion.      ALLERGIES:  No Known Allergies  FAM HX: Family History  Problem Relation Age of Onset  . Diabetes Mother   . Diabetes Father     Social History:   reports that he quit smoking about 31 years ago. His smoking use included cigarettes. He has a 30.00 pack-year smoking history. He quit smokeless tobacco use about 3 years ago.  His smokeless tobacco use included chew. He reports previous alcohol use. He reports that he does not use drugs.  ROS: 12 system ROS per HPI above  Blood pressure 123/78, pulse 98, temperature (!) 100.7 F (38.2 C), temperature source Oral, resp. rate 20, height 6\' 1"  (1.854 m), weight 115 kg, SpO2 92 %. PHYSICAL EXAM: Gen: tired but nontoxic appearing  Eyes: anicteric ENT: MMM Neck: supple, JVD to 6cm at 30 degrees CV: RRR, no rub Abd: abd binder in place Lungs: working with RT for albuterol/pulmonary toilet, clear to bases GU: no foley Extr:  No edema Neuro: no asterixis Skin: tanned exposed areas, no rashes   Results for orders placed or performed during the hospital encounter of 07/31/19 (from the past 48 hour(s))  Comprehensive metabolic panel     Status: Abnormal   Collection Time: 07/31/19  9:50 AM  Result Value Ref Range   Sodium 135 135 - 145 mmol/L   Potassium 4.4 3.5 - 5.1 mmol/L   Chloride 99 98 - 111 mmol/L   CO2 21 (L) 22 - 32 mmol/L   Glucose, Bld  210 (H) 70 - 99 mg/dL    Comment: Glucose reference range applies only to samples taken after fasting for at least 8 hours.   BUN 19 8 - 23 mg/dL   Creatinine, Ser 2.05 (H) 0.61 - 1.24 mg/dL   Calcium 9.0 8.9 - 10.3 mg/dL   Total Protein 7.7 6.5 - 8.1 g/dL   Albumin 3.7 3.5 - 5.0 g/dL   AST 14 (L) 15 - 41 U/L   ALT 16  0 - 44 U/L   Alkaline Phosphatase 52 38 - 126 U/L   Total Bilirubin 1.4 (H) 0.3 - 1.2 mg/dL   GFR calc non Af Amer 32 (L) >60 mL/min   GFR calc Af Amer 37 (L) >60 mL/min   Anion gap 15 5 - 15    Comment: Performed at [facility], [address], [place], [place] [address]  CBC with Differential/Platelet     Status: Abnormal   Collection Time: 07/31/19  9:50 AM  Result Value Ref Range   WBC 12.1 (H) 4.0 - 10.5 K/uL   RBC 4.60 4.22 - 5.81 MIL/uL   Hemoglobin 13.5 13.0 - 17.0 g/dL   HCT 41.1 39 - 52 %   MCV 89.3 80.0 - 100.0 fL   MCH 29.3 26.0 - 34.0 pg   MCHC 32.8 30.0 - 36.0 g/dL   RDW 14.0 11.5 - 15.5 %   Platelets 235 150 - 400 K/uL   nRBC 0.0 0.0 - 0.2 %   Neutrophils Relative % 70 %   Neutro Abs 8.5 (H) 1.7 - 7.7 K/uL   Lymphocytes Relative 19 %   Lymphs Abs 2.3 0.7 - 4.0 K/uL   Monocytes Relative 11 %   Monocytes Absolute 1.3 (H) 0 - 1 K/uL   Eosinophils Relative 0 %   Eosinophils Absolute 0.0 0 - 0 K/uL   Basophils Relative 0 %   Basophils Absolute 0.0 0 - 0 K/uL   Immature Granulocytes 0 %   Abs Immature Granulocytes 0.03 0.00 - 0.07 K/uL    Comment: Performed at [facility], [address], [place], [place] [address]  Lipase, blood     Status: None   Collection Time: 07/31/19  9:50 AM  Result Value Ref Range   Lipase 20 11 - 51 U/L    Comment: Performed at [facility], [address] [place], [place] [address]  Troponin I (High Sensitivity)     Status: None   Collection Time: 07/31/19  9:50 AM  Result Value Ref Range   Troponin I (High Sensitivity) 6 <18 ng/L    Comment: (NOTE) Elevated high sensitivity troponin I (hsTnI) values and significant   changes across serial measurements may suggest ACS but many other  chronic and acute conditions are known to elevate hsTnI results.  Refer to the "Links" section for chest pain algorithms and additional  guidance. Performed at [facility], [address] [place], [place] [address]   Urinalysis, Routine w reflex microscopic     Status: Abnormal   Collection Time: 07/31/19 10:16 AM  Result Value Ref Range   Color, Urine YELLOW YELLOW   APPearance HAZY (A) CLEAR   Specific Gravity, Urine 1.022 1.005 - 1.030   pH 5.0 5.0 - 8.0   Glucose, UA 50 (A) NEGATIVE mg/dL   Hgb urine dipstick SMALL (A) NEGATIVE   Bilirubin Urine NEGATIVE NEGATIVE   Ketones, ur NEGATIVE NEGATIVE mg/dL   Protein, ur >=300 (A) NEGATIVE mg/dL   Nitrite NEGATIVE NEGATIVE   Leukocytes,Ua NEGATIVE NEGATIVE   RBC / HPF 0-5 0 - 5 RBC/hpf   WBC, UA 0-5 0 - 5 WBC/hpf   Bacteria, UA RARE (A) NONE SEEN   Squamous Epithelial / LPF 0-5 0 - 5   Mucus PRESENT     Comment: Performed at [facility], [address] [place], [place] [address]  SARS Coronavirus 2 by RT PCR (hospital order, performed in [place] hospital lab) Nasopharyngeal Nasopharyngeal Swab     Status:  None   Collection Time: 07/31/19 12:09 PM   Specimen: Nasopharyngeal Swab  Result Value Ref Range   SARS Coronavirus 2 NEGATIVE NEGATIVE    Comment: (NOTE) SARS-CoV-2 target nucleic acids are NOT DETECTED.  The SARS-CoV-2 RNA is generally detectable in upper and lower respiratory specimens during the acute phase of infection. The lowest concentration of SARS-CoV-2 viral copies this assay can detect is 250 copies / mL. A negative result does not preclude SARS-CoV-2 infection and should not be used as the sole basis for treatment or other patient management decisions.  A negative result may occur with improper specimen collection / handling, submission of specimen other than nasopharyngeal swab, presence of viral mutation(s) within the areas targeted by  this assay, and inadequate number of viral copies (<250 copies / mL). A negative result must be combined with clinical observations, patient history, and epidemiological information.  Fact Sheet for Patients:   StrictlyIdeas.no  Fact Sheet for Healthcare Providers: BankingDealers.co.za  This test is not yet approved or  cleared by the [place] FDA and has been authorized for detection and/or diagnosis of SARS-CoV-2 by FDA under an Emergency Use Authorization (EUA).  This EUA will remain in effect (meaning this test can be used) for the duration of the COVID-19 declaration under Section 564(b)(1) of the Act, 21 U.S.C. section 360bbb-3(b)(1), unless the authorization is terminated or revoked sooner.  Performed at [facility], [address], [place], [place] [address]   Hemoglobin A1c     Status: Abnormal   Collection Time: 07/31/19  1:01 PM  Result Value Ref Range   Hgb A1c MFr Bld 7.4 (H) 4.8 - 5.6 %    Comment: (NOTE) Pre diabetes:          5.7%-6.4%  Diabetes:              >6.4%  Glycemic control for   <7.0% adults with diabetes    Mean Plasma Glucose 165.68 mg/dL    Comment: Performed at [facility] [address] [place], [place] [address]  HIV Antibody (routine testing w rflx)     Status: None   Collection Time: 07/31/19  1:01 PM  Result Value Ref Range   HIV Screen 4th Generation wRfx Non Reactive Non Reactive    Comment: Performed at [facility], [place] [address], [place], [place] [address]  CBG monitoring, ED     Status: Abnormal   Collection Time: 07/31/19  1:26 PM  Result Value Ref Range   Glucose-Capillary 182 (H) 70 - 99 mg/dL    Comment: Glucose reference range applies only to samples taken after fasting for at least 8 hours.  Glucose, capillary     Status: Abnormal   Collection Time: 07/31/19  6:24 PM  Result Value Ref Range   Glucose-Capillary 195 (H) 70 - 99 mg/dL    Comment: Glucose  reference range applies only to samples taken after fasting for at least 8 hours.  Glucose, capillary     Status: Abnormal   Collection Time: 07/31/19  8:10 PM  Result Value Ref Range   Glucose-Capillary 224 (H) 70 - 99 mg/dL    Comment: Glucose reference range applies only to samples taken after fasting for at least 8 hours.  Glucose, capillary     Status: Abnormal   Collection Time: 08/01/19 12:29 AM  Result Value Ref Range   Glucose-Capillary 130 (H) 70 - 99 mg/dL    Comment: Glucose reference range applies only to samples taken  after fasting for at least 8 hours.  Glucose, capillary     Status: Abnormal   Collection Time: 08/01/19  4:40 AM  Result Value Ref Range   Glucose-Capillary 153 (H) 70 - 99 mg/dL    Comment: Glucose reference range applies only to samples taken after fasting for at least 8 hours.  Magnesium     Status: None   Collection Time: 08/01/19  6:43 AM  Result Value Ref Range   Magnesium 1.8 1.7 - 2.4 mg/dL    Comment: Performed at [facility], [address], [place], [place] [address]  Comprehensive metabolic panel     Status: Abnormal   Collection Time: 08/01/19  6:43 AM  Result Value Ref Range   Sodium 136 135 - 145 mmol/L   Potassium 4.7 3.5 - 5.1 mmol/L   Chloride 101 98 - 111 mmol/L   CO2 20 (L) 22 - 32 mmol/L   Glucose, Bld 147 (H) 70 - 99 mg/dL    Comment: Glucose reference range applies only to samples taken after fasting for at least 8 hours.   BUN 34 (H) 8 - 23 mg/dL   Creatinine, Ser 3.75 (H) 0.61 - 1.24 mg/dL    Comment: DELTA CHECK NOTED   Calcium 8.1 (L) 8.9 - 10.3 mg/dL   Total Protein 6.8 6.5 - 8.1 g/dL   Albumin 3.4 (L) 3.5 - 5.0 g/dL   AST 81 (H) 15 - 41 U/L   ALT 60 (H) 0 - 44 U/L   Alkaline Phosphatase 44 38 - 126 U/L   Total Bilirubin 0.9 0.3 - 1.2 mg/dL   GFR calc non Af Amer 15 (L) >60 mL/min   GFR calc Af Amer 18 (L) >60 mL/min   Anion gap 15 5 - 15    Comment: Performed at [facility], [address] [place], [place] [address]   CBC     Status: Abnormal   Collection Time: 08/01/19  6:43 AM  Result Value Ref Range   WBC 9.7 4.0 - 10.5 K/uL   RBC 4.12 (L) 4.22 - 5.81 MIL/uL   Hemoglobin 12.0 (L) 13.0 - 17.0 g/dL   HCT 37.2 (L) 39 - 52 %   MCV 90.3 80.0 - 100.0 fL   MCH 29.1 26.0 - 34.0 pg   MCHC 32.3 30.0 - 36.0 g/dL   RDW 14.3 11.5 - 15.5 %   Platelets 207 150 - 400 K/uL   nRBC 0.0 0.0 - 0.2 %    Comment: Performed at [facility], [address], [place], [place] [address]  Glucose, capillary     Status: Abnormal   Collection Time: 08/01/19  7:55 AM  Result Value Ref Range   Glucose-Capillary 134 (H) 70 - 99 mg/dL    Comment: Glucose reference range applies only to samples taken after fasting for at least 8 hours.  Glucose, capillary     Status: Abnormal   Collection Time: 08/01/19 11:31 AM  Result Value Ref Range   Glucose-Capillary 161 (H) 70 - 99 mg/dL    Comment: Glucose reference range applies only to samples taken after fasting for at least 8 hours.    DG Chest 2 View  Result Date: 07/30/2019 CLINICAL DATA:  Midsternal chest pain radiating to back for several days, nausea and emesis EXAM: CHEST - 2 VIEW COMPARISON:  02/25/2019 FINDINGS: Frontal and lateral views of the chest demonstrate an unremarkable cardiac silhouette. No airspace disease, effusion, or pneumothorax. No acute bony abnormalities. IMPRESSION: 1. No acute intrathoracic  process. Electronically Signed   By: Randa Ngo M.D.   On: 07/30/2019 16:50   US Abdomen Limited  Result Date: 07/31/2019 CLINICAL DATA:  RIGHT upper quadrant pain for 2 days, nausea, vomiting EXAM: ULTRASOUND ABDOMEN LIMITED RIGHT UPPER QUADRANT COMPARISON:  CT abdomen pelvis 09/29/2015 FINDINGS: Gallbladder: No normal appearing gallbladder identified. At expected position of gallbladder fossa, multiple echogenic foci are identified with posterior shadowing. Patient denies prior cholecystectomy, and a gallbladder was present on the prior CT exam from 2017. This  likely represents a gallbladder filled with calculi creating a wall echo shadow complex. Sonographic Percell Miller sign is present. Probable edema surrounding gallbladder. Unable to measure wall thickness. Findings are highly suspicious for acute cholecystitis. Common bile duct: Diameter: 7 mm Liver: Heterogeneous increased echogenicity of the liver likely fatty infiltration of this can be seen with cirrhosis and certain infiltrative disorders. No focal hepatic mass or nodularity. Portal vein is patent on color Doppler imaging with normal direction of blood flow towards the liver. Other: No RIGHT upper quadrant free fluid. IMPRESSION: Suspected gallbladder filled with calculi creating a wall echo shadow complex. Pericholecystic fluid and sonographic Murphy sign as well as potential mild wall thickening. Findings are highly suspicious for cholelithiasis and acute cholecystitis. Electronically Signed   By: Lavonia Dana M.D.   On: 07/31/2019 10:44   DG Chest Port 1 View  Result Date: 07/31/2019 CLINICAL DATA:  Shortness of breath nausea. EXAM: PORTABLE CHEST 1 VIEW COMPARISON:  07/30/2019 FINDINGS: Artifact overlies the chest. Poor inspiration. Heart size is normal. Chronic interstitial lung markings, probably slightly more prominent because of the poor inspiration. Evidence of consolidation or lobar collapse. No effusion. IMPRESSION: Poor inspiration. Chronic interstitial lung markings which appear slightly more prominent, probably due to the inspiration. No focal consolidation or collapse. Electronically Signed   By: Nelson Chimes M.D.   On: 07/31/2019 11:52    Assessment/Plan **AKI, oliguric on CKD3:  Suspect this is hypovolemia + ischemic ATN from transient hypotension during surgery + sepsis on underlying mild CKD (BL ~1.7 though just 1 value in epic for comparison recently; ?arterionephrosclerosis +/- DM).  No contrast exposure.  Renal US and urine electrolytes are pending.  Check UP/C; noted small blood on UA but  no blood on microscopy, check CK.   Ok to continue LR to maintain euvolemia.  Avoid nephrotoxins and hypotension.  Monitor for improvement with supportive care, hopefully will not progress to need for RRT.    **Acute hypoxic respiratory failure:  Presumed due to post op atelectasis.  Plans for CXR if not improving tomorrow.  Low threshold to D/C fluids but no rales or edema on exam and po intake is still poor.   **HTN: normotensive off meds, monitor for now.   **DM:  Well controlled, hold metformin in light of AKI, per primary.   **h/o CAD  **gout: on allopurinol  **GERD: IV PPI fine, not etiology of AKI  Justin Mend 08/01/2019, 2:04 PM

## 2019-08-01 NOTE — Progress Notes (Signed)
PROGRESS NOTE    ILIAN Schultz  EHM:[id number] DOB: 1950-06-06 DOA: 07/31/2019 PCP: Cory Munch, PA-C   Brief Narrative:  Per HPI: Gabriel Schultz is a 69 y.o. male with medical history significant for CAD with prior MI in 2003, prior tobacco abuse, hypertension, dyslipidemia, chronic gout, type 2 diabetes, and GERD who presented to the ED initially on 6/10 with complaints of some epigastric and chest pain.  He was noted to have some radiation of pain to the center of his back and had some nausea and vomiting.  He was sent home after he was ruled out for acute coronary syndrome.  He presented back today after he woke up with severe right upper quadrant abdominal pain with some dry heaving.  There appears to be no exacerbating or alleviating factors and no radiation of the pain.  He denies any fevers, chills, diarrhea, constipation, shortness of breath, or chest pain.  -Patient was admitted with acute cholecystitis and is status post laparoscopic cholecystectomy with removal of gangrenous gallbladder as well as umbilical hernia repair on 6/11.    Assessment & Plan:   Principal Problem:   Acute gangrenous cholecystitis Active Problems:   Umbilical hernia without obstruction and without gangrene   Acute gangrenous cholecystitis with incarcerated umbilical hernia -Status post laparoscopic cholecystectomy and primary umbilical hernia repair on 6/11 -Started on clear liquid diet and therefore will discontinue IV fluid-appears to be tolerating.  Advance per general surgery -Continue daily IV Rocephin for now -Pain management with IV Dilaudid as needed -IV Zofran as needed  Acute hypoxemic respiratory failure -Suspect to be related to postoperative atelectasis -Discussed with RN the need to ambulate patient and use incentive spirometry -Up in chair -Patient noted to be fluid positive  -Plan to repeat chest x-ray by 6/13 if oxygen is not being weaned adequately.  Currently does not  appear volume overloaded.  AKI on CKD stage IIIb -Urine output only recorded as 275 mL and therefore likely has some component of oliguria -Creatinine noted to be 3.75 today with baseline progression over time, noted to be near 1.7-1.8 -Continue monitor strict I's and O's and daily weights -Continue IV fluids -Plan to check urine lites as well as renal ultrasound -Plan to consult with nephrology today -Repeat a.m. labs and avoid nephrotoxic agents  History of CAD with prior MI in 2003/hypertension/dyslipidemia -Plan to hold blood pressure medications and statin for now  History of chronic gout -Resume home allopurinol  History of type 2 diabetes with noted hyperglycemia-improved -Placed on SSI with Accu-Cheks every 4 hours -Hold home Metformin and glipizide until further dietary advancement -Hemoglobin A1c 7.4%  History of GERD -IV PPI daily  Obesity -BMI 33.45 kg/m -Recommend lifestyle changes   DVT prophylaxis: Heparin Code Status: Full code Family Communication: None currently at bedside Disposition Plan: Continue on IV antibiotics and advance diet per general surgery.  Wean oxygen as tolerated. Status is: Inpatient  Remains inpatient appropriate because:IV treatments appropriate due to intensity of illness or inability to take PO and Inpatient level of care appropriate due to severity of illness   Dispo: The patient is from: Home              Anticipated d/c is to: Home              Anticipated d/c date is: 2 days              Patient currently is not medically stable to d/c.  Consultants:   General  surgery-Dr. Constance Haw  Procedures:   Status post laparoscopic cholecystectomy as well as umbilical hernia repair 4/13  Antimicrobials:  Anti-infectives (From admission, onward)   Start     Dose/Rate Route Frequency Ordered Stop   08/01/19 1100  cefTRIAXone (ROCEPHIN) 2 g in sodium chloride 0.9 % 100 mL IVPB  Status:  Discontinued        2 g 200 mL/hr over  30 Minutes Intravenous Every 24 hours 07/31/19 1251 07/31/19 1831   08/01/19 1100  cefTRIAXone (ROCEPHIN) 2 g in sodium chloride 0.9 % 100 mL IVPB     Discontinue     2 g 200 mL/hr over 30 Minutes Intravenous Every 24 hours 07/31/19 1831 08/03/19 1059   08/01/19 0600  cefoTEtan (CEFOTAN) 2 g in sodium chloride 0.9 % 100 mL IVPB        2 g 200 mL/hr over 30 Minutes Intravenous On call to O.R. 07/31/19 1316 08/01/19 0916   07/31/19 1100  cefTRIAXone (ROCEPHIN) 2 g in sodium chloride 0.9 % 100 mL IVPB        2 g 200 mL/hr over 30 Minutes Intravenous  Once 07/31/19 1057 07/31/19 1153       Subjective: Patient seen and evaluated today he is noted to have higher oxygen requirements today and is currently on 5 L nasal cannula and is having some chest congestion.  No flatus or BM noted.  Appears to be tolerating clear liquids.  Objective: Vitals:   08/01/19 0437 08/01/19 0800 08/01/19 0823 08/01/19 1200  BP: (!) 141/82 123/78    Pulse: 95 98    Resp: (!) 24 20    Temp: 99.5 F (37.5 C) (!) 100.7 F (38.2 C)    TempSrc: Oral Oral    SpO2: 93% 94% (!) 87% 92%  Weight:      Height:        Intake/Output Summary (Last 24 hours) at 08/01/2019 1203 Last data filed at 08/01/2019 0800 Gross per 24 hour  Intake 2816.52 ml  Output 275 ml  Net 2541.52 ml   Filed Weights   07/31/19 0942  Weight: 115 kg    Examination:  General exam: Appears calm and comfortable  Respiratory system: Clear to auscultation. Respiratory effort normal.  Currently on 5 L nasal cannula oxygen. Cardiovascular system: S1 & S2 heard, RRR. No JVD, murmurs, rubs, gallops or clicks. No pedal edema. Gastrointestinal system: Abdomen is distended with binder present.  Incisions clean dry and intact. Central nervous system: Alert and oriented. No focal neurological deficits. Extremities: Symmetric 5 x 5 power. Skin: No rashes, lesions or ulcers Psychiatry: Judgement and insight appear normal. Mood & affect appropriate.       Data Reviewed: I have personally reviewed following labs and imaging studies  CBC: Recent Labs  Lab 07/30/19 1535 07/31/19 0950 08/01/19 0643  WBC 9.8 12.1* 9.7  NEUTROABS  --  8.5*  --   HGB 13.4 13.5 12.0*  HCT 40.2 41.1 37.2*  MCV 88.2 89.3 90.3  PLT 194 235 244   Basic Metabolic Panel: Recent Labs  Lab 07/30/19 1535 07/31/19 0950 08/01/19 0643  NA 133* 135 136  K 4.3 4.4 4.7  CL 102 99 101  CO2 21* 21* 20*  GLUCOSE 227* 210* 147*  BUN 19 19 34*  CREATININE 1.86* 2.05* 3.75*  CALCIUM 8.9 9.0 8.1*  MG  --   --  1.8   GFR: Estimated Creatinine Clearance: 24.7 mL/min (A) (by C-G formula based on SCr of 3.75 mg/dL (H)).  Liver Function Tests: Recent Labs  Lab 07/30/19 1535 07/31/19 0950 08/01/19 0643  AST 19 14* 81*  ALT 17 16 60*  ALKPHOS 52 52 44  BILITOT 1.0 1.4* 0.9  PROT 7.5 7.7 6.8  ALBUMIN 3.8 3.7 3.4*   Recent Labs  Lab 07/30/19 1535 07/31/19 0950  LIPASE 25 20   No results for input(s): AMMONIA in the last 168 hours. Coagulation Profile: No results for input(s): INR, PROTIME in the last 168 hours. Cardiac Enzymes: No results for input(s): CKTOTAL, CKMB, CKMBINDEX, TROPONINI in the last 168 hours. BNP (last 3 results) No results for input(s): PROBNP in the last 8760 hours. HbA1C: Recent Labs    07/31/19 1301  HGBA1C 7.4*   CBG: Recent Labs  Lab 07/31/19 2010 08/01/19 0029 08/01/19 0440 08/01/19 0755 08/01/19 1131  GLUCAP 224* 130* 153* 134* 161*   Lipid Profile: No results for input(s): CHOL, HDL, LDLCALC, TRIG, CHOLHDL, LDLDIRECT in the last 72 hours. Thyroid Function Tests: No results for input(s): TSH, T4TOTAL, FREET4, T3FREE, THYROIDAB in the last 72 hours. Anemia Panel: No results for input(s): VITAMINB12, FOLATE, FERRITIN, TIBC, IRON, RETICCTPCT in the last 72 hours. Sepsis Labs: No results for input(s): PROCALCITON, LATICACIDVEN in the last 168 hours.  Recent Results (from the past 240 hour(s))  SARS Coronavirus  2 by RT PCR (hospital order, performed in [facility] hospital lab) Nasopharyngeal Nasopharyngeal Swab     Status: None   Collection Time: 07/31/19 12:09 PM   Specimen: Nasopharyngeal Swab  Result Value Ref Range Status   SARS Coronavirus 2 NEGATIVE NEGATIVE Final    Comment: (NOTE) SARS-CoV-2 target nucleic acids are NOT DETECTED.  The SARS-CoV-2 RNA is generally detectable in upper and lower respiratory specimens during the acute phase of infection. The lowest concentration of SARS-CoV-2 viral copies this assay can detect is 250 copies / mL. A negative result does not preclude SARS-CoV-2 infection and should not be used as the sole basis for treatment or other patient management decisions.  A negative result may occur with improper specimen collection / handling, submission of specimen other than nasopharyngeal swab, presence of viral mutation(s) within the areas targeted by this assay, and inadequate number of viral copies (<250 copies / mL). A negative result must be combined with clinical observations, patient history, and epidemiological information.  Fact Sheet for Patients:   StrictlyIdeas.no  Fact Sheet for Healthcare Providers: BankingDealers.co.za  This test is not yet approved or  cleared by the [place] FDA and has been authorized for detection and/or diagnosis of SARS-CoV-2 by FDA under an Emergency Use Authorization (EUA).  This EUA will remain in effect (meaning this test can be used) for the duration of the COVID-19 declaration under Section 564(b)(1) of the Act, 21 U.S.C. section 360bbb-3(b)(1), unless the authorization is terminated or revoked sooner.  Performed at [facility], [address], [place], [place] [address]          Radiology Studies: DG Chest 2 View  Result Date: 07/30/2019 CLINICAL DATA:  Midsternal chest pain radiating to back for several days, nausea and emesis EXAM: CHEST - 2 VIEW  COMPARISON:  02/25/2019 FINDINGS: Frontal and lateral views of the chest demonstrate an unremarkable cardiac silhouette. No airspace disease, effusion, or pneumothorax. No acute bony abnormalities. IMPRESSION: 1. No acute intrathoracic process. Electronically Signed   By: Randa Ngo M.D.   On: 07/30/2019 16:50   US Abdomen Limited  Result Date: 07/31/2019 CLINICAL DATA:  RIGHT upper quadrant pain for 2 days, nausea,  vomiting EXAM: ULTRASOUND ABDOMEN LIMITED RIGHT UPPER QUADRANT COMPARISON:  CT abdomen pelvis 09/29/2015 FINDINGS: Gallbladder: No normal appearing gallbladder identified. At expected position of gallbladder fossa, multiple echogenic foci are identified with posterior shadowing. Patient denies prior cholecystectomy, and a gallbladder was present on the prior CT exam from 2017. This likely represents a gallbladder filled with calculi creating a wall echo shadow complex. Sonographic Percell Miller sign is present. Probable edema surrounding gallbladder. Unable to measure wall thickness. Findings are highly suspicious for acute cholecystitis. Common bile duct: Diameter: 7 mm Liver: Heterogeneous increased echogenicity of the liver likely fatty infiltration of this can be seen with cirrhosis and certain infiltrative disorders. No focal hepatic mass or nodularity. Portal vein is patent on color Doppler imaging with normal direction of blood flow towards the liver. Other: No RIGHT upper quadrant free fluid. IMPRESSION: Suspected gallbladder filled with calculi creating a wall echo shadow complex. Pericholecystic fluid and sonographic Murphy sign as well as potential mild wall thickening. Findings are highly suspicious for cholelithiasis and acute cholecystitis. Electronically Signed   By: Lavonia Dana M.D.   On: 07/31/2019 10:44   DG Chest Port 1 View  Result Date: 07/31/2019 CLINICAL DATA:  Shortness of breath nausea. EXAM: PORTABLE CHEST 1 VIEW COMPARISON:  07/30/2019 FINDINGS: Artifact overlies the  chest. Poor inspiration. Heart size is normal. Chronic interstitial lung markings, probably slightly more prominent because of the poor inspiration. Evidence of consolidation or lobar collapse. No effusion. IMPRESSION: Poor inspiration. Chronic interstitial lung markings which appear slightly more prominent, probably due to the inspiration. No focal consolidation or collapse. Electronically Signed   By: Nelson Chimes M.D.   On: 07/31/2019 11:52        Scheduled Meds:  guaiFENesin  1,200 mg Oral BID   heparin  5,000 Units Subcutaneous Q8H   insulin aspart  0-15 Units Subcutaneous Q4H   pantoprazole (PROTONIX) IV  40 mg Intravenous Q24H   Continuous Infusions:  cefTRIAXone (ROCEPHIN)  IV 2 g (08/01/19 1149)     LOS: 1 day    Time spent: 35 minutes    Yuette Putnam Darleen Crocker, DO [facility]  If 7PM-7AM, please contact night-coverage [email or web address] 08/01/2019, 12:03 PM

## 2019-08-01 NOTE — Progress Notes (Signed)
[facility]  Patient s/p lap cholecystectomy and primary umbilical hernia repair. Doing fair but having some fevers post op and requiring some O2. Cr trending up from AKI.  BP 123/78   Pulse 98   Temp (!) 100.7 F (38.2 C) (Oral)   Resp 20   Ht 6\' 1"  (1.854 m)   Wt 115 kg   SpO2 92%   BMI 33.45 kg/m  NAD Normal work breathing Soft, distended, appropriately tender, dressing in place without drainage, binder   POD 1 s/p lap chole and umbilical hernia repair. Doing fair.  PRN for pain IS, OOB and ambulate AKI, being worked up by Dr. Manuella Ghazi and nephrology Diet as tolerated IV antibiotics d/c tomorrow - periop from gangrenous cholecystitis   Discussed with Dr. Manuella Ghazi and RN.   Curlene Labrum, MD [facility] [address] [place], [place] [address] [phone] (office)

## 2019-08-01 NOTE — Discharge Instructions (Signed)
Discharge Surgery Instructions:  Common Complaints: Pain at the incision site is common. This will improve with time. Take your pain medications as described below. Some nausea is common and poor appetite. The main goal is to stay hydrated the first few days after surgery.   Diet/ Activity: Diet as tolerated. You have started and tolerated a diet in the hospital, and should continue to increase what you are able to eat.   You may not have a large appetite, but it is important to stay hydrated. Drink 64 ounces of water a day. Your appetite will return with time.  Keep a dry dressing in place over your staples daily or as needed. Some minor pink/ blood tinged drainage is expected. This will stop in a few days after surgery.  Shower per your regular routine daily.  Do not take hot showers. Take warm showers that are less than 10 minutes. Path the incision dry. Wear an abdominal binder daily with activity. You do not have to wear this while sleeping or sitting.  Rest and listen to your body, but do not remain in bed all day.  Walk everyday for at least 15-20 minutes. Deep cough and move around every 1-2 hours in the first few days after surgery.  Do not lift > 10 lbs, perform excessive bending, pushing, pulling, squatting for 6-8 weeks after surgery.  The activity restrictions and the abdominal binder are to prevent hernia formation at your incision while you are healing.  Do not place lotions or balms on your incision unless instructed to specifically by Dr. Constance Haw.   Pain Expectations and Narcotics: -After surgery you will have pain associated with your incisions and this is normal. The pain is muscular and nerve pain, and will get better with time. -You are encouraged and expected to take non narcotic medications like tylenol and ibuprofen (when able) to treat pain as multiple modalities can aid with pain treatment. -Narcotics are only used when pain is severe or there is breakthrough  pain. -You are not expected to have a pain score of 0 after surgery, as we cannot prevent pain. A pain score of 3-4 that allows you to be functional, move, walk, and tolerate some activity is the goal. The pain will continue to improve over the days after surgery and is dependent on your surgery. -Due to [place] law, we are only able to give a certain amount of pain medication to treat post operative pain, and we only give additional narcotics on a patient by patient basis.  -For most laparoscopic surgery, studies have shown that the majority of patients only need 10-15 narcotic pills, and for open surgeries most patients only need 15-20.   -Having appropriate expectations of pain and knowledge of pain management with non narcotics is important as we do not want anyone to become addicted to narcotic pain medication.  -Using ice packs in the first 48 hours and heating pads after 48 hours, wearing an abdominal binder (when recommended), and using over the counter medications are all ways to help with pain management.   -Simple acts like meditation and mindfulness practices after surgery can also help with pain control and research has proven the benefit of these practices.  Medication: Take tylenol and ibuprofen as needed for pain control, alternating every 4-6 hours.  Example:  Tylenol 1000mg  @ 6am, 12noon, 6pm, 60midnight (Do not exceed 4000mg  of tylenol a day). Ibuprofen 800mg  @ 9am, 3pm, 9pm, 3am (Do not exceed 3600mg  of ibuprofen a day).  Take  Roxicodone for breakthrough pain every 4 hours.  Take Colace for constipation related to narcotic pain medication. If you do not have a bowel movement in 2 days, take Miralax over the counter.  Drink plenty of water to also prevent constipation.   Contact Information: If you have questions or concerns, please call our office, [phone], Monday- Thursday 8AM-5PM and Friday 8AM-12Noon.  If it is after hours or on the weekend, please call [facility] Main Number,  [phone], and ask to speak to the surgeon on call for Dr. Constance Haw at [facility].    Laparoscopic Cholecystectomy, Care After This sheet gives you information about how to care for yourself after your procedure. Your doctor may also give you more specific instructions. If you have problems or questions, contact your doctor. Follow these instructions at home: Care for cuts from surgery (incisions)   Follow instructions from your doctor about how to take care of your cuts from surgery. Make sure you: ? Wash your hands with soap and water before you change your bandage (dressing). If you cannot use soap and water, use hand sanitizer. ? Change your bandage as told by your doctor. ? Leave stitches (sutures), skin glue, or skin tape (adhesive) strips in place. They may need to stay in place for 2 weeks or longer. If tape strips get loose and curl up, you may trim the loose edges. Do not remove tape strips completely unless your doctor says it is okay.  Do not take baths, swim, or use a hot tub until your doctor says it is okay.   You may shower.  Check your surgical cut area every day for signs of infection. Check for: ? More redness, swelling, or pain. ? More fluid or blood. ? Warmth. ? Pus or a bad smell. Activity  Do not drive or use heavy machinery while taking prescription pain medicine.  Do not lift anything that is heavier than 10 lb (4.5 kg) until your doctor says it is okay.  Do not play contact sports until your doctor says it is okay.  Do not drive for 24 hours if you were given a medicine to help you relax (sedative).  Rest as needed. Do not return to work or school until your doctor says it is okay. General instructions  Take over-the-counter and prescription medicines only as told by your doctor.  To prevent or treat constipation while you are taking prescription pain medicine, your doctor may recommend that you: ? Drink enough fluid to keep your pee (urine) clear or  pale yellow. ? Take over-the-counter or prescription medicines. ? Eat foods that are high in fiber, such as fresh fruits and vegetables, whole grains, and beans. ? Limit foods that are high in fat and processed sugars, such as fried and sweet foods. Contact a doctor if:  You develop a rash.  You have more redness, swelling, or pain around your surgical cuts.  You have more fluid or blood coming from your surgical cuts.  Your surgical cuts feel warm to the touch.  You have pus or a bad smell coming from your surgical cuts.  You have a fever.  One or more of your surgical cuts breaks open. Get help right away if:  You have trouble breathing.  You have chest pain.  You have pain that is getting worse in your shoulders.  You faint or feel dizzy when you stand.  You have very bad pain in your belly (abdomen).  You are sick to your stomach (nauseous)  for more than one day.  You have throwing up (vomiting) that lasts for more than one day.  You have leg pain. This information is not intended to replace advice given to you by your health care provider. Make sure you discuss any questions you have with your health care provider. Document Revised: 01/18/2017 Document Reviewed: 07/25/2015 Elsevier Patient Education  [employer], Adult, Care After These instructions give you information about caring for yourself after your procedure. Your doctor may also give you more specific instructions. If you have problems or questions, contact your doctor. Follow these instructions at home: Surgical cut (incision) care   Follow instructions from your doctor about how to take care of your surgical cut area. Make sure you: ? Wash your hands with soap and water before you change your bandage (dressing). If you cannot use soap and water, use hand sanitizer. ? Change your bandage as told by your doctor. ? Leave stitches (sutures), skin glue, or skin tape (adhesive)  strips in place. They may need to stay in place for 2 weeks or longer. If tape strips get loose and curl up, you may trim the loose edges. Do not remove tape strips completely unless your doctor says it is okay.  Check your surgical cut every day for signs of infection. Check for: ? More redness, swelling, or pain. ? More fluid or blood. ? Warmth. ? Pus or a bad smell. Activity  Do not drive or use heavy machinery while taking prescription pain medicine. Do not drive until your doctor says it is okay.  Until your doctor says it is okay: ? Do not lift anything that is heavier than 10 lb (4.5 kg). ? Do not play contact sports.  Return to your normal activities as told by your doctor. Ask your doctor what activities are safe. General instructions  To prevent or treat having a hard time pooping (constipation) while you are taking prescription pain medicine, your doctor may recommend that you: ? Drink enough fluid to keep your pee (urine) clear or pale yellow. ? Take over-the-counter or prescription medicines. ? Eat foods that are high in fiber, such as fresh fruits and vegetables, whole grains, and beans. ? Limit foods that are high in fat and processed sugars, such as fried and sweet foods.  Take over-the-counter and prescription medicines only as told by your doctor.  Do not take baths, swim, or use a hot tub until your doctor says it is okay.  Keep all follow-up visits as told by your doctor. This is important. Contact a doctor if:  You develop a rash.  You have more redness, swelling, or pain around your surgical cut.  You have more fluid or blood coming from your surgical cut.  Your surgical cut feels warm to the touch.  You have pus or a bad smell coming from your surgical cut.  You have a fever or chills.  You have blood in your poop (stool).  You have not pooped in 2-3 days.  Medicine does not help your pain. Get help right away if:  You have chest pain or you  are short of breath.  You feel light-headed.  You feel weak and dizzy (feel faint).  You have very bad pain.  You throw up (vomit) and your pain is worse. This information is not intended to replace advice given to you by your health care provider. Make sure you discuss any questions you have with your health care provider.  Document Revised: 05/30/2018 Document Reviewed: 07/20/2015 Elsevier Patient Education  [place].

## 2019-08-01 NOTE — Progress Notes (Signed)
Patient called nursing stated he was having trouble coughing up anything and felt short of breath. He asked if he could have breathing treatment? Checked on patient gave prn duoneb. Patient did have wheezes which cleared and stated he felt better. Started incentive with patient he did about 1000. He is on 4 liters saturation is 91 to 92. He does not wear oxygen at home.

## 2019-08-02 ENCOUNTER — Inpatient Hospital Stay (HOSPITAL_COMMUNITY): Payer: PPO

## 2019-08-02 LAB — COMPREHENSIVE METABOLIC PANEL
ALT: 50 U/L — ABNORMAL HIGH (ref 0–44)
AST: 45 U/L — ABNORMAL HIGH (ref 15–41)
Albumin: 3.1 g/dL — ABNORMAL LOW (ref 3.5–5.0)
Alkaline Phosphatase: 49 U/L (ref 38–126)
Anion gap: 12 (ref 5–15)
BUN: 42 mg/dL — ABNORMAL HIGH (ref 8–23)
CO2: 19 mmol/L — ABNORMAL LOW (ref 22–32)
Calcium: 7.8 mg/dL — ABNORMAL LOW (ref 8.9–10.3)
Chloride: 99 mmol/L (ref 98–111)
Creatinine, Ser: 3.61 mg/dL — ABNORMAL HIGH (ref 0.61–1.24)
GFR calc Af Amer: 19 mL/min — ABNORMAL LOW (ref >60)
GFR calc non Af Amer: 16 mL/min — ABNORMAL LOW (ref >60)
Glucose, Bld: 214 mg/dL — ABNORMAL HIGH (ref 70–99)
Potassium: 4.4 mmol/L (ref 3.5–5.1)
Sodium: 130 mmol/L — ABNORMAL LOW (ref 135–145)
Total Bilirubin: 0.9 mg/dL (ref 0.3–1.2)
Total Protein: 7.1 g/dL (ref 6.5–8.1)

## 2019-08-02 LAB — GLUCOSE, CAPILLARY
Glucose-Capillary: 131 mg/dL — ABNORMAL HIGH (ref 70–99)
Glucose-Capillary: 146 mg/dL — ABNORMAL HIGH (ref 70–99)
Glucose-Capillary: 152 mg/dL — ABNORMAL HIGH (ref 70–99)
Glucose-Capillary: 166 mg/dL — ABNORMAL HIGH (ref 70–99)
Glucose-Capillary: 177 mg/dL — ABNORMAL HIGH (ref 70–99)
Glucose-Capillary: 228 mg/dL — ABNORMAL HIGH (ref 70–99)

## 2019-08-02 LAB — CBC
HCT: 35.9 % — ABNORMAL LOW (ref 39.0–52.0)
Hemoglobin: 11.5 g/dL — ABNORMAL LOW (ref 13.0–17.0)
MCH: 29 pg (ref 26.0–34.0)
MCHC: 32 g/dL (ref 30.0–36.0)
MCV: 90.4 fL (ref 80.0–100.0)
Platelets: 215 10*3/uL (ref 150–400)
RBC: 3.97 MIL/uL — ABNORMAL LOW (ref 4.22–5.81)
RDW: 14.3 % (ref 11.5–15.5)
WBC: 10.5 10*3/uL (ref 4.0–10.5)
nRBC: 0 % (ref 0.0–0.2)

## 2019-08-02 LAB — CK: Total CK: 403 U/L — ABNORMAL HIGH (ref 49–397)

## 2019-08-02 MED ORDER — SODIUM CHLORIDE 0.9 % IV BOLUS
250.0000 mL | Freq: Once | INTRAVENOUS | Status: AC
  Administered 2019-08-02: 250 mL via INTRAVENOUS

## 2019-08-02 NOTE — Progress Notes (Signed)
Binder removed per Dr. Constance Haw.  O2 sat 94% on 5L [place].  Pt ambulated 150 ft with O2 before becoming too weak and SOB to go further.  Returned to room via wheelchair.  Maintained O2 sat between 92 - 94%.  With rest O2 sat rose to 100% and O2 adjusted to 3L.

## 2019-08-02 NOTE — Progress Notes (Signed)
MD notified of O2 increase to 6L. Pt O2 sat was 84% on 5L. Increased to 6L and pt sat 90-91%. Pt stated he is unable to take deep breaths due to pain, advised I would give prn pain medication (see mar), and reevaluate. MD ordered stat chest xray, will follow up upon completion

## 2019-08-02 NOTE — Progress Notes (Signed)
PROGRESS NOTE    Gabriel Schultz  FUX:[id number] DOB: [birth year] DOA: 07/31/2019 PCP: Gabriel Munch, PA-C   Brief Narrative:  Per HPI: Gabriel Schultz a 69 y.o.malewith medical history significant forCAD with prior MI in 2003, prior tobacco abuse, hypertension, dyslipidemia, chronic gout, type 2 diabetes, and GERD who presented to the ED initially on 6/10 with complaints of some epigastric and chest pain. He was noted to have some radiation of pain to the center of his back and had some nausea and vomiting. He was sent home after he was ruled out for acute coronary syndrome.He presented back today after he woke up with severe right upper quadrant abdominal pain with some dry heaving. There appears to be no exacerbating or alleviating factors and no radiation of the pain. He denies any fevers, chills, diarrhea, constipation, shortness of breath, or chest pain.  -Patient was admitted with acute cholecystitis and is status post laparoscopic cholecystectomy with removal of gangrenous gallbladder as well as umbilical hernia repair on 6/11.    He continues to have persistent hypoxemia requiring 5 L nasal cannula oxygen with bilateral atelectasis.  He is also noted to have AKI likely related to perioperative hypotension that is being evaluated by nephrology.   Assessment & Plan:   Principal Problem:   Acute gangrenous cholecystitis Active Problems:   Umbilical hernia without obstruction and without gangrene   Acute gangrenous cholecystitis with incarcerated umbilical hernia -Status post laparoscopic cholecystectomy and primary umbilical hernia repair on 6/11 -Advanced to carb modified diet -Rocephin discontinued after today's dose -Pain management with IV Dilaudid as needed -IV Zofran as needed  Acute hypoxemic respiratory failure -Related to postoperative atelectasis, noted to be severe and bilateral on chest x-ray overnight -Discussed with RN the need to ambulate patient  and use incentive spirometry -Up in chair  AKI on CKD stage IIIb -Likely with some ischemic ATN with some perioperative hypotension -Urine output only recorded as 475 mL -Creatinine noted to be 3.61 today with baseline progression over time, noted to be near 1.7-1.8 -Continue monitor strict I's and O's and daily weights -Continue IV fluids -Urine electrolytes evaluated with FENa 0.3% suggesting prerenal etiology -Renal ultrasound without any acute findings -Appreciate ongoing nephrology evaluation and management  History of CAD with prior MI in 2003/hypertension/dyslipidemia -Plan to hold blood pressure medications and statin for now  History of chronic gout -Resume home allopurinol  History of type 2 diabetes with noted hyperglycemia-improved -Placed on SSI with Accu-Cheks every 4 hours -Hold home Metformin and glipizide until further dietary advancement -Hemoglobin A1c 7.4%  History of GERD -IV PPI daily  Obesity -BMI 33.45 kg/m -Recommend lifestyle changes   DVT prophylaxis: Heparin Code Status: Full code Family Communication:  And discussed with wife at bedside. Disposition Plan:  Antibiotics completed. Appreciate ongoing nephrology involvement for AKI work-up and treatment. Wean oxygen as tolerated. Status is: Inpatient  Remains inpatient appropriate because:IV treatments appropriate due to intensity of illness or inability to take PO and Inpatient level of care appropriate due to severity of illness   Dispo: The patient is from: Home  Anticipated d/c is to: Home  Anticipated d/c date is: 3 days  Patient currently is not medically stable to d/c. He continues to have poor renal function which needs close monitoring. Oxygen has to be weaned.  Consultants:   General surgery-Dr. Constance Haw  Nephrology  Procedures:   Status post laparoscopic cholecystectomy as well as umbilical hernia repair 0/25  Antimicrobials:   Anti-infectives (From admission, onward)  Start     Dose/Rate Route Frequency Ordered Stop   08/01/19 1100  cefTRIAXone (ROCEPHIN) 2 g in sodium chloride 0.9 % 100 mL IVPB  Status:  Discontinued        2 g 200 mL/hr over 30 Minutes Intravenous Every 24 hours 07/31/19 1251 07/31/19 1831   08/01/19 1100  cefTRIAXone (ROCEPHIN) 2 g in sodium chloride 0.9 % 100 mL IVPB        2 g 200 mL/hr over 30 Minutes Intravenous Every 24 hours 07/31/19 1831 08/02/19 1036   08/01/19 0600  cefoTEtan (CEFOTAN) 2 g in sodium chloride 0.9 % 100 mL IVPB        2 g 200 mL/hr over 30 Minutes Intravenous On call to O.R. 07/31/19 1316 08/01/19 0916   07/31/19 1100  cefTRIAXone (ROCEPHIN) 2 g in sodium chloride 0.9 % 100 mL IVPB        2 g 200 mL/hr over 30 Minutes Intravenous  Once 07/31/19 1057 07/31/19 1153       Subjective: Patient seen and evaluated today and he was noted to have worsening hypoxemia overnight with diminished breath sounds. Chest x-ray obtained showing worsening bilateral atelectasis. He was noted to have some concentrated urine and was given a bolus of IV fluid as well. He appears to be tolerating his diet  Objective: Vitals:   08/01/19 2200 08/01/19 2358 08/02/19 0144 08/02/19 0427  BP:  (!) 151/87 (!) 141/78 120/68  Pulse: (!) 104 (!) 106 (!) 104 (!) 109  Resp: 20 (!) 22 20 20   Temp: 100.3 F (37.9 C) 100.1 F (37.8 C) 99.2 F (37.3 C) 99.2 F (37.3 C)  TempSrc: Oral Oral Oral Oral  SpO2: 92% 92% 93% 95%  Weight:      Height:        Intake/Output Summary (Last 24 hours) at 08/02/2019 1225 Last data filed at 08/02/2019 0154 Gross per 24 hour  Intake 614.63 ml  Output 475 ml  Net 139.63 ml   Filed Weights   07/31/19 0942  Weight: 115 kg    Examination:  General exam: Appears calm and comfortable  Respiratory system: Clear to auscultation. Respiratory effort normal.  Currently on 5 L nasal cannula oxygen. Cardiovascular system: S1 & S2 heard, RRR. No JVD, murmurs,  rubs, gallops or clicks. No pedal edema. Gastrointestinal system: Abdomen is distended, soft and nontender. No organomegaly or masses felt. Normal bowel sounds heard.  Incisions clean dry and intact. Central nervous system: Alert and oriented. No focal neurological deficits. Extremities: Symmetric 5 x 5 power. Skin: No rashes, lesions or ulcers Psychiatry: Flat affect.    Data Reviewed: I have personally reviewed following labs and imaging studies  CBC: Recent Labs  Lab 07/30/19 1535 07/31/19 0950 08/01/19 0643 08/02/19 0852  WBC 9.8 12.1* 9.7 10.5  NEUTROABS  --  8.5*  --   --   HGB 13.4 13.5 12.0* 11.5*  HCT 40.2 41.1 37.2* 35.9*  MCV 88.2 89.3 90.3 90.4  PLT 194 235 207 784   Basic Metabolic Panel: Recent Labs  Lab 07/30/19 1535 07/31/19 0950 08/01/19 0643 08/02/19 0852  NA 133* 135 136 130*  K 4.3 4.4 4.7 4.4  CL 102 99 101 99  CO2 21* 21* 20* 19*  GLUCOSE 227* 210* 147* 214*  BUN 19 19 34* 42*  CREATININE 1.86* 2.05* 3.75* 3.61*  CALCIUM 8.9 9.0 8.1* 7.8*  MG  --   --  1.8  --    GFR: Estimated Creatinine Clearance: 25.6  mL/min (A) (by C-G formula based on SCr of 3.61 mg/dL (H)). Liver Function Tests: Recent Labs  Lab 07/30/19 1535 07/31/19 0950 08/01/19 0643 08/02/19 0852  AST 19 14* 81* 45*  ALT 17 16 60* 50*  ALKPHOS 52 52 44 49  BILITOT 1.0 1.4* 0.9 0.9  PROT 7.5 7.7 6.8 7.1  ALBUMIN 3.8 3.7 3.4* 3.1*   Recent Labs  Lab 07/30/19 1535 07/31/19 0950  LIPASE 25 20   No results for input(s): AMMONIA in the last 168 hours. Coagulation Profile: No results for input(s): INR, PROTIME in the last 168 hours. Cardiac Enzymes: Recent Labs  Lab 08/02/19 0852  CKTOTAL 403*   BNP (last 3 results) No results for input(s): PROBNP in the last 8760 hours. HbA1C: Recent Labs    07/31/19 1301  HGBA1C 7.4*   CBG: Recent Labs  Lab 08/01/19 2051 08/02/19 0009 08/02/19 0340 08/02/19 0756 08/02/19 1130  GLUCAP 157* 131* 166* 152* 177*   Lipid  Profile: No results for input(s): CHOL, HDL, LDLCALC, TRIG, CHOLHDL, LDLDIRECT in the last 72 hours. Thyroid Function Tests: No results for input(s): TSH, T4TOTAL, FREET4, T3FREE, THYROIDAB in the last 72 hours. Anemia Panel: No results for input(s): VITAMINB12, FOLATE, FERRITIN, TIBC, IRON, RETICCTPCT in the last 72 hours. Sepsis Labs: No results for input(s): PROCALCITON, LATICACIDVEN in the last 168 hours.  Recent Results (from the past 240 hour(s))  SARS Coronavirus 2 by RT PCR (hospital order, performed in [facility] hospital lab) Nasopharyngeal Nasopharyngeal Swab     Status: None   Collection Time: 07/31/19 12:09 PM   Specimen: Nasopharyngeal Swab  Result Value Ref Range Status   SARS Coronavirus 2 NEGATIVE NEGATIVE Final    Comment: (NOTE) SARS-CoV-2 target nucleic acids are NOT DETECTED.  The SARS-CoV-2 RNA is generally detectable in upper and lower respiratory specimens during the acute phase of infection. The lowest concentration of SARS-CoV-2 viral copies this assay can detect is 250 copies / mL. A negative result does not preclude SARS-CoV-2 infection and should not be used as the sole basis for treatment or other patient management decisions.  A negative result may occur with improper specimen collection / handling, submission of specimen other than nasopharyngeal swab, presence of viral mutation(s) within the areas targeted by this assay, and inadequate number of viral copies (<250 copies / mL). A negative result must be combined with clinical observations, patient history, and epidemiological information.  Fact Sheet for Patients:   StrictlyIdeas.no  Fact Sheet for Healthcare Providers: BankingDealers.co.za  This test is not yet approved or  cleared by the [place] FDA and has been authorized for detection and/or diagnosis of SARS-CoV-2 by FDA under an Emergency Use Authorization (EUA).  This EUA will remain in  effect (meaning this test can be used) for the duration of the COVID-19 declaration under Section 564(b)(1) of the Act, 21 U.S.C. section 360bbb-3(b)(1), unless the authorization is terminated or revoked sooner.  Performed at [facility], [address] [place], [place] [address]          Radiology Studies: US RENAL  Result Date: 08/02/2019 CLINICAL DATA:  Acute kidney injury EXAM: RENAL / URINARY TRACT ULTRASOUND COMPLETE COMPARISON:  11/29/2015 FINDINGS: Right Kidney: Renal measurements: 11 x 6 x 6 cm = volume: 220 mL . Echogenicity within normal limits. No solid mass or hydronephrosis visualized. 5 mm interpolar cystic density Left Kidney: Renal measurements: 11 x 7 x 6 cm = volume: 270 mL. Echogenicity within normal limits. No solid mass or hydronephrosis  visualized. 7 mm cystic structure. Bladder: Appears normal for degree of bladder distention. IMPRESSION: Essentially negative renal ultrasound. Electronically Signed   By: Monte Fantasia M.D.   On: 08/02/2019 08:52   DG CHEST PORT 1 VIEW  Result Date: 08/02/2019 CLINICAL DATA:  Right side pain EXAM: PORTABLE CHEST 1 VIEW COMPARISON:  07/31/2019 FINDINGS: Very low lung volumes. Bibasilar atelectasis. Heart is accentuated by the low volumes. No visible effusions. No acute bony abnormality. IMPRESSION: Very low lung volumes, worsening since prior study. Bibasilar atelectasis. Electronically Signed   By: Rolm Baptise M.D.   On: 08/02/2019 00:37        Scheduled Meds: . guaiFENesin  1,200 mg Oral BID  . heparin  5,000 Units Subcutaneous Q8H  . insulin aspart  0-15 Units Subcutaneous Q4H  . pantoprazole (PROTONIX) IV  40 mg Intravenous Q24H   Continuous Infusions: . lactated ringers 75 mL/hr at 08/01/19 1247     LOS: 2 days    Time spent: 35 minutes    Mirely Pangle Darleen Crocker, DO [facility]  If 7PM-7AM, please contact night-coverage [email or web address] 08/02/2019, 12:25 PM

## 2019-08-02 NOTE — Progress Notes (Signed)
[facility] night shift.  The patient was seen due to hypoxia in the mid 80s despite using supplemental oxygen.  He was sounding diminished and had poor inspiratory effort due to abdominal pain.  Chest radiograph showed worsening bilateral atelectasis.  He was asked to do incentive spirometry as much as he could tolerate.  He was advised to assess staff for analgesics if needed before doing the respiratory exercises.  He also stated that he has urinated about 4 times since earlier today.  The urine still looks a little concentrated, but he is getting LR at 75 mL/hr and drinking fluids.  I added a 250 mL NS bolus. Will be following renal function later this morning.  Tennis Must, MD

## 2019-08-02 NOTE — Progress Notes (Signed)
[facility]  Requiring O2 and atelectasis. Also with improving AKI. Not eating much but taking in some liquids.  BP 120/68 (BP Location: Left Arm)   Pulse (!) 109   Temp 99.2 F (37.3 C) (Oral)   Resp 20   Ht 6\' 1"  (1.854 m)   Wt 115 kg   SpO2 95%   BMI 33.45 kg/m  NAD Getting IV Distended w/ binder in place  Patient s/p lap cholecystectomy and primary umbilical hernia repair. Doing fair but some O2 requirements and atelectasis, AKI improving.  Diet as tolerated Ambulate and IS  Curlene Labrum, MD [facility] [address] [place], [place] [address] [phone] (office)

## 2019-08-03 ENCOUNTER — Encounter (HOSPITAL_COMMUNITY): Payer: Self-pay | Admitting: General Surgery

## 2019-08-03 LAB — COMPREHENSIVE METABOLIC PANEL
ALT: 39 U/L (ref 0–44)
AST: 26 U/L (ref 15–41)
Albumin: 3 g/dL — ABNORMAL LOW (ref 3.5–5.0)
Alkaline Phosphatase: 51 U/L (ref 38–126)
Anion gap: 15 (ref 5–15)
BUN: 38 mg/dL — ABNORMAL HIGH (ref 8–23)
CO2: 17 mmol/L — ABNORMAL LOW (ref 22–32)
Calcium: 8.2 mg/dL — ABNORMAL LOW (ref 8.9–10.3)
Chloride: 100 mmol/L (ref 98–111)
Creatinine, Ser: 2.99 mg/dL — ABNORMAL HIGH (ref 0.61–1.24)
GFR calc Af Amer: 24 mL/min — ABNORMAL LOW (ref >60)
GFR calc non Af Amer: 20 mL/min — ABNORMAL LOW (ref >60)
Glucose, Bld: 207 mg/dL — ABNORMAL HIGH (ref 70–99)
Potassium: 3.9 mmol/L (ref 3.5–5.1)
Sodium: 132 mmol/L — ABNORMAL LOW (ref 135–145)
Total Bilirubin: 0.7 mg/dL (ref 0.3–1.2)
Total Protein: 7.1 g/dL (ref 6.5–8.1)

## 2019-08-03 LAB — GLUCOSE, CAPILLARY
Glucose-Capillary: 152 mg/dL — ABNORMAL HIGH (ref 70–99)
Glucose-Capillary: 165 mg/dL — ABNORMAL HIGH (ref 70–99)
Glucose-Capillary: 177 mg/dL — ABNORMAL HIGH (ref 70–99)
Glucose-Capillary: 180 mg/dL — ABNORMAL HIGH (ref 70–99)
Glucose-Capillary: 180 mg/dL — ABNORMAL HIGH (ref 70–99)
Glucose-Capillary: 191 mg/dL — ABNORMAL HIGH (ref 70–99)
Glucose-Capillary: 198 mg/dL — ABNORMAL HIGH (ref 70–99)

## 2019-08-03 LAB — CBC
HCT: 34.9 % — ABNORMAL LOW (ref 39.0–52.0)
Hemoglobin: 11.3 g/dL — ABNORMAL LOW (ref 13.0–17.0)
MCH: 29.4 pg (ref 26.0–34.0)
MCHC: 32.4 g/dL (ref 30.0–36.0)
MCV: 90.6 fL (ref 80.0–100.0)
Platelets: 216 10*3/uL (ref 150–400)
RBC: 3.85 MIL/uL — ABNORMAL LOW (ref 4.22–5.81)
RDW: 14.4 % (ref 11.5–15.5)
WBC: 9.4 10*3/uL (ref 4.0–10.5)
nRBC: 0 % (ref 0.0–0.2)

## 2019-08-03 MED ORDER — METOPROLOL SUCCINATE ER 50 MG PO TB24
50.0000 mg | ORAL_TABLET | Freq: Every day | ORAL | Status: DC
  Administered 2019-08-03 – 2019-08-05 (×3): 50 mg via ORAL
  Filled 2019-08-03 (×4): qty 1

## 2019-08-03 MED ORDER — LORAZEPAM 2 MG/ML IJ SOLN: 2.0000 mg | Freq: Once | INTRAMUSCULAR | Status: DC

## 2019-08-03 MED ORDER — HALOPERIDOL LACTATE 5 MG/ML IJ SOLN: 5.0000 mg | Freq: Once | INTRAMUSCULAR | Status: DC | PRN
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
## 2019-08-03 NOTE — Progress Notes (Signed)
PROGRESS NOTE    JOHNOTHAN Schultz  FGH:[id number] DOB: 03-08-1950 DOA: 07/31/2019 PCP: Cory Munch, PA-C   Brief Narrative:  Per HPI: Gabriel Knotek Mooreis a 69 y.o.malewith medical history significant forCAD with prior MI in 2003, prior tobacco abuse, hypertension, dyslipidemia, chronic gout, type 2 diabetes, and GERD who presented to the ED initially on 6/10 with complaints of some epigastric and chest pain. He was noted to have some radiation of pain to the center of his back and had some nausea and vomiting. He was sent home after he was ruled out for acute coronary syndrome.He presented back today after he woke up with severe right upper quadrant abdominal pain with some dry heaving. There appears to be no exacerbating or alleviating factors and no radiation of the pain. He denies any fevers, chills, diarrhea, constipation, shortness of breath, or chest pain.  -Patient was admitted with acute cholecystitis and is status post laparoscopic cholecystectomy with removal of gangrenous gallbladder as well as umbilical hernia repair on 6/11.  He continues to have persistent hypoxemia, but is being weaned from his oxygen requirement and is down to 3 L currently.  He was noted to have some agitation and hallucinations last night requiring some Haldol.  AKI is improving and he is tolerating diet, therefore we will avoid further IV fluid for now.  Continue to monitor a.m. labs.  Assessment & Plan:   Principal Problem:   Acute gangrenous cholecystitis Active Problems:   Umbilical hernia without obstruction and without gangrene   Acutegangrenous cholecystitis with incarcerated umbilical hernia -Status post laparoscopic cholecystectomy and primary umbilical hernia repair on 6/11 -Advanced to carb modified diet and is tolerating diet well -Rocephin discontinued on 6/13 -Pain management with IV Dilaudid as needed -IV Zofran as needed  Acute hypoxemic respiratory  failure-improving -Related to postoperative atelectasis, noted to be severe and bilateral on chest x-ray overnight -Weaned from 5 L to 3 L today -Discussed with RN the need to ambulate patient and use incentive spirometry -Up in chair  AKI on CKD stage IIIb -Likely with some ischemic ATN with some perioperative hypotension -Urine output only recorded as 1000 mL overnight -Creatinine noted to be 2.9 today with baseline noted to be near 1.7-1.8 -Continue monitor strict I's and O's and daily weights -Hold further IV fluid and resume if poor p.o. intake or worsening nausea or vomiting noted. -Urine electrolytes evaluated with FENa 0.3% suggesting prerenal etiology -Renal ultrasound without any acute findings -Appreciate ongoing nephrology evaluation and management  History of CAD with prior MI in 2003/hypertension/dyslipidemia -Plan to hold blood pressure medications and statin for now  History of chronic gout -Resume home allopurinol  History of type 2 diabetes with noted hyperglycemia-improved -Placed on SSI with Accu-Cheks every 4 hours -Hold home Metformin and glipizidefor now -Hemoglobin A1c 7.4%  History of GERD -IV PPI daily  Obesity -BMI 33.45 kg/m -Recommend lifestyle changes  Delirium -Likely secondary to hospitalization -Required some Haldol overnight and has this as needed   DVT prophylaxis:Heparin Code Status:Full code Family Communication: Discussed with wife and brother at bedside 6/15. Disposition Plan: Antibiotics completed. Appreciate ongoing nephrology involvement for AKI work-up and treatment. Wean oxygen as tolerated. Status is: Inpatient  Remains inpatient appropriate because:IV treatments appropriate due to intensity of illness or inability to take PO and Inpatient level of care appropriate due to severity of illness   Dispo: The patient is from:Home Anticipated d/c is CV:ELFY Anticipated d/c date is:  1-2 days Patient currently is not medically  stable to d/c. He continues to have poor renal function which needs close monitoring, but this is improving. Oxygen has to be weaned further to room air.  Consultants:  General surgery-Dr. Constance Haw  Nephrology  Procedures:  Status post laparoscopic cholecystectomy as well as umbilical hernia repair 5/32  Antimicrobials:  Anti-infectives (From admission, onward)   Start     Dose/Rate Route Frequency Ordered Stop   08/01/19 1100  cefTRIAXone (ROCEPHIN) 2 g in sodium chloride 0.9 % 100 mL IVPB  Status:  Discontinued        2 g 200 mL/hr over 30 Minutes Intravenous Every 24 hours 07/31/19 1251 07/31/19 1831   08/01/19 1100  cefTRIAXone (ROCEPHIN) 2 g in sodium chloride 0.9 % 100 mL IVPB        2 g 200 mL/hr over 30 Minutes Intravenous Every 24 hours 07/31/19 1831 08/02/19 1036   08/01/19 0600  cefoTEtan (CEFOTAN) 2 g in sodium chloride 0.9 % 100 mL IVPB        2 g 200 mL/hr over 30 Minutes Intravenous On call to O.R. 07/31/19 1316 08/01/19 0916   07/31/19 1100  cefTRIAXone (ROCEPHIN) 2 g in sodium chloride 0.9 % 100 mL IVPB        2 g 200 mL/hr over 30 Minutes Intravenous  Once 07/31/19 1057 07/31/19 1153       Subjective: Patient seen and evaluated today with no new acute complaints or concerns.  He appears to be tolerating his diet well and did have a bowel movement overnight.  He was noted to have some agitation and hallucinations overnight requiring some Haldol.  Objective: Vitals:   08/02/19 1904 08/02/19 2007 08/03/19 0738 08/03/19 0942  BP:  (!) 150/89 (!) 185/93 131/79  Pulse:  (!) 109 (!) 114 (!) 107  Resp:  20 20 20   Temp: 98.4 F (36.9 C) 99.3 F (37.4 C) 99.7 F (37.6 C) 98.9 F (37.2 C)  TempSrc: Oral Oral Oral Oral  SpO2:  100% 93% 94%  Weight:      Height:        Intake/Output Summary (Last 24 hours) at 08/03/2019 1311 Last data filed at 08/02/2019 1757 Gross per 24 hour  Intake --  Output 450  ml  Net -450 ml   Filed Weights   07/31/19 0942  Weight: 115 kg    Examination:  General exam: Appears calm and comfortable  Respiratory system: Clear to auscultation. Respiratory effort normal.  Currently on 3 L nasal cannula oxygen. Cardiovascular system: S1 & S2 heard, RRR. No JVD, murmurs, rubs, gallops or clicks. No pedal edema. Gastrointestinal system: Abdomen with incisions clean dry and intact.  Continues to have some distention. Central nervous system: Alert and awake Extremities: Symmetric 5 x 5 power. Skin: No rashes, lesions or ulcers Psychiatry: Difficult to assess    Data Reviewed: I have personally reviewed following labs and imaging studies  CBC: Recent Labs  Lab 07/30/19 1535 07/31/19 0950 08/01/19 0643 08/02/19 0852 08/03/19 0604  WBC 9.8 12.1* 9.7 10.5 9.4  NEUTROABS  --  8.5*  --   --   --   HGB 13.4 13.5 12.0* 11.5* 11.3*  HCT 40.2 41.1 37.2* 35.9* 34.9*  MCV 88.2 89.3 90.3 90.4 90.6  PLT 194 235 207 215 992   Basic Metabolic Panel: Recent Labs  Lab 07/30/19 1535 07/31/19 0950 08/01/19 0643 08/02/19 0852 08/03/19 0604  NA 133* 135 136 130* 132*  K 4.3 4.4 4.7 4.4 3.9  CL 102 99 101  99 100  CO2 21* 21* 20* 19* 17*  GLUCOSE 227* 210* 147* 214* 207*  BUN 19 19 34* 42* 38*  CREATININE 1.86* 2.05* 3.75* 3.61* 2.99*  CALCIUM 8.9 9.0 8.1* 7.8* 8.2*  MG  --   --  1.8  --   --    GFR: Estimated Creatinine Clearance: 31 mL/min (A) (by C-G formula based on SCr of 2.99 mg/dL (H)). Liver Function Tests: Recent Labs  Lab 07/30/19 1535 07/31/19 0950 08/01/19 0643 08/02/19 0852 08/03/19 0604  AST 19 14* 81* 45* 26  ALT 17 16 60* 50* 39  ALKPHOS 52 52 44 49 51  BILITOT 1.0 1.4* 0.9 0.9 0.7  PROT 7.5 7.7 6.8 7.1 7.1  ALBUMIN 3.8 3.7 3.4* 3.1* 3.0*   Recent Labs  Lab 07/30/19 1535 07/31/19 0950  LIPASE 25 20   No results for input(s): AMMONIA in the last 168 hours. Coagulation Profile: No results for input(s): INR, PROTIME in the last  168 hours. Cardiac Enzymes: Recent Labs  Lab 08/02/19 0852  CKTOTAL 403*   BNP (last 3 results) No results for input(s): PROBNP in the last 8760 hours. HbA1C: No results for input(s): HGBA1C in the last 72 hours. CBG: Recent Labs  Lab 08/02/19 2001 08/03/19 0007 08/03/19 0439 08/03/19 0735 08/03/19 1134  GLUCAP 228* 165* 180* 191* 198*   Lipid Profile: No results for input(s): CHOL, HDL, LDLCALC, TRIG, CHOLHDL, LDLDIRECT in the last 72 hours. Thyroid Function Tests: No results for input(s): TSH, T4TOTAL, FREET4, T3FREE, THYROIDAB in the last 72 hours. Anemia Panel: No results for input(s): VITAMINB12, FOLATE, FERRITIN, TIBC, IRON, RETICCTPCT in the last 72 hours. Sepsis Labs: No results for input(s): PROCALCITON, LATICACIDVEN in the last 168 hours.  Recent Results (from the past 240 hour(s))  SARS Coronavirus 2 by RT PCR (hospital order, performed in [facility] hospital lab) Nasopharyngeal Nasopharyngeal Swab     Status: None   Collection Time: 07/31/19 12:09 PM   Specimen: Nasopharyngeal Swab  Result Value Ref Range Status   SARS Coronavirus 2 NEGATIVE NEGATIVE Final    Comment: (NOTE) SARS-CoV-2 target nucleic acids are NOT DETECTED.  The SARS-CoV-2 RNA is generally detectable in upper and lower respiratory specimens during the acute phase of infection. The lowest concentration of SARS-CoV-2 viral copies this assay can detect is 250 copies / mL. A negative result does not preclude SARS-CoV-2 infection and should not be used as the sole basis for treatment or other patient management decisions.  A negative result may occur with improper specimen collection / handling, submission of specimen other than nasopharyngeal swab, presence of viral mutation(s) within the areas targeted by this assay, and inadequate number of viral copies (<250 copies / mL). A negative result must be combined with clinical observations, patient history, and epidemiological  information.  Fact Sheet for Patients:   StrictlyIdeas.no  Fact Sheet for Healthcare Providers: BankingDealers.co.za  This test is not yet approved or  cleared by the [place] FDA and has been authorized for detection and/or diagnosis of SARS-CoV-2 by FDA under an Emergency Use Authorization (EUA).  This EUA will remain in effect (meaning this test can be used) for the duration of the COVID-19 declaration under Section 564(b)(1) of the Act, 21 U.S.C. section 360bbb-3(b)(1), unless the authorization is terminated or revoked sooner.  Performed at [facility], [address] [place], [place] [address]          Radiology Studies: US RENAL  Result Date: 08/02/2019 CLINICAL DATA:  Acute kidney  injury EXAM: RENAL / URINARY TRACT ULTRASOUND COMPLETE COMPARISON:  11/29/2015 FINDINGS: Right Kidney: Renal measurements: 11 x 6 x 6 cm = volume: 220 mL . Echogenicity within normal limits. No solid mass or hydronephrosis visualized. 5 mm interpolar cystic density Left Kidney: Renal measurements: 11 x 7 x 6 cm = volume: 270 mL. Echogenicity within normal limits. No solid mass or hydronephrosis visualized. 7 mm cystic structure. Bladder: Appears normal for degree of bladder distention. IMPRESSION: Essentially negative renal ultrasound. Electronically Signed   By: Monte Fantasia M.D.   On: 08/02/2019 08:52   DG CHEST PORT 1 VIEW  Result Date: 08/02/2019 CLINICAL DATA:  Right side pain EXAM: PORTABLE CHEST 1 VIEW COMPARISON:  07/31/2019 FINDINGS: Very low lung volumes. Bibasilar atelectasis. Heart is accentuated by the low volumes. No visible effusions. No acute bony abnormality. IMPRESSION: Very low lung volumes, worsening since prior study. Bibasilar atelectasis. Electronically Signed   By: Rolm Baptise M.D.   On: 08/02/2019 00:37        Scheduled Meds: . guaiFENesin  1,200 mg Oral BID  . heparin  5,000 Units Subcutaneous Q8H  . insulin  aspart  0-15 Units Subcutaneous Q4H  . LORazepam  2 mg Intramuscular Once  . metoprolol succinate  50 mg Oral Daily  . pantoprazole (PROTONIX) IV  40 mg Intravenous Q24H    LOS: 3 days    Time spent: 30 minutes    Jacky Hartung Darleen Crocker, DO [facility]  If 7PM-7AM, please contact night-coverage [email or web address] 08/03/2019, 1:11 PM

## 2019-08-03 NOTE — Progress Notes (Signed)
Patient ID: KRU ALLMAN, male   DOB: 05/11/1950, 69 y.o.   MRN: [id number] S: Feeling better today O:BP (!) 185/93 (BP Location: Left Arm)   Pulse (!) 114   Temp 99.7 F (37.6 C) (Oral)   Resp 20   Ht 6\' 1"  (1.854 m)   Wt 115 kg   SpO2 93%   BMI 33.45 kg/m   Intake/Output Summary (Last 24 hours) at 08/03/2019 0855 Last data filed at 08/02/2019 1757 Gross per 24 hour  Intake 360 ml  Output 1000 ml  Net -640 ml   Intake/Output: I/O last 3 completed shifts: In: 360 [P.O.:360] Out: 1200 [Urine:1200]  Intake/Output this shift:  No intake/output data recorded. Weight change:  Gen: NAD CVS: tachy at 114, no rub Resp: cta Abd: +BS, mildly tender Ext: no edema  Recent Labs  Lab 07/30/19 1535 07/31/19 0950 08/01/19 0643 08/02/19 0852 08/03/19 0604  NA 133* 135 136 130* 132*  K 4.3 4.4 4.7 4.4 3.9  CL 102 99 101 99 100  CO2 21* 21* 20* 19* 17*  GLUCOSE 227* 210* 147* 214* 207*  BUN 19 19 34* 42* 38*  CREATININE 1.86* 2.05* 3.75* 3.61* 2.99*  ALBUMIN 3.8 3.7 3.4* 3.1* 3.0*  CALCIUM 8.9 9.0 8.1* 7.8* 8.2*  AST 19 14* 81* 45* 26  ALT 17 16 60* 50* 39   Liver Function Tests: Recent Labs  Lab 08/01/19 0643 08/02/19 0852 08/03/19 0604  AST 81* 45* 26  ALT 60* 50* 39  ALKPHOS 44 49 51  BILITOT 0.9 0.9 0.7  PROT 6.8 7.1 7.1  ALBUMIN 3.4* 3.1* 3.0*   Recent Labs  Lab 07/30/19 1535 07/31/19 0950  LIPASE 25 20   No results for input(s): AMMONIA in the last 168 hours. CBC: Recent Labs  Lab 07/30/19 1535 07/30/19 1535 07/31/19 0950 07/31/19 0950 08/01/19 0643 08/02/19 0852 08/03/19 0604  WBC 9.8   < > 12.1*   < > 9.7 10.5 9.4  NEUTROABS  --   --  8.5*  --   --   --   --   HGB 13.4   < > 13.5   < > 12.0* 11.5* 11.3*  HCT 40.2   < > 41.1   < > 37.2* 35.9* 34.9*  MCV 88.2  --  89.3  --  90.3 90.4 90.6  PLT 194   < > 235   < > 207 215 216   < > = values in this interval not displayed.   Cardiac Enzymes: Recent Labs  Lab 08/02/19 0852  CKTOTAL 403*    CBG: Recent Labs  Lab 08/02/19 1726 08/02/19 2001 08/03/19 0007 08/03/19 0439 08/03/19 0735  GLUCAP 146* 228* 165* 180* 191*    Iron Studies: No results for input(s): IRON, TIBC, TRANSFERRIN, FERRITIN in the last 72 hours. Studies/Results: US RENAL  Result Date: 08/02/2019 CLINICAL DATA:  Acute kidney injury EXAM: RENAL / URINARY TRACT ULTRASOUND COMPLETE COMPARISON:  11/29/2015 FINDINGS: Right Kidney: Renal measurements: 11 x 6 x 6 cm = volume: 220 mL . Echogenicity within normal limits. No solid mass or hydronephrosis visualized. 5 mm interpolar cystic density Left Kidney: Renal measurements: 11 x 7 x 6 cm = volume: 270 mL. Echogenicity within normal limits. No solid mass or hydronephrosis visualized. 7 mm cystic structure. Bladder: Appears normal for degree of bladder distention. IMPRESSION: Essentially negative renal ultrasound. Electronically Signed   By: Monte Fantasia M.D.   On: 08/02/2019 08:52   DG CHEST PORT 1 VIEW  Result Date: 08/02/2019 CLINICAL DATA:  Right side pain EXAM: PORTABLE CHEST 1 VIEW COMPARISON:  07/31/2019 FINDINGS: Very low lung volumes. Bibasilar atelectasis. Heart is accentuated by the low volumes. No visible effusions. No acute bony abnormality. IMPRESSION: Very low lung volumes, worsening since prior study. Bibasilar atelectasis. Electronically Signed   By: Rolm Baptise M.D.   On: 08/02/2019 00:37   . guaiFENesin  1,200 mg Oral BID  . heparin  5,000 Units Subcutaneous Q8H  . insulin aspart  0-15 Units Subcutaneous Q4H  . LORazepam  2 mg Intramuscular Once  . pantoprazole (PROTONIX) IV  40 mg Intravenous Q24H    BMET    Component Value Date/Time   NA 132 (L) 08/03/2019 0604   K 3.9 08/03/2019 0604   CL 100 08/03/2019 0604   CO2 17 (L) 08/03/2019 0604   GLUCOSE 207 (H) 08/03/2019 0604   BUN 38 (H) 08/03/2019 0604   CREATININE 2.99 (H) 08/03/2019 0604   CALCIUM 8.2 (L) 08/03/2019 0604   GFRNONAA 20 (L) 08/03/2019 0604   GFRAA 24 (L) 08/03/2019  0604   CBC    Component Value Date/Time   WBC 9.4 08/03/2019 0604   RBC 3.85 (L) 08/03/2019 0604   HGB 11.3 (L) 08/03/2019 0604   HCT 34.9 (L) 08/03/2019 0604   PLT 216 08/03/2019 0604   MCV 90.6 08/03/2019 0604   MCH 29.4 08/03/2019 0604   MCHC 32.4 08/03/2019 0604   RDW 14.4 08/03/2019 0604   LYMPHSABS 2.3 07/31/2019 0950   MONOABS 1.3 (H) 07/31/2019 0950   EOSABS 0.0 07/31/2019 0950   BASOSABS 0.0 07/31/2019 0950     Assessment/Plan:  1. AKI/CKD stage IIIb, non-oliguric- presumably due to ischemic ATN in setting of hypotension and sepsis.  Increased UOP and improvement of Scr.  Continue to follow for now.  No indication for dialysis.  1. Continue to hold metformin 2. Avoid all NSAIDs and COX-II I's  2. Acute gangrenous cholecystitis with incarcerated umbilical hernia- s/p lap chole and primary umbilical hernia repair on 6/11.  Completed IV rocephin,   3. CKD stage IIIb- presumably due to DM, HTN, and chronic NSAID use.  Baseline appears to be 1.7-1.9.  Will need follow up after discharge 4. HTN- BP's elevated.  OD to resume metoprolol xl 50 mg daily and eventually resume amlodipine 10 mg if BP's remain elevated. 5. DM type II- metformin stopped due to AKI 6. CAD- h/o MI in 2003.  Stable. 7. Gout- per primary 8. GERD- on PPI 9. Chronic back pain- need an alternative to NSAIDs.  Donetta Potts, MD [employer] [phone]

## 2019-08-03 NOTE — Care Management Important Message (Signed)
Important Message  Patient Details  Name: Gabriel Schultz MRN: [id number] Date of Birth: 1950-11-26   Medicare Important Message Given:  Yes     Tommy Medal 08/03/2019, 1:59 PM

## 2019-08-03 NOTE — Progress Notes (Signed)
   08/03/19 0720  Assess: MEWS Score  ECG Heart Rate (!) 114 Clarene Critchley RN notified)  Assess: MEWS Score  MEWS Temp 0  MEWS Systolic 0  MEWS Pulse 2  MEWS RR 0  MEWS LOC 0  MEWS Score 2  MEWS Score Color Yellow  Assess: if the MEWS score is Yellow or Red  Were vital signs taken at a resting state? Yes  Focused Assessment Documented focused assessment  Early Detection of Sepsis Score *See Row Information* Low  MEWS guidelines implemented *See Row Information* Yes  Treat  MEWS Interventions Other (Comment) (MD notified)  Take Vital Signs  Increase Vital Sign Frequency  Yellow: Q 2hr X 2 then Q 4hr X 2, if remains yellow, continue Q 4hrs  Escalate  MEWS: Escalate Yellow: discuss with charge nurse/RN and consider discussing with provider and RRT  Notify: Charge Nurse/RN  Name of Charge Nurse/RN Notified Evalyn Casco RN  Date Charge Nurse/RN Notified 08/03/19  Time Charge Nurse/RN Notified [id number]  Notify: Provider  Provider Name/Title DR.Manuella Ghazi  Date Provider Notified 08/03/19  Time Provider Notified [phone]  Notification Type Page  Notification Reason Other (Comment) (elevated heart rate )  Response No new orders (at this time)  patients heart rate elevated set of vitals obtained. MD made aware of heart rate. No new orders at this time

## 2019-08-03 NOTE — Progress Notes (Signed)
[facility]  Still requiring some O2. Using IS. Has had binder off some. No major complaints. Eating some. UOP improving.  BP 131/79 (BP Location: Left Arm)   Pulse (!) 107   Temp 98.9 F (37.2 C) (Oral)   Resp 20   Ht 6\' 1"  (1.854 m)   Wt 115 kg   SpO2 94%   BMI 33.45 kg/m  NAD Distended, appropriately tender, port site with staples and no erythema or drainage, midline with honeycomb from hernia repair staples C/d/i  Patient s/p Lap chole and umbilical hernia repair. Doing fair.  Diet as tolerated. IS, OOB AKI improving.   Will need staples out next week. Will send office a message as I will be out of town. Will aim for 6/24.   Curlene Labrum, MD [facility] [address] [place], [place] [address] [phone] (office)

## 2019-08-04 DIAGNOSIS — R410 Disorientation, unspecified: Secondary | ICD-10-CM

## 2019-08-04 DIAGNOSIS — N179 Acute kidney failure, unspecified: Secondary | ICD-10-CM

## 2019-08-04 DIAGNOSIS — N189 Chronic kidney disease, unspecified: Secondary | ICD-10-CM

## 2019-08-04 DIAGNOSIS — R0600 Dyspnea, unspecified: Secondary | ICD-10-CM

## 2019-08-04 LAB — CBC
HCT: 35.7 % — ABNORMAL LOW (ref 39.0–52.0)
Hemoglobin: 11.4 g/dL — ABNORMAL LOW (ref 13.0–17.0)
MCH: 29 pg (ref 26.0–34.0)
MCHC: 31.9 g/dL (ref 30.0–36.0)
MCV: 90.8 fL (ref 80.0–100.0)
Platelets: 277 10*3/uL (ref 150–400)
RBC: 3.93 MIL/uL — ABNORMAL LOW (ref 4.22–5.81)
RDW: 14.3 % (ref 11.5–15.5)
WBC: 8.5 10*3/uL (ref 4.0–10.5)
nRBC: 0 % (ref 0.0–0.2)

## 2019-08-04 LAB — RENAL FUNCTION PANEL
Albumin: 3 g/dL — ABNORMAL LOW (ref 3.5–5.0)
Anion gap: 13 (ref 5–15)
BUN: 37 mg/dL — ABNORMAL HIGH (ref 8–23)
CO2: 21 mmol/L — ABNORMAL LOW (ref 22–32)
Calcium: 8.5 mg/dL — ABNORMAL LOW (ref 8.9–10.3)
Chloride: 102 mmol/L (ref 98–111)
Creatinine, Ser: 2.39 mg/dL — ABNORMAL HIGH (ref 0.61–1.24)
GFR calc Af Amer: 31 mL/min — ABNORMAL LOW (ref >60)
GFR calc non Af Amer: 27 mL/min — ABNORMAL LOW (ref >60)
Glucose, Bld: 185 mg/dL — ABNORMAL HIGH (ref 70–99)
Phosphorus: 2.3 mg/dL — ABNORMAL LOW (ref 2.5–4.6)
Potassium: 3.8 mmol/L (ref 3.5–5.1)
Sodium: 136 mmol/L (ref 135–145)

## 2019-08-04 LAB — GLUCOSE, CAPILLARY
Glucose-Capillary: 162 mg/dL — ABNORMAL HIGH (ref 70–99)
Glucose-Capillary: 164 mg/dL — ABNORMAL HIGH (ref 70–99)
Glucose-Capillary: 168 mg/dL — ABNORMAL HIGH (ref 70–99)
Glucose-Capillary: 170 mg/dL — ABNORMAL HIGH (ref 70–99)
Glucose-Capillary: 190 mg/dL — ABNORMAL HIGH (ref 70–99)
Glucose-Capillary: 214 mg/dL — ABNORMAL HIGH (ref 70–99)

## 2019-08-04 LAB — SURGICAL PATHOLOGY

## 2019-08-04 MED ORDER — QUETIAPINE FUMARATE 25 MG PO TABS
12.5000 mg | ORAL_TABLET | Freq: Every day | ORAL | Status: DC
  Administered 2019-08-04 – 2019-08-05 (×2): 12.5 mg via ORAL
  Filled 2019-08-04 (×2): qty 1

## 2019-08-04 MED ORDER — AMLODIPINE BESYLATE 5 MG PO TABS
10.0000 mg | ORAL_TABLET | Freq: Every day | ORAL | Status: DC
  Administered 2019-08-04 – 2019-08-06 (×3): 10 mg via ORAL
  Filled 2019-08-04 (×3): qty 2

## 2019-08-04 NOTE — Plan of Care (Signed)

## 2019-08-04 NOTE — Progress Notes (Signed)
PROGRESS NOTE    Gabriel JAMAR  Schultz:[id number] DOB: Oct 06, 1950 DOA: 07/31/2019 PCP: Cory Munch, PA-C   Brief Narrative:  Per HPI: Gabriel Fontan Mooreis a 69 y.o.malewith medical history significant forCAD with prior MI in 2003, prior tobacco abuse, hypertension, dyslipidemia, chronic gout, type 2 diabetes, and GERD who presented to the ED initially on 6/10 with complaints of some epigastric and chest pain. He was noted to have some radiation of pain to the center of his back and had some nausea and vomiting. He was sent home after he was ruled out for acute coronary syndrome.He presented back today after he woke up with severe right upper quadrant abdominal pain with some dry heaving. There appears to be no exacerbating or alleviating factors and no radiation of the pain. He denies any fevers, chills, diarrhea, constipation, shortness of breath, or chest pain.  -Patient was admitted with acute cholecystitis and is status post laparoscopic cholecystectomy with removal of gangrenous gallbladder as well as umbilical hernia repair on 6/11.  He continues to have persistent hypoxemia, but is being weaned from his oxygen requirement and is down to 3 L currently.  He was noted to have some agitation and hallucinations last night requiring some Haldol.  AKI is improving and he is tolerating diet, therefore we will avoid further IV fluid for now.  Continue to monitor a.m. labs.  -Continue advancing diet as tolerated; low-dose Seroquel, constant reorientation and follow clinical response.  Repeat basic metabolic panel in a.m.  Continue to wean oxygen supplementation as tolerated.  Hopefully home in the next 1 to 2 days.  Assessment & Plan:   Principal Problem:   Acute gangrenous cholecystitis Active Problems:   Umbilical hernia without obstruction and without gangrene   Acutegangrenous cholecystitis with incarcerated umbilical hernia -Status post laparoscopic cholecystectomy and  primary umbilical hernia repair on 6/11 -Advanced to carb modified diet and is tolerating diet well -Rocephin discontinued on 6/13 -Pain management with IV Dilaudid as needed -Continue IV Zofran as needed  Acute hypoxemic respiratory failure-improving -Related to postoperative atelectasis, noted to be severe and bilateral on chest x-ray overnight -Continue requiring 3 L of oxygen supplementation. -Discussed with RN the need to ambulate patient and use incentive spirometry -Up in chair  AKI on CKD stage IIIb -Likely with some ischemic ATN with some perioperative hypotension -Urine output only recorded as 1000 mL overnight -Creatinine noted to be 2.39 today with baseline noted to be near 1.7-1.9 -Continue monitor strict I's and O's and daily weights -Hold further IV fluid and resume if poor p.o. intake or worsening nausea or vomiting noted. -Urine electrolytes evaluated with FENa 0.3% suggesting prerenal etiology -Renal ultrasound without any acute findings -Appreciate ongoing nephrology evaluation and management  History of CAD with prior MI in 2003/hypertension/dyslipidemia -Plan to hold blood pressure medications and statin for now  History of chronic gout -Resume home allopurinol  History of type 2 diabetes with noted hyperglycemia-improved -continue sliding scale insulin. -Continue holding oral hypoglycemic agents while inpatient (Metformin and glipizide) -Hemoglobin A1c 7.4%  History of GERD -IV PPI daily  Obesity -BMI 33.45 kg/m -Low calorie diet, portion control and increase physical activity discussed with patient.  Delirium -Likely secondary to hospitalization -start low dose seroquel -continue constant re-orientation -follow clinical improvement.    DVT prophylaxis:Heparin Code Status:Full code Family Communication: Dno family at bedside. Disposition Plan: Antibiotics completed. Appreciate ongoing nephrology involvement for AKI work-up and  treatment. Continue to Wean oxygen as tolerated.  Status is: Inpatient  Remains inpatient appropriate because:IV treatments appropriate due to intensity of illness or inability to take PO and Inpatient level of care appropriate due to severity of illness   Dispo: The patient is from:Home Anticipated d/c is NU:UVOZ Anticipated d/c date is: 1-2 days Patient currently is not medically stable to d/c. He continues to have oxygen requirement and is delirious. Renal function improving.   Consultants:  General surgery-Dr. Constance Haw  Nephrology  Procedures:  Status post laparoscopic cholecystectomy and umbilical hernia repair 3/66  Antimicrobials:  Anti-infectives (From admission, onward)   Start     Dose/Rate Route Frequency Ordered Stop   08/01/19 1100  cefTRIAXone (ROCEPHIN) 2 g in sodium chloride 0.9 % 100 mL IVPB  Status:  Discontinued        2 g 200 mL/hr over 30 Minutes Intravenous Every 24 hours 07/31/19 1251 07/31/19 1831   08/01/19 1100  cefTRIAXone (ROCEPHIN) 2 g in sodium chloride 0.9 % 100 mL IVPB        2 g 200 mL/hr over 30 Minutes Intravenous Every 24 hours 07/31/19 1831 08/02/19 1036   08/01/19 0600  cefoTEtan (CEFOTAN) 2 g in sodium chloride 0.9 % 100 mL IVPB        2 g 200 mL/hr over 30 Minutes Intravenous On call to O.R. 07/31/19 1316 08/01/19 0916   07/31/19 1100  cefTRIAXone (ROCEPHIN) 2 g in sodium chloride 0.9 % 100 mL IVPB        2 g 200 mL/hr over 30 Minutes Intravenous  Once 07/31/19 1057 07/31/19 1153      Subjective: Confused/delirious.  Oriented x1 only.  No fever, no chest pain, no nausea or vomiting.  Increased urine output has been recorded.   Objective: Vitals:   08/03/19 1516 08/03/19 2015 08/03/19 2020 08/04/19 0431  BP: (!) 156/94 127/67  (!) 173/86  Pulse: 91 92  99  Resp: 20     Temp: 99.9 F (37.7 C) 99.3 F (37.4 C)  98.3 F (36.8 C)  TempSrc: Oral Oral  Oral  SpO2: 94% 98% 98% 97%   Weight:      Height:        Intake/Output Summary (Last 24 hours) at 08/04/2019 1318 Last data filed at 08/04/2019 0900 Gross per 24 hour  Intake 1700 ml  Output 1650 ml  Net 50 ml   Filed Weights   07/31/19 0942  Weight: 115 kg    Examination: General exam: Confused, mainly oriented to person only.  No fever, no chest pain, no nausea or vomiting.  Increased urine output reported.  Still requiring 3 L nasal cannula supplementation. Respiratory system: Good air movement bilaterally, no using accessory muscles. Cardiovascular system:RRR. No murmurs, rubs, gallops. Gastrointestinal system: Abdomen is nondistended, soft and expectedly tender to palpation. No organomegaly or masses felt. Positive BS. Central nervous system: No focal neurological deficits. Extremities: No cyanosis or clubbing. Skin: No rash Psychiatry: Mood & affect appears to be appropriate.    General exam: Appears calm and comfortable  Respiratory system: Clear to auscultation. Respiratory effort normal.  Currently on 3 L nasal cannula oxygen. Cardiovascular system: S1 & S2 heard, RRR. No JVD, murmurs, rubs, gallops or clicks. No pedal edema. Gastrointestinal system: Abdomen with incisions clean dry and intact.  Continues to have some distention. Central nervous system: Alert and awake Extremities: Symmetric 5 x 5 power. Skin: No rashes, lesions or ulcers Psychiatry: Difficult to assess    Data Reviewed: I have personally reviewed following labs and imaging studies  CBC: Recent  Labs  Lab 07/31/19 0950 08/01/19 0643 08/02/19 0852 08/03/19 0604 08/04/19 0556  WBC 12.1* 9.7 10.5 9.4 8.5  NEUTROABS 8.5*  --   --   --   --   HGB 13.5 12.0* 11.5* 11.3* 11.4*  HCT 41.1 37.2* 35.9* 34.9* 35.7*  MCV 89.3 90.3 90.4 90.6 90.8  PLT 235 207 215 216 098   Basic Metabolic Panel: Recent Labs  Lab 07/31/19 0950 08/01/19 0643 08/02/19 0852 08/03/19 0604 08/04/19 0556  NA 135 136 130* 132* 136  K 4.4 4.7 4.4  3.9 3.8  CL 99 101 99 100 102  CO2 21* 20* 19* 17* 21*  GLUCOSE 210* 147* 214* 207* 185*  BUN 19 34* 42* 38* 37*  CREATININE 2.05* 3.75* 3.61* 2.99* 2.39*  CALCIUM 9.0 8.1* 7.8* 8.2* 8.5*  MG  --  1.8  --   --   --   PHOS  --   --   --   --  2.3*   GFR: Estimated Creatinine Clearance: 38.7 mL/min (A) (by C-G formula based on SCr of 2.39 mg/dL (H)).   Liver Function Tests: Recent Labs  Lab 07/30/19 1535 07/30/19 1535 07/31/19 0950 08/01/19 0643 08/02/19 0852 08/03/19 0604 08/04/19 0556  AST 19  --  14* 81* 45* 26  --   ALT 17  --  16 60* 50* 39  --   ALKPHOS 52  --  52 44 49 51  --   BILITOT 1.0  --  1.4* 0.9 0.9 0.7  --   PROT 7.5  --  7.7 6.8 7.1 7.1  --   ALBUMIN 3.8   < > 3.7 3.4* 3.1* 3.0* 3.0*   < > = values in this interval not displayed.   Recent Labs  Lab 07/30/19 1535 07/31/19 0950  LIPASE 25 20   Cardiac Enzymes: Recent Labs  Lab 08/02/19 0852  CKTOTAL 403*    CBG: Recent Labs  Lab 08/03/19 2016 08/04/19 0025 08/04/19 0432 08/04/19 0720 08/04/19 1058  GLUCAP 180* 168* 170* 164* 190*    Recent Results (from the past 240 hour(s))  SARS Coronavirus 2 by RT PCR (hospital order, performed in [facility] hospital lab) Nasopharyngeal Nasopharyngeal Swab     Status: None   Collection Time: 07/31/19 12:09 PM   Specimen: Nasopharyngeal Swab  Result Value Ref Range Status   SARS Coronavirus 2 NEGATIVE NEGATIVE Final    Comment: (NOTE) SARS-CoV-2 target nucleic acids are NOT DETECTED.  The SARS-CoV-2 RNA is generally detectable in upper and lower respiratory specimens during the acute phase of infection. The lowest concentration of SARS-CoV-2 viral copies this assay can detect is 250 copies / mL. A negative result does not preclude SARS-CoV-2 infection and should not be used as the sole basis for treatment or other patient management decisions.  A negative result may occur with improper specimen collection / handling, submission of specimen  other than nasopharyngeal swab, presence of viral mutation(s) within the areas targeted by this assay, and inadequate number of viral copies (<250 copies / mL). A negative result must be combined with clinical observations, patient history, and epidemiological information.  Fact Sheet for Patients:   StrictlyIdeas.no  Fact Sheet for Healthcare Providers: BankingDealers.co.za  This test is not yet approved or  cleared by the [place] FDA and has been authorized for detection and/or diagnosis of SARS-CoV-2 by FDA under an Emergency Use Authorization (EUA).  This EUA will remain in effect (meaning this test can be used) for the  duration of the COVID-19 declaration under Section 564(b)(1) of the Act, 21 U.S.C. section 360bbb-3(b)(1), unless the authorization is terminated or revoked sooner.  Performed at [facility], [address], [place], [place] [address]      Radiology Studies: No results found.   Scheduled Meds: . amLODipine  10 mg Oral Daily  . guaiFENesin  1,200 mg Oral BID  . heparin  5,000 Units Subcutaneous Q8H  . insulin aspart  0-15 Units Subcutaneous Q4H  . LORazepam  2 mg Intramuscular Once  . metoprolol succinate  50 mg Oral Daily  . pantoprazole (PROTONIX) IV  40 mg Intravenous Q24H    LOS: 4 days    Time spent: 30 minutes    Barton Dubois, MD [facility]  If 7PM-7AM, please contact night-coverage [email or web address] 08/04/2019, 1:18 PM

## 2019-08-04 NOTE — Progress Notes (Signed)
Patient ID: Gabriel Schultz, male   DOB: Jul 10, 1950, 69 y.o.   MRN: [id number] S: Confused this am.  Didn't know he was in the hospital.  His wife reports that he has been perseverating on family members. O:BP (!) 173/86 (BP Location: Left Arm)   Pulse 99   Temp 98.3 F (36.8 C) (Oral)   Resp 20   Ht 6\' 1"  (1.854 m)   Wt 115 kg   SpO2 97%   BMI 33.45 kg/m   Intake/Output Summary (Last 24 hours) at 08/04/2019 1114 Last data filed at 08/04/2019 0900 Gross per 24 hour  Intake 1820 ml  Output 1650 ml  Net 170 ml   Intake/Output: I/O last 3 completed shifts: In: 1820 [P.O.:1820] Out: 7564 [Urine:1650]  Intake/Output this shift:  Total I/O In: 240 [P.O.:240] Out: -  Weight change:  Gen: NAD CVS: RRR, no rub Resp: cta Abd: +BS, soft, mildly tender Ext: no edema  Recent Labs  Lab 07/30/19 1535 07/31/19 0950 08/01/19 0643 08/02/19 0852 08/03/19 0604 08/04/19 0556  NA 133* 135 136 130* 132* 136  K 4.3 4.4 4.7 4.4 3.9 3.8  CL 102 99 101 99 100 102  CO2 21* 21* 20* 19* 17* 21*  GLUCOSE 227* 210* 147* 214* 207* 185*  BUN 19 19 34* 42* 38* 37*  CREATININE 1.86* 2.05* 3.75* 3.61* 2.99* 2.39*  ALBUMIN 3.8 3.7 3.4* 3.1* 3.0* 3.0*  CALCIUM 8.9 9.0 8.1* 7.8* 8.2* 8.5*  PHOS  --   --   --   --   --  2.3*  AST 19 14* 81* 45* 26  --   ALT 17 16 60* 50* 39  --    Liver Function Tests: Recent Labs  Lab 08/01/19 0643 08/01/19 0643 08/02/19 0852 08/03/19 0604 08/04/19 0556  AST 81*  --  45* 26  --   ALT 60*  --  50* 39  --   ALKPHOS 44  --  49 51  --   BILITOT 0.9  --  0.9 0.7  --   PROT 6.8  --  7.1 7.1  --   ALBUMIN 3.4*   < > 3.1* 3.0* 3.0*   < > = values in this interval not displayed.   Recent Labs  Lab 07/30/19 1535 07/31/19 0950  LIPASE 25 20   No results for input(s): AMMONIA in the last 168 hours. CBC: Recent Labs  Lab 07/31/19 0950 07/31/19 0950 08/01/19 0643 08/01/19 0643 08/02/19 0852 08/03/19 0604 08/04/19 0556  WBC 12.1*   < > 9.7   < > 10.5  9.4 8.5  NEUTROABS 8.5*  --   --   --   --   --   --   HGB 13.5   < > 12.0*   < > 11.5* 11.3* 11.4*  HCT 41.1   < > 37.2*   < > 35.9* 34.9* 35.7*  MCV 89.3  --  90.3  --  90.4 90.6 90.8  PLT 235   < > 207   < > 215 216 277   < > = values in this interval not displayed.   Cardiac Enzymes: Recent Labs  Lab 08/02/19 0852  CKTOTAL 403*   CBG: Recent Labs  Lab 08/03/19 2016 08/04/19 0025 08/04/19 0432 08/04/19 0720 08/04/19 1058  GLUCAP 180* 168* 170* 164* 190*    Iron Studies: No results for input(s): IRON, TIBC, TRANSFERRIN, FERRITIN in the last 72 hours. Studies/Results: No results found. [name] guaiFENesin  1,200 mg Oral  BID  . heparin  5,000 Units Subcutaneous Q8H  . insulin aspart  0-15 Units Subcutaneous Q4H  . LORazepam  2 mg Intramuscular Once  . metoprolol succinate  50 mg Oral Daily  . pantoprazole (PROTONIX) IV  40 mg Intravenous Q24H    BMET    Component Value Date/Time   NA 136 08/04/2019 0556   K 3.8 08/04/2019 0556   CL 102 08/04/2019 0556   CO2 21 (L) 08/04/2019 0556   GLUCOSE 185 (H) 08/04/2019 0556   BUN 37 (H) 08/04/2019 0556   CREATININE 2.39 (H) 08/04/2019 0556   CALCIUM 8.5 (L) 08/04/2019 0556   GFRNONAA 27 (L) 08/04/2019 0556   GFRAA 31 (L) 08/04/2019 0556   CBC    Component Value Date/Time   WBC 8.5 08/04/2019 0556   RBC 3.93 (L) 08/04/2019 0556   HGB 11.4 (L) 08/04/2019 0556   HCT 35.7 (L) 08/04/2019 0556   PLT 277 08/04/2019 0556   MCV 90.8 08/04/2019 0556   MCH 29.0 08/04/2019 0556   MCHC 31.9 08/04/2019 0556   RDW 14.3 08/04/2019 0556   LYMPHSABS 2.3 07/31/2019 0950   MONOABS 1.3 (H) 07/31/2019 0950   EOSABS 0.0 07/31/2019 0950   BASOSABS 0.0 07/31/2019 0950    Assessment/Plan:  1. AKI/CKD stage IIIb, non-oliguric- presumably due to ischemic ATN in setting of hypotension and sepsis.  Increased UOP and improvement of Scr for the past 2 days closer to baseline of 1.9.  Continue to follow for now.  No indication for dialysis.    1. Continue to hold metformin 2. Avoid all NSAIDs and COX-II I's  2. Acute gangrenous cholecystitis with incarcerated umbilical hernia- s/p lap chole and primary umbilical hernia repair on 6/11.  Completed IV rocephin,   3. CKD stage IIIb- presumably due to DM, HTN, and chronic NSAID use.  Baseline appears to be 1.7-1.9.  Will need follow up after discharge with our office in [place]. 4. HTN- BP's elevated.  Resume metoprolol xl 50 mg daily on 08/03/19 and will resume amlodipine 10 mg today and follow BP's 5. AMS- ?delirium likely from AKI/Sepsis, delayed effects of anesthesia/pain meds.   6. DM type II- metformin stopped due to AKI 7. CAD- h/o MI in 2003.  Stable. 8. Gout- per primary 9. GERD- on PPI 10. Chronic back pain- need an alternative to NSAIDs.   Donetta Potts, MD [employer] [phone]

## 2019-08-04 NOTE — Progress Notes (Signed)
[facility]  Improving but still disoriented and on O2. Staples c/d/ I without erythema or drainage, umbilical staple line with some irritation to the left side but not cellulitis   Will need staples out next week 6/24. Dr. Arnoldo Morale will see as I am on vacation. Diet as tolerated PRN for pain   Curlene Labrum, MD [facility] [address] [place],  [address] [phone] (office)

## 2019-08-05 LAB — RENAL FUNCTION PANEL
Albumin: 2.8 g/dL — ABNORMAL LOW (ref 3.5–5.0)
Anion gap: 13 (ref 5–15)
BUN: 36 mg/dL — ABNORMAL HIGH (ref 8–23)
CO2: 21 mmol/L — ABNORMAL LOW (ref 22–32)
Calcium: 8.7 mg/dL — ABNORMAL LOW (ref 8.9–10.3)
Chloride: 105 mmol/L (ref 98–111)
Creatinine, Ser: 2.16 mg/dL — ABNORMAL HIGH (ref 0.61–1.24)
GFR calc Af Amer: 35 mL/min — ABNORMAL LOW (ref >60)
GFR calc non Af Amer: 30 mL/min — ABNORMAL LOW (ref >60)
Glucose, Bld: 170 mg/dL — ABNORMAL HIGH (ref 70–99)
Phosphorus: 3.1 mg/dL (ref 2.5–4.6)
Potassium: 4 mmol/L (ref 3.5–5.1)
Sodium: 139 mmol/L (ref 135–145)

## 2019-08-05 LAB — GLUCOSE, CAPILLARY
Glucose-Capillary: 126 mg/dL — ABNORMAL HIGH (ref 70–99)
Glucose-Capillary: 142 mg/dL — ABNORMAL HIGH (ref 70–99)
Glucose-Capillary: 170 mg/dL — ABNORMAL HIGH (ref 70–99)
Glucose-Capillary: 180 mg/dL — ABNORMAL HIGH (ref 70–99)
Glucose-Capillary: 194 mg/dL — ABNORMAL HIGH (ref 70–99)
Glucose-Capillary: 220 mg/dL — ABNORMAL HIGH (ref 70–99)

## 2019-08-05 LAB — CBC
HCT: 34.9 % — ABNORMAL LOW (ref 39.0–52.0)
Hemoglobin: 11.3 g/dL — ABNORMAL LOW (ref 13.0–17.0)
MCH: 29 pg (ref 26.0–34.0)
MCHC: 32.4 g/dL (ref 30.0–36.0)
MCV: 89.7 fL (ref 80.0–100.0)
Platelets: 275 10*3/uL (ref 150–400)
RBC: 3.89 MIL/uL — ABNORMAL LOW (ref 4.22–5.81)
RDW: 14.6 % (ref 11.5–15.5)
WBC: 6.7 10*3/uL (ref 4.0–10.5)
nRBC: 0 % (ref 0.0–0.2)

## 2019-08-05 MED ORDER — PANTOPRAZOLE SODIUM 40 MG PO TBEC
40.0000 mg | DELAYED_RELEASE_TABLET | Freq: Every day | ORAL | Status: DC
  Administered 2019-08-06: 40 mg via ORAL
  Filled 2019-08-05: qty 1

## 2019-08-05 MED ORDER — SODIUM CHLORIDE 0.9 % IV SOLN: INTRAVENOUS | Status: AC

## 2019-08-05 NOTE — Care Management Important Message (Signed)
Important Message  Patient Details  Name: Gabriel Schultz MRN: [id number] Date of Birth: 1950-03-25   Medicare Important Message Given:  Yes     Tommy Medal 08/05/2019, 10:12 AM

## 2019-08-05 NOTE — Progress Notes (Signed)
Patient up to chair by nursing tech. Patient family refused chair alarm stating that she is not leaving and would let staff know when she leaves. Patient family educated on chair alarm use and still refused.

## 2019-08-05 NOTE — Progress Notes (Signed)
PROGRESS NOTE    Gabriel Schultz  CBS:[id number] DOB: [birth year] DOA: 07/31/2019 PCP: Cory Munch, PA-C   Brief Narrative:  Per HPI: Gabriel Schultz a 69 y.o.malewith medical history significant forCAD with prior MI in 2003, prior tobacco abuse, hypertension, dyslipidemia, chronic gout, type 2 diabetes, and GERD who presented to the ED initially on 6/10 with complaints of some epigastric and chest pain. He was noted to have some radiation of pain to the center of his back and had some nausea and vomiting. He was sent home after he was ruled out for acute coronary syndrome.He presented back today after he woke up with severe right upper quadrant abdominal pain with some dry heaving. There appears to be no exacerbating or alleviating factors and no radiation of the pain. He denies any fevers, chills, diarrhea, constipation, shortness of breath, or chest pain.  -Patient was admitted with acute cholecystitis and is status post laparoscopic cholecystectomy with removal of gangrenous gallbladder as well as umbilical hernia repair on 6/11.He continues to have persistent hypoxemia, but is being weaned from his oxygen requirement and is down to 3 L currently.  He was noted to have some agitation and hallucinations last night requiring some Haldol.  He was started on Seroquel with some improvement in his agitation.  He appears to be tolerating his diet.  Plan is to continue to wean oxygen through today and ambulate further.  Continue IV fluid as recommended by nephrology and follow-up outpatient once discharged.  Assessment & Plan:   Principal Problem:   Acute gangrenous cholecystitis Active Problems:   Umbilical hernia without obstruction and without gangrene   Acutegangrenous cholecystitis with incarcerated umbilical hernia -Status post laparoscopic cholecystectomy and primary umbilical hernia repair on 6/11 -Advancedto carb modified diet and is tolerating diet well -Rocephin  discontinued on 6/13 -Pain management with IV Dilaudid as needed -Continue IV Zofran as needed  Acute hypoxemic respiratory failure-improving -Related to postoperative atelectasis,noted to be severe and bilateral on chest x-ray overnight -Continues to require 3 L of oxygen supplementation which will need to be weaned further today -Discussed with RN the need to ambulate patient and use incentive spirometry -Up in chair  AKI on CKD stage IIIb -Likely with some ischemic ATN with some perioperative hypotension -Urine output only recorded as1000 mL overnight -Creatinine noted to be 2.16today with baseline noted to be near 1.7-1.9 -Continue monitor strict I's and O's and daily weights -Continue time-limited IV fluid for 12 hours as recommended by nephrology -Urine electrolytes evaluated with FENa 0.3% suggesting prerenal etiology -Renal ultrasound without any acute findings -Can be discharged from renal standpoint soon and followed up with [facility] -Plan to hold Metformin on discharge  History of CAD with prior MI in 2003/hypertension/dyslipidemia -Plan to hold blood pressure medications and statin for now  History of chronic gout -Resume home allopurinol  History of type 2 diabetes with noted hyperglycemia-improved -continue sliding scale insulin. -Continue holding oral hypoglycemic agents while inpatient and likely will not resume on discharge. -Hemoglobin A1c 7.4%  History of GERD -IV PPI to oral today  Obesity -BMI 33.45 kg/m -Low calorie diet, portion control and increase physical activity discussed with patient.  Delirium-improved -Likely secondary to hospitalization -Low-dose Seroquel has helped -continue constant re-orientation -follow clinical improvement.    DVT prophylaxis:Heparin Code Status:Full code Family Communication:Wife at bedside, discussed case on 6/16 Disposition Plan:Antibiotics completed. Appreciate ongoing  nephrology involvement for AKI work-up and treatment. Continue to Wean oxygen as tolerated.  Status is:  Inpatient  Remains inpatient appropriate because:IV treatments appropriate due to intensity of illness or inability to take PO and Inpatient level of care appropriate due to severity of illness   Dispo: The patient is from:Home Anticipated d/c is HU:TMLY Anticipated d/c date is:1-2days Patient currently is not medically stable to d/c.He continues to have oxygen requirement and is delirious, but improving. Renal function improving.   Consultants:  General surgery-Dr. Constance Haw  Nephrology  Procedures:  Status post laparoscopic cholecystectomy and umbilical hernia repair 6/50  Antimicrobials:  Anti-infectives (From admission, onward)   Start     Dose/Rate Route Frequency Ordered Stop   08/01/19 1100  cefTRIAXone (ROCEPHIN) 2 g in sodium chloride 0.9 % 100 mL IVPB  Status:  Discontinued        2 g 200 mL/hr over 30 Minutes Intravenous Every 24 hours 07/31/19 1251 07/31/19 1831   08/01/19 1100  cefTRIAXone (ROCEPHIN) 2 g in sodium chloride 0.9 % 100 mL IVPB        2 g 200 mL/hr over 30 Minutes Intravenous Every 24 hours 07/31/19 1831 08/02/19 1036   08/01/19 0600  cefoTEtan (CEFOTAN) 2 g in sodium chloride 0.9 % 100 mL IVPB        2 g 200 mL/hr over 30 Minutes Intravenous On call to O.R. 07/31/19 1316 08/01/19 0916   07/31/19 1100  cefTRIAXone (ROCEPHIN) 2 g in sodium chloride 0.9 % 100 mL IVPB        2 g 200 mL/hr over 30 Minutes Intravenous  Once 07/31/19 1057 07/31/19 1153       Subjective: Patient seen and evaluated today with no new acute complaints or concerns.  He rested well overnight with the Seroquel.  He is quite somnolent this morning.  Wife is at bedside.  Objective: Vitals:   08/04/19 1356 08/04/19 2010 08/04/19 2116 08/05/19 0408  BP: (!) 120/55 (!) 157/82  132/73  Pulse: 84 89  92  Resp: 19     Temp:  98.1 F (36.7 C) 99.5 F (37.5 C)  98.2 F (36.8 C)  TempSrc:  Oral    SpO2: 96% 97% 97% 93%  Weight:      Height:        Intake/Output Summary (Last 24 hours) at 08/05/2019 1206 Last data filed at 08/05/2019 0730 Gross per 24 hour  Intake 480 ml  Output 2250 ml  Net -1770 ml   Filed Weights   07/31/19 0942  Weight: 115 kg    Examination:  General exam: Appears calm and comfortable, somnolent Respiratory system: Clear to auscultation. Respiratory effort normal.  Continues on 3 L nasal cannula supplementation. Cardiovascular system: S1 & S2 heard, RRR. No JVD, murmurs, rubs, gallops or clicks. No pedal edema. Gastrointestinal system: Abdomen is nondistended, soft and nontender. No organomegaly or masses felt. Normal bowel sounds heard. Central nervous system: Somnolent Extremities: No edema Skin: No rashes, lesions or ulcers Psychiatry: Cannot be assessed.    Data Reviewed: I have personally reviewed following labs and imaging studies  CBC: Recent Labs  Lab 07/31/19 0950 07/31/19 0950 08/01/19 0643 08/02/19 0852 08/03/19 0604 08/04/19 0556 08/05/19 0542  WBC 12.1*   < > 9.7 10.5 9.4 8.5 6.7  NEUTROABS 8.5*  --   --   --   --   --   --   HGB 13.5   < > 12.0* 11.5* 11.3* 11.4* 11.3*  HCT 41.1   < > 37.2* 35.9* 34.9* 35.7* 34.9*  MCV 89.3   < > 90.3  90.4 90.6 90.8 89.7  PLT 235   < > 207 215 216 277 275   < > = values in this interval not displayed.   Basic Metabolic Panel: Recent Labs  Lab 08/01/19 0643 08/02/19 0852 08/03/19 0604 08/04/19 0556 08/05/19 0541  NA 136 130* 132* 136 139  K 4.7 4.4 3.9 3.8 4.0  CL 101 99 100 102 105  CO2 20* 19* 17* 21* 21*  GLUCOSE 147* 214* 207* 185* 170*  BUN 34* 42* 38* 37* 36*  CREATININE 3.75* 3.61* 2.99* 2.39* 2.16*  CALCIUM 8.1* 7.8* 8.2* 8.5* 8.7*  MG 1.8  --   --   --   --   PHOS  --   --   --  2.3* 3.1   GFR: Estimated Creatinine Clearance: 42.9 mL/min (A) (by C-G formula based on SCr of 2.16 mg/dL  (H)). Liver Function Tests: Recent Labs  Lab 07/30/19 1535 07/30/19 1535 07/31/19 0950 07/31/19 0950 08/01/19 0643 08/02/19 0852 08/03/19 0604 08/04/19 0556 08/05/19 0541  AST 19  --  14*  --  81* 45* 26  --   --   ALT 17  --  16  --  60* 50* 39  --   --   ALKPHOS 52  --  52  --  44 49 51  --   --   BILITOT 1.0  --  1.4*  --  0.9 0.9 0.7  --   --   PROT 7.5  --  7.7  --  6.8 7.1 7.1  --   --   ALBUMIN 3.8   < > 3.7   < > 3.4* 3.1* 3.0* 3.0* 2.8*   < > = values in this interval not displayed.   Recent Labs  Lab 07/30/19 1535 07/31/19 0950  LIPASE 25 20   No results for input(s): AMMONIA in the last 168 hours. Coagulation Profile: No results for input(s): INR, PROTIME in the last 168 hours. Cardiac Enzymes: Recent Labs  Lab 08/02/19 0852  CKTOTAL 403*   BNP (last 3 results) No results for input(s): PROBNP in the last 8760 hours. HbA1C: No results for input(s): HGBA1C in the last 72 hours. CBG: Recent Labs  Lab 08/04/19 2011 08/05/19 0001 08/05/19 0410 08/05/19 0757 08/05/19 1119  GLUCAP 214* 126* 142* 170* 220*   Lipid Profile: No results for input(s): CHOL, HDL, LDLCALC, TRIG, CHOLHDL, LDLDIRECT in the last 72 hours. Thyroid Function Tests: No results for input(s): TSH, T4TOTAL, FREET4, T3FREE, THYROIDAB in the last 72 hours. Anemia Panel: No results for input(s): VITAMINB12, FOLATE, FERRITIN, TIBC, IRON, RETICCTPCT in the last 72 hours. Sepsis Labs: No results for input(s): PROCALCITON, LATICACIDVEN in the last 168 hours.  Recent Results (from the past 240 hour(s))  SARS Coronavirus 2 by RT PCR (hospital order, performed in [facility] hospital lab) Nasopharyngeal Nasopharyngeal Swab     Status: None   Collection Time: 07/31/19 12:09 PM   Specimen: Nasopharyngeal Swab  Result Value Ref Range Status   SARS Coronavirus 2 NEGATIVE NEGATIVE Final    Comment: (NOTE) SARS-CoV-2 target nucleic acids are NOT DETECTED.  The SARS-CoV-2 RNA is generally  detectable in upper and lower respiratory specimens during the acute phase of infection. The lowest concentration of SARS-CoV-2 viral copies this assay can detect is 250 copies / mL. A negative result does not preclude SARS-CoV-2 infection and should not be used as the sole basis for treatment or other patient management decisions.  A negative result may occur with  improper specimen collection / handling, submission of specimen other than nasopharyngeal swab, presence of viral mutation(s) within the areas targeted by this assay, and inadequate number of viral copies (<250 copies / mL). A negative result must be combined with clinical observations, patient history, and epidemiological information.  Fact Sheet for Patients:   StrictlyIdeas.no  Fact Sheet for Healthcare Providers: BankingDealers.co.za  This test is not yet approved or  cleared by the [place] FDA and has been authorized for detection and/or diagnosis of SARS-CoV-2 by FDA under an Emergency Use Authorization (EUA).  This EUA will remain in effect (meaning this test can be used) for the duration of the COVID-19 declaration under Section 564(b)(1) of the Act, 21 U.S.C. section 360bbb-3(b)(1), unless the authorization is terminated or revoked sooner.  Performed at [facility], [address] [place], [place] [address]          Radiology Studies: No results found.      Scheduled Meds: . amLODipine  10 mg Oral Daily  . guaiFENesin  1,200 mg Oral BID  . heparin  5,000 Units Subcutaneous Q8H  . insulin aspart  0-15 Units Subcutaneous Q4H  . LORazepam  2 mg Intramuscular Once  . metoprolol succinate  50 mg Oral Daily  . pantoprazole (PROTONIX) IV  40 mg Intravenous Q24H  . QUEtiapine  12.5 mg Oral QHS   Continuous Infusions: . sodium chloride 75 mL/hr at 08/05/19 1121     LOS: 5 days    Time spent: 30 minutes    Nam Vossler Darleen Crocker, DO [facility]  If 7PM-7AM, please contact night-coverage [email or web address] 08/05/2019, 12:06 PM

## 2019-08-05 NOTE — Plan of Care (Signed)

## 2019-08-05 NOTE — Progress Notes (Signed)
[facility]  Getting less confused and tolerating diet and having Bms. Feels better. Umbilical incision is slightly irritated on the left side but no redness on the right, looks more like swelling and irritation. Warned wife to look at the incision and monitor it for any worsening redness or drainage. No leukocytosis and no fevers currently.  Plan for staple removal next week with Dr. Arnoldo Morale. Office has been notified.    Curlene Labrum, MD [facility] [address] [place], [place] [address] [phone] (office)

## 2019-08-05 NOTE — Progress Notes (Signed)
Nephrology Progress Note:  Patient ID: Gabriel Schultz, male   DOB: [birth year], 69 y.o.   MRN: [id number]    S: hx confusion noted per charting.  Spoke with his wife at bedside.  Per his wife he is getting better but not back to normal.  He had 1.6 liters UOP and one unmeasured void over 6/15. Surgery is here to see.  Review of systems:  Denies n/v - eating ok - hasn't had breakfast; denies abd pain to me Denies shortness of breath or chest pain  Denies difficulty with urination  O:BP 132/73 (BP Location: Right Arm)   Pulse 92   Temp 98.2 F (36.8 C)   Resp 19   Ht 6\' 1"  (1.854 m)   Wt 115 kg   SpO2 93%   BMI 33.45 kg/m   Intake/Output Summary (Last 24 hours) at 08/05/2019 0857 Last data filed at 08/05/2019 0730 Gross per 24 hour  Intake 720 ml  Output 2250 ml  Net -1530 ml   Intake/Output: I/O last 3 completed shifts: In: 1940 [P.O.:1940] Out: 3818 [EXHBZ:1696]  Intake/Output this shift:  Total I/O In: -  Out: 700 [Urine:700] Weight change:    Physical Exam: Gen: NAD CVS: RRR, no rub Resp: cta Abd: softly dist; no tenderness to light palpation  Ext: no edema Neuro - awakens to voice and answers basic questions but needs assistance putting on a mask  Recent Labs  Lab 07/30/19 1535 07/31/19 0950 08/01/19 0643 08/02/19 0852 08/03/19 0604 08/04/19 0556 08/05/19 0541  NA 133* 135 136 130* 132* 136 139  K 4.3 4.4 4.7 4.4 3.9 3.8 4.0  CL 102 99 101 99 100 102 105  CO2 21* 21* 20* 19* 17* 21* 21*  GLUCOSE 227* 210* 147* 214* 207* 185* 170*  BUN 19 19 34* 42* 38* 37* 36*  CREATININE 1.86* 2.05* 3.75* 3.61* 2.99* 2.39* 2.16*  ALBUMIN 3.8 3.7 3.4* 3.1* 3.0* 3.0* 2.8*  CALCIUM 8.9 9.0 8.1* 7.8* 8.2* 8.5* 8.7*  PHOS  --   --   --   --   --  2.3* 3.1  AST 19 14* 81* 45* 26  --   --   ALT 17 16 60* 50* 39  --   --    Liver Function Tests: Recent Labs  Lab 08/01/19 0643 08/01/19 0643 08/02/19 0852 08/02/19 0852 08/03/19 0604 08/04/19 0556 08/05/19 0541   AST 81*  --  45*  --  26  --   --   ALT 60*  --  50*  --  39  --   --   ALKPHOS 44  --  49  --  51  --   --   BILITOT 0.9  --  0.9  --  0.7  --   --   PROT 6.8  --  7.1  --  7.1  --   --   ALBUMIN 3.4*   < > 3.1*   < > 3.0* 3.0* 2.8*   < > = values in this interval not displayed.   Recent Labs  Lab 07/30/19 1535 07/31/19 0950  LIPASE 25 20   CBC: Recent Labs  Lab 07/31/19 0950 07/31/19 0950 08/01/19 0643 08/01/19 0643 08/02/19 0852 08/02/19 0852 08/03/19 0604 08/04/19 0556 08/05/19 0542  WBC 12.1*   < > 9.7   < > 10.5   < > 9.4 8.5 6.7  NEUTROABS 8.5*  --   --   --   --   --   --   --   --  HGB 13.5   < > 12.0*   < > 11.5*   < > 11.3* 11.4* 11.3*  HCT 41.1   < > 37.2*   < > 35.9*   < > 34.9* 35.7* 34.9*  MCV 89.3   < > 90.3  --  90.4  --  90.6 90.8 89.7  PLT 235   < > 207   < > 215   < > 216 277 275   < > = values in this interval not displayed.   Cardiac Enzymes: Recent Labs  Lab 08/02/19 0852  CKTOTAL 403*   CBG: Recent Labs  Lab 08/04/19 1651 08/04/19 2011 08/05/19 0001 08/05/19 0410 08/05/19 0757  GLUCAP 162* 214* 126* 142* 170*    Iron Studies: No results for input(s): IRON, TIBC, TRANSFERRIN, FERRITIN in the last 72 hours. Studies/Results: No results found. [name] amLODipine  10 mg Oral Daily  . guaiFENesin  1,200 mg Oral BID  . heparin  5,000 Units Subcutaneous Q8H  . insulin aspart  0-15 Units Subcutaneous Q4H  . LORazepam  2 mg Intramuscular Once  . metoprolol succinate  50 mg Oral Daily  . pantoprazole (PROTONIX) IV  40 mg Intravenous Q24H  . QUEtiapine  12.5 mg Oral QHS    BMET    Component Value Date/Time   NA 139 08/05/2019 0541   K 4.0 08/05/2019 0541   CL 105 08/05/2019 0541   CO2 21 (L) 08/05/2019 0541   GLUCOSE 170 (H) 08/05/2019 0541   BUN 36 (H) 08/05/2019 0541   CREATININE 2.16 (H) 08/05/2019 0541   CALCIUM 8.7 (L) 08/05/2019 0541   GFRNONAA 30 (L) 08/05/2019 0541   GFRAA 35 (L) 08/05/2019 0541   CBC    Component Value  Date/Time   WBC 6.7 08/05/2019 0542   RBC 3.89 (L) 08/05/2019 0542   HGB 11.3 (L) 08/05/2019 0542   HCT 34.9 (L) 08/05/2019 0542   PLT 275 08/05/2019 0542   MCV 89.7 08/05/2019 0542   MCH 29.0 08/05/2019 0542   MCHC 32.4 08/05/2019 0542   RDW 14.6 08/05/2019 0542   LYMPHSABS 2.3 07/31/2019 0950   MONOABS 1.3 (H) 07/31/2019 0950   EOSABS 0.0 07/31/2019 0950   BASOSABS 0.0 07/31/2019 0950    Assessment/Plan:  1. AKI/CKD stage IIIb, non-oliguric- presumably due to ischemic ATN in setting of hypotension and sepsis.  Increased UOP and improvement of Cr closer to baseline of 1.9.   1. Continue supportive care.  Gentle hydration with NS x 12 hours   1. Hold metformin and would not resume on discharge 2. Avoid all NSAIDs and COX-II I's  2. Acute gangrenous cholecystitis with incarcerated umbilical hernia- s/p lap chole and primary umbilical hernia repair on 6/11.  Completed IV rocephin   3. CKD stage IIIb- presumably due to DM, HTN, and chronic NSAID use.  Baseline appears to be 1.7-1.9.  Will need follow up after discharge with our office in [place] as below. 4. HTN- improved.   5. AMS- ?delirium likely from AKI/Sepsis, delayed effects of anesthesia/pain meds.  Improving  6. DM type II- metformin stopped due to AKI 7. CAD- h/o MI in 2003.  Stable. 8. Gout- per primary 9. GERD- on PPI 10. Chronic back pain-he will need an alternative to NSAIDs.  Disposition - improving from a renal standpoint.  Disposition per primary team.  Will need follow-up in the [place] office after discharge.   Claudia Desanctis, MD 08/05/2019  9:21 AM

## 2019-08-06 LAB — RENAL FUNCTION PANEL
Albumin: 2.8 g/dL — ABNORMAL LOW (ref 3.5–5.0)
Anion gap: 14 (ref 5–15)
BUN: 30 mg/dL — ABNORMAL HIGH (ref 8–23)
CO2: 21 mmol/L — ABNORMAL LOW (ref 22–32)
Calcium: 8.8 mg/dL — ABNORMAL LOW (ref 8.9–10.3)
Chloride: 105 mmol/L (ref 98–111)
Creatinine, Ser: 1.98 mg/dL — ABNORMAL HIGH (ref 0.61–1.24)
GFR calc Af Amer: 39 mL/min — ABNORMAL LOW (ref >60)
GFR calc non Af Amer: 33 mL/min — ABNORMAL LOW (ref >60)
Glucose, Bld: 179 mg/dL — ABNORMAL HIGH (ref 70–99)
Phosphorus: 3.5 mg/dL (ref 2.5–4.6)
Potassium: 3.9 mmol/L (ref 3.5–5.1)
Sodium: 140 mmol/L (ref 135–145)

## 2019-08-06 LAB — GLUCOSE, CAPILLARY
Glucose-Capillary: 130 mg/dL — ABNORMAL HIGH (ref 70–99)
Glucose-Capillary: 151 mg/dL — ABNORMAL HIGH (ref 70–99)
Glucose-Capillary: 161 mg/dL — ABNORMAL HIGH (ref 70–99)
Glucose-Capillary: 163 mg/dL — ABNORMAL HIGH (ref 70–99)
Glucose-Capillary: 212 mg/dL — ABNORMAL HIGH (ref 70–99)

## 2019-08-06 MED ORDER — METOPROLOL SUCCINATE ER 50 MG PO TB24
100.0000 mg | ORAL_TABLET | Freq: Every day | ORAL | Status: DC
  Administered 2019-08-06: 100 mg via ORAL
  Filled 2019-08-06: qty 2

## 2019-08-06 MED ORDER — SITAGLIPTIN PHOSPHATE 25 MG PO TABS: 25.0000 mg | ORAL_TABLET | Freq: Every day | ORAL | 3 refills | Status: DC

## 2019-08-06 MED ORDER — PHENYLEPH-SHARK LIV OIL-MO-PET 0.25-3-14-71.9 % RE OINT: 1.0000 "application " | TOPICAL_OINTMENT | Freq: Two times a day (BID) | RECTAL | 0 refills | Status: DC | PRN

## 2019-08-06 MED ORDER — OXYCODONE HCL 5 MG PO TABS: 5.0000 mg | ORAL_TABLET | Freq: Four times a day (QID) | ORAL | 0 refills | Status: DC | PRN

## 2019-08-06 MED ORDER — PHENYLEPH-SHARK LIV OIL-MO-PET 0.25-3-14-71.9 % RE OINT
1.0000 "application " | TOPICAL_OINTMENT | Freq: Two times a day (BID) | RECTAL | Status: DC | PRN
  Filled 2019-08-06: qty 57

## 2019-08-06 MED ORDER — METOPROLOL SUCCINATE ER 100 MG PO TB24: 100.0000 mg | ORAL_TABLET | Freq: Every day | ORAL | 3 refills | Status: DC

## 2019-08-06 NOTE — Progress Notes (Signed)
Nsg Discharge Note  Admit Date:  07/31/2019 Discharge date: 08/06/2019   Gabriel Schultz to be D/C'd Home per MD order.  AVS completed.  Copy for chart, and copy for patient signed, and dated. Tiffany reviewed ed/c paperwork with patient and wife. Answered all questions and wheeled stable patient and belongings to main entrance where he was picked up by his wife to ed/c to [place] able to verbalize understanding.  Discharge Medication: Allergies as of 08/06/2019   No Known Allergies     Medication List    STOP taking these medications   metFORMIN 1000 MG tablet Commonly known as: GLUCOPHAGE     TAKE these medications   allopurinol 100 MG tablet Commonly known as: ZYLOPRIM Take 100 mg by mouth every other day. In the evening   ALPRAZolam 0.5 MG tablet Commonly known as: XANAX Take 0.25-0.5 mg by mouth 3 (three) times daily as needed for anxiety.   amLODipine 10 MG tablet Commonly known as: NORVASC Take 10 mg by mouth every evening.   aspirin EC 81 MG tablet Take 81 mg by mouth every evening.   fluticasone 50 MCG/ACT nasal spray Commonly known as: FLONASE Place 1-2 sprays into both nostrils 2 (two) times daily as needed for allergies or rhinitis.   glipiZIDE 5 MG 24 hr tablet Commonly known as: GLUCOTROL XL Take 5 mg by mouth every morning.   methocarbamol 750 MG tablet Commonly known as: ROBAXIN Take 750 mg by mouth 2 (two) times daily as needed for muscle spasms (back pain).   metoprolol succinate 100 MG 24 hr tablet Commonly known as: TOPROL-XL Take 1 tablet (100 mg total) by mouth daily. Take with or immediately following a meal. Start taking on: August 07, 2019 What changed:   medication strength  how much to take  additional instructions   Mucinex Maximum Strength 1200 MG Tb12 Generic drug: Guaifenesin Take 1,200 mg by mouth daily.   naproxen 500 MG tablet Commonly known as: NAPROSYN Take 500 mg by mouth 2 (two) times daily as needed  (back pain/neck stiffness).   oxyCODONE 5 MG immediate release tablet Commonly known as: Oxy IR/ROXICODONE Take 1-2 tablets (5-10 mg total) by mouth every 6 (six) hours as needed for moderate pain or severe pain.   pantoprazole 40 MG tablet Commonly known as: PROTONIX Take 40 mg by mouth 2 (two) times daily.   phenylephrine-shark liver oil-mineral oil-petrolatum 0.25-3-14-71.9 % rectal ointment Commonly known as: PREPARATION H Place 1 application rectally 2 (two) times daily as needed for hemorrhoids.   pravastatin 20 MG tablet Commonly known as: PRAVACHOL Take 20 mg by mouth every evening.   sitaGLIPtin 25 MG tablet Commonly known as: Januvia Take 1 tablet (25 mg total) by mouth daily.   sodium chloride 0.65 % Soln nasal spray Commonly known as: OCEAN Place 2 sprays into both nostrils 3 (three) times daily as needed for congestion.            Discharge Care Instructions  (From admission, onward)         Start     Ordered   08/06/19 0000  If the dressing is still on your incision site when you go home, remove it on the third day after your surgery date. Remove dressing if it begins to fall off, or if it is dirty or damaged before the third day.        08/06/19 1506          Discharge Assessment: Vitals:   08/06/19  0420 08/06/19 1422  BP: (!) 174/89 132/80  Pulse: 87 80  Resp:  19  Temp: 98.8 F (37.1 C) 98.3 F (36.8 C)  SpO2: 96% 98%   Skin clean, dry and intact without evidence of skin break down, no evidence of skin tears noted. IV catheter discontinued intact. Site without signs and symptoms of complications - no redness or edema noted at insertion site, patient denies c/o pain - only slight tenderness at site.  Dressing with slight pressure applied.  D/c Instructions-Education: Discharge instructions given to patient/family with verbalized understanding. D/c education completed with patient/family including follow up instructions, medication list, d/c  activities limitations if indicated, with other d/c instructions as indicated by MD - patient able to verbalize understanding, all questions fully answered. Patient instructed to return to ED, call 911, or call MD for any changes in condition.  Patient escorted via [place], and D/C home via private auto.  Santa Lighter, RN 08/06/2019 5:05 PM

## 2019-08-06 NOTE — Progress Notes (Addendum)
Nephrology Progress Note:  Patient ID: Gabriel Schultz, male   DOB: 1951-01-01, 69 y.o.   MRN: [id number]    S:  He had 3.3 liters UOP over 6/16.  Feels more awake today.  Having discomfort - backside.  Has hemorrhoids.  We discussed need for follow-up in [facility] office.    Review of systems:   Denies n/v - Denies shortness of breath or chest pain  Denies difficulty with urination  O:BP (!) 174/89 (BP Location: Right Arm)    Pulse 87    Temp 98.8 F (37.1 C) (Oral)    Resp 20    Ht 6\' 1"  (1.854 m)    Wt 115 kg    SpO2 96%    BMI 33.45 kg/m   Intake/Output Summary (Last 24 hours) at 08/06/2019 0859 Last data filed at 08/05/2019 2220 Gross per 24 hour  Intake 840 ml  Output 2550 ml  Net -1710 ml   Intake/Output: I/O last 3 completed shifts: In: 840 [P.O.:840] Out: 3550 [Urine:3550]  Intake/Output this shift:  No intake/output data recorded. Weight change:    Physical Exam: Gen: NAD CVS: RRR, no rub Resp: cta unlabored Abd: softly dist; no tenderness to light palpation  Ext: no edema Neuro - awake and conversant  Psych normal mood and affect  Recent Labs  Lab 07/30/19 1535 07/30/19 1535 07/31/19 0950 08/01/19 0643 08/02/19 0852 08/03/19 0604 08/04/19 0556 08/05/19 0541 08/06/19 0502  NA 133*   < > 135 136 130* 132* 136 139 140  K 4.3   < > 4.4 4.7 4.4 3.9 3.8 4.0 3.9  CL 102   < > 99 101 99 100 102 105 105  CO2 21*   < > 21* 20* 19* 17* 21* 21* 21*  GLUCOSE 227*   < > 210* 147* 214* 207* 185* 170* 179*  BUN 19   < > 19 34* 42* 38* 37* 36* 30*  CREATININE 1.86*   < > 2.05* 3.75* 3.61* 2.99* 2.39* 2.16* 1.98*  ALBUMIN 3.8   < > 3.7 3.4* 3.1* 3.0* 3.0* 2.8* 2.8*  CALCIUM 8.9   < > 9.0 8.1* 7.8* 8.2* 8.5* 8.7* 8.8*  PHOS  --   --   --   --   --   --  2.3* 3.1 3.5  AST 19  --  14* 81* 45* 26  --   --   --   ALT 17  --  16 60* 50* 39  --   --   --    < > = values in this interval not displayed.   Liver Function Tests: Recent Labs  Lab 08/01/19 0643  08/01/19 2500 08/02/19 0852 08/02/19 0852 08/03/19 0604 08/03/19 0604 08/04/19 0556 08/05/19 0541 08/06/19 0502  AST 81*  --  45*  --  26  --   --   --   --   ALT 60*  --  50*  --  39  --   --   --   --   ALKPHOS 44  --  49  --  51  --   --   --   --   BILITOT 0.9  --  0.9  --  0.7  --   --   --   --   PROT 6.8  --  7.1  --  7.1  --   --   --   --   ALBUMIN 3.4*   < > 3.1*   < >  3.0*   < > 3.0* 2.8* 2.8*   < > = values in this interval not displayed.   Recent Labs  Lab 07/30/19 1535 07/31/19 0950  LIPASE 25 20   CBC: Recent Labs  Lab 07/31/19 0950 07/31/19 0950 08/01/19 0643 08/01/19 0643 08/02/19 0852 08/02/19 0852 08/03/19 0604 08/04/19 0556 08/05/19 0542  WBC 12.1*   < > 9.7   < > 10.5   < > 9.4 8.5 6.7  NEUTROABS 8.5*  --   --   --   --   --   --   --   --   HGB 13.5   < > 12.0*   < > 11.5*   < > 11.3* 11.4* 11.3*  HCT 41.1   < > 37.2*   < > 35.9*   < > 34.9* 35.7* 34.9*  MCV 89.3   < > 90.3  --  90.4  --  90.6 90.8 89.7  PLT 235   < > 207   < > 215   < > 216 277 275   < > = values in this interval not displayed.   Cardiac Enzymes: Recent Labs  Lab 08/02/19 0852  CKTOTAL 403*   CBG: Recent Labs  Lab 08/05/19 1552 08/05/19 2030 08/06/19 0046 08/06/19 0419 08/06/19 0804  GLUCAP 194* 180* 130* 151* 161*    Iron Studies: No results for input(s): IRON, TIBC, TRANSFERRIN, FERRITIN in the last 72 hours. Studies/Results: No results found.  amLODipine  10 mg Oral Daily   guaiFENesin  1,200 mg Oral BID   heparin  5,000 Units Subcutaneous Q8H   insulin aspart  0-15 Units Subcutaneous Q4H   LORazepam  2 mg Intramuscular Once   metoprolol succinate  50 mg Oral Daily   pantoprazole  40 mg Oral Daily   QUEtiapine  12.5 mg Oral QHS    BMET    Component Value Date/Time   NA 140 08/06/2019 0502   K 3.9 08/06/2019 0502   CL 105 08/06/2019 0502   CO2 21 (L) 08/06/2019 0502   GLUCOSE 179 (H) 08/06/2019 0502   BUN 30 (H) 08/06/2019 0502    CREATININE 1.98 (H) 08/06/2019 0502   CALCIUM 8.8 (L) 08/06/2019 0502   GFRNONAA 33 (L) 08/06/2019 0502   GFRAA 39 (L) 08/06/2019 0502   CBC    Component Value Date/Time   WBC 6.7 08/05/2019 0542   RBC 3.89 (L) 08/05/2019 0542   HGB 11.3 (L) 08/05/2019 0542   HCT 34.9 (L) 08/05/2019 0542   PLT 275 08/05/2019 0542   MCV 89.7 08/05/2019 0542   MCH 29.0 08/05/2019 0542   MCHC 32.4 08/05/2019 0542   RDW 14.6 08/05/2019 0542   LYMPHSABS 2.3 07/31/2019 0950   MONOABS 1.3 (H) 07/31/2019 0950   EOSABS 0.0 07/31/2019 0950   BASOSABS 0.0 07/31/2019 0950    Assessment/Plan:  1. AKI/CKD stage IIIb, non-oliguric- presumably due to ischemic ATN in setting of hypotension and sepsis.  Increased UOP and improvement of Cr closer to baseline 1. Continue supportive care  1. Hold metformin and would not resume on discharge 2. Avoid all NSAIDs and COX-II I's  2. Acute gangrenous cholecystitis with incarcerated umbilical hernia- s/p lap chole and primary umbilical hernia repair on 6/11.   3. CKD stage IIIb- presumably due to DM, HTN, and chronic NSAID use.  Baseline appears to be 1.7-1.9.  Will need follow up after discharge with our office in [place] as below. 4. HTN- Would increase metoprolol succinate to  100 mg daily from home dose of 50 mg daily.   5. AMS- ?delirium likely from AKI/Sepsis, delayed effects of anesthesia/pain meds.  Improving  6. DM type II- metformin stopped due to AKI 7. CAD- h/o MI in 2003.  Stable. 8. Gout- per primary 9. GERD- on PPI 10. Chronic back pain-he will need an alternative to NSAIDs.  Disposition - improving from a renal standpoint.  Nephrology will sign off.  Disposition per primary team.  We will set up follow-up in the [place] office after discharge to be seen in 2-3 weeks.   Claudia Desanctis, MD 08/06/2019  9:17 AM

## 2019-08-06 NOTE — Plan of Care (Signed)
  Problem: Education: Goal: Knowledge of General Education information will improve Description: Including pain rating scale, medication(s)/side effects and non-pharmacologic comfort measures Outcome: Progressing   Problem: Clinical Measurements: Goal: Ability to maintain clinical measurements within normal limits will improve Outcome: Progressing   

## 2019-08-06 NOTE — Plan of Care (Signed)
  Problem: Education: Goal: Knowledge of General Education information will improve Description: Including pain rating scale, medication(s)/side effects and non-pharmacologic comfort measures 08/06/2019 1648 by Santa Lighter, RN Outcome: Progressing 08/06/2019 1201 by Santa Lighter, RN Outcome: Progressing   Problem: Health Behavior/Discharge Planning: Goal: Ability to manage health-related needs will improve Outcome: Progressing   Problem: Clinical Measurements: Goal: Ability to maintain clinical measurements within normal limits will improve 08/06/2019 1648 by Santa Lighter, RN Outcome: Progressing 08/06/2019 1201 by Santa Lighter, RN Outcome: Progressing Goal: Will remain free from infection Outcome: Progressing Goal: Diagnostic test results will improve Outcome: Progressing Goal: Respiratory complications will improve Outcome: Progressing Goal: Cardiovascular complication will be avoided Outcome: Progressing   Problem: Activity: Goal: Risk for activity intolerance will decrease Outcome: Progressing   Problem: Nutrition: Goal: Adequate nutrition will be maintained Outcome: Progressing   Problem: Coping: Goal: Level of anxiety will decrease Outcome: Progressing   Problem: Elimination: Goal: Will not experience complications related to bowel motility Outcome: Progressing Goal: Will not experience complications related to urinary retention Outcome: Progressing   Problem: Pain Managment: Goal: General experience of comfort will improve Outcome: Progressing   Problem: Safety: Goal: Ability to remain free from injury will improve Outcome: Progressing   Problem: Skin Integrity: Goal: Risk for impaired skin integrity will decrease Outcome: Progressing

## 2019-08-06 NOTE — Progress Notes (Signed)
PROGRESS NOTE    Gabriel Schultz  ZJQ:[id number] DOB: Nov 26, 1950 DOA: 07/31/2019 PCP: Cory Munch, PA-C   Brief Narrative:  Per HPI: Gabriel Schultz a 69 y.o.malewith medical history significant forCAD with prior MI in 2003, prior tobacco abuse, hypertension, dyslipidemia, chronic gout, type 2 diabetes, and GERD who presented to the ED initially on 6/10 with complaints of some epigastric and chest pain. He was noted to have some radiation of pain to the center of his back and had some nausea and vomiting. He was sent home after he was ruled out for acute coronary syndrome.He presented back today after he woke up with severe right upper quadrant abdominal pain with some dry heaving. There appears to be no exacerbating or alleviating factors and no radiation of the pain. He denies any fevers, chills, diarrhea, constipation, shortness of breath, or chest pain.  -Patient was admitted with acute cholecystitis and is status post laparoscopic cholecystectomy with removal of gangrenous gallbladder as well as umbilical hernia repair on 6/11.He continues to have persistent hypoxemia, but is being weaned from his oxygen requirement and is down to 3 L currently. He was noted to have some agitation and hallucinations last night requiring some Haldol.  He was started on Seroquel with some improvement in his agitation.  He appears to be tolerating his diet.  Plan is to continue to wean oxygen through today and ambulate further.  Continue IV fluid as recommended by nephrology and follow-up outpatient once discharged.  Assessment & Plan:   Principal Problem:   Acute gangrenous cholecystitis Active Problems:   Umbilical hernia without obstruction and without gangrene   Acutegangrenous cholecystitis with incarcerated umbilical hernia -Status post laparoscopic cholecystectomy and primary umbilical hernia repair on 6/11 -Advancedto carb modified diet and is tolerating diet well -Rocephin  discontinued on 6/13 -Pain management with IV Dilaudid as needed -ContinueIV Zofran as needed  Acute hypoxemic respiratory failure-improving -Related to postoperative atelectasis,noted to be severe and bilateral on chest x-ray overnight -Continues to require 3 L of oxygen supplementation which will need to be weaned further today -Discussed with RN the need to ambulate patient and use incentive spirometry -Ambulatory O2 study today, may require O2 on discharge if not weaning by tomorrow -Up in chair  AKI on CKD stage IIIb -Likely with some ischemic ATN with some perioperative hypotension -Urine output only recorded as1000 mL overnight -Creatinine noted to be 1.98today with baseline noted to be near 1.7-1.9 -Continue monitor strict I's and O's and daily weights -Continue time-limited IV fluid for 12 hours as recommended by nephrology -Urine electrolytes evaluated with FENa 0.3% suggesting prerenal etiology -Renal ultrasound without any acute findings -Can be discharged from renal standpoint soon and followed up with [facility] -Plan to hold Metformin on discharge  Hemorrhoidal discomfort -Ordered Preparation H ointment  History of CAD with prior MI in 2003/hypertension/dyslipidemia -Plan to hold blood pressure medications and statin for now -Metoprolol dose increased to 100 mg daily due to hypertension noted by nephrology  History of chronic gout -Resume home allopurinol  History of type 2 diabetes with noted hyperglycemia-improved -continue sliding scale insulin. -Continue holding oral hypoglycemic agents while inpatient and likely will not resume on discharge. -Hemoglobin A1c 7.4%  History of GERD -IV PPI to oral   Obesity -BMI 33.45 kg/m -Low calorie diet, portion control and increase physical activity discussed with patient.  Delirium-improved -Likely secondary to hospitalization -Low-dose Seroquel has helped -continue constant  re-orientation -follow clinical improvement.   DVT prophylaxis:Heparin Code  Status:Full code Family Communication:Wife at bedside, discussed case on 6/17 Disposition Plan:Antibiotics completed. Appreciate ongoing nephrology involvement for AKI work-up and treatment.Continue toWean oxygen as tolerated and plan to perform ambulatory O2 study today  Status is: Inpatient  Remains inpatient appropriate because:IV treatments appropriate due to intensity of illness or inability to take PO and Inpatient level of care appropriate due to severity of illness   Dispo: The patient is from:Home Anticipated d/c is TK:[facility] Anticipated d/c date is:1day Patient currently is not medically stable to d/c.He continues to haveoxygen requirement and is delirious, but improving. Renal function improving. Plan to wean oxygen today and consider O2 on discharge as needed.  Consultants:  General surgery-Dr. Constance Haw  Nephrology  Procedures:  Status post laparoscopic cholecystectomyandumbilical hernia repair 7/35  Antimicrobials:  Anti-infectives (From admission, onward)   Start     Dose/Rate Route Frequency Ordered Stop   08/01/19 1100  cefTRIAXone (ROCEPHIN) 2 g in sodium chloride 0.9 % 100 mL IVPB  Status:  Discontinued        2 g 200 mL/hr over 30 Minutes Intravenous Every 24 hours 07/31/19 1251 07/31/19 1831   08/01/19 1100  cefTRIAXone (ROCEPHIN) 2 g in sodium chloride 0.9 % 100 mL IVPB        2 g 200 mL/hr over 30 Minutes Intravenous Every 24 hours 07/31/19 1831 08/02/19 1036   08/01/19 0600  cefoTEtan (CEFOTAN) 2 g in sodium chloride 0.9 % 100 mL IVPB        2 g 200 mL/hr over 30 Minutes Intravenous On call to O.R. 07/31/19 1316 08/01/19 0916   07/31/19 1100  cefTRIAXone (ROCEPHIN) 2 g in sodium chloride 0.9 % 100 mL IVPB        2 g 200 mL/hr over 30 Minutes Intravenous  Once 07/31/19 1057 07/31/19 1153        Subjective: Patient seen and evaluated today with no new acute complaints or concerns.  He complains of some hemorrhoidal pain today and was noted to be hypertensive overnight.  He lost his IV access and pulled it out as well.  Objective: Vitals:   08/05/19 1322 08/05/19 2017 08/05/19 2028 08/06/19 0420  BP: 134/79  (!) 175/90 (!) 174/89  Pulse: 88  88 87  Resp: 19  20   Temp: 98 F (36.7 C)  99.1 F (37.3 C) 98.8 F (37.1 C)  TempSrc: Oral  Oral Oral  SpO2: 98% 95% 98% 96%  Weight:      Height:        Intake/Output Summary (Last 24 hours) at 08/06/2019 0953 Last data filed at 08/05/2019 2220 Gross per 24 hour  Intake 600 ml  Output 2550 ml  Net -1950 ml   Filed Weights   07/31/19 0942  Weight: 115 kg    Examination:  General exam: Appears calm and comfortable  Respiratory system: Clear to auscultation. Respiratory effort normal.  Currently still on 2-3 L nasal cannula oxygen. Cardiovascular system: S1 & S2 heard, RRR. No JVD, murmurs, rubs, gallops or clicks. No pedal edema. Gastrointestinal system: Abdomen with minimal distention and incisions clean dry and intact, soft and nontender. No organomegaly or masses felt. Normal bowel sounds heard. Central nervous system: Alert and oriented. No focal neurological deficits. Extremities: Symmetric 5 x 5 power. Skin: No rashes, lesions or ulcers Psychiatry: Judgement and insight appear normal. Mood & affect appropriate.     Data Reviewed: I have personally reviewed following labs and imaging studies  CBC: Recent Labs  Lab 07/31/19 0950 07/31/19  4268 08/01/19 3419 08/02/19 0852 08/03/19 0604 08/04/19 0556 08/05/19 0542  WBC 12.1*   < > 9.7 10.5 9.4 8.5 6.7  NEUTROABS 8.5*  --   --   --   --   --   --   HGB 13.5   < > 12.0* 11.5* 11.3* 11.4* 11.3*  HCT 41.1   < > 37.2* 35.9* 34.9* 35.7* 34.9*  MCV 89.3   < > 90.3 90.4 90.6 90.8 89.7  PLT 235   < > 207 215 216 277 275   < > = values in this interval not  displayed.   Basic Metabolic Panel: Recent Labs  Lab 08/01/19 0643 08/01/19 0643 08/02/19 0852 08/03/19 0604 08/04/19 0556 08/05/19 0541 08/06/19 0502  NA 136   < > 130* 132* 136 139 140  K 4.7   < > 4.4 3.9 3.8 4.0 3.9  CL 101   < > 99 100 102 105 105  CO2 20*   < > 19* 17* 21* 21* 21*  GLUCOSE 147*   < > 214* 207* 185* 170* 179*  BUN 34*   < > 42* 38* 37* 36* 30*  CREATININE 3.75*   < > 3.61* 2.99* 2.39* 2.16* 1.98*  CALCIUM 8.1*   < > 7.8* 8.2* 8.5* 8.7* 8.8*  MG 1.8  --   --   --   --   --   --   PHOS  --   --   --   --  2.3* 3.1 3.5   < > = values in this interval not displayed.   GFR: Estimated Creatinine Clearance: 46.8 mL/min (A) (by C-G formula based on SCr of 1.98 mg/dL (H)). Liver Function Tests: Recent Labs  Lab 07/30/19 1535 07/30/19 1535 07/31/19 0950 07/31/19 0950 08/01/19 0643 08/01/19 0643 08/02/19 0852 08/03/19 0604 08/04/19 0556 08/05/19 0541 08/06/19 0502  AST 19  --  14*  --  81*  --  45* 26  --   --   --   ALT 17  --  16  --  60*  --  50* 39  --   --   --   ALKPHOS 52  --  52  --  44  --  49 51  --   --   --   BILITOT 1.0  --  1.4*  --  0.9  --  0.9 0.7  --   --   --   PROT 7.5  --  7.7  --  6.8  --  7.1 7.1  --   --   --   ALBUMIN 3.8   < > 3.7   < > 3.4*   < > 3.1* 3.0* 3.0* 2.8* 2.8*   < > = values in this interval not displayed.   Recent Labs  Lab 07/30/19 1535 07/31/19 0950  LIPASE 25 20   No results for input(s): AMMONIA in the last 168 hours. Coagulation Profile: No results for input(s): INR, PROTIME in the last 168 hours. Cardiac Enzymes: Recent Labs  Lab 08/02/19 0852  CKTOTAL 403*   BNP (last 3 results) No results for input(s): PROBNP in the last 8760 hours. HbA1C: No results for input(s): HGBA1C in the last 72 hours. CBG: Recent Labs  Lab 08/05/19 1552 08/05/19 2030 08/06/19 0046 08/06/19 0419 08/06/19 0804  GLUCAP 194* 180* 130* 151* 161*   Lipid Profile: No results for input(s): CHOL, HDL, LDLCALC, TRIG,  CHOLHDL, LDLDIRECT in the last 72 hours. Thyroid Function Tests:  No results for input(s): TSH, T4TOTAL, FREET4, T3FREE, THYROIDAB in the last 72 hours. Anemia Panel: No results for input(s): VITAMINB12, FOLATE, FERRITIN, TIBC, IRON, RETICCTPCT in the last 72 hours. Sepsis Labs: No results for input(s): PROCALCITON, LATICACIDVEN in the last 168 hours.  Recent Results (from the past 240 hour(s))  SARS Coronavirus 2 by RT PCR (hospital order, performed in [facility] hospital lab) Nasopharyngeal Nasopharyngeal Swab     Status: None   Collection Time: 07/31/19 12:09 PM   Specimen: Nasopharyngeal Swab  Result Value Ref Range Status   SARS Coronavirus 2 NEGATIVE NEGATIVE Final    Comment: (NOTE) SARS-CoV-2 target nucleic acids are NOT DETECTED.  The SARS-CoV-2 RNA is generally detectable in upper and lower respiratory specimens during the acute phase of infection. The lowest concentration of SARS-CoV-2 viral copies this assay can detect is 250 copies / mL. A negative result does not preclude SARS-CoV-2 infection and should not be used as the sole basis for treatment or other patient management decisions.  A negative result may occur with improper specimen collection / handling, submission of specimen other than nasopharyngeal swab, presence of viral mutation(s) within the areas targeted by this assay, and inadequate number of viral copies (<250 copies / mL). A negative result must be combined with clinical observations, patient history, and epidemiological information.  Fact Sheet for Patients:   StrictlyIdeas.no  Fact Sheet for Healthcare Providers: BankingDealers.co.za  This test is not yet approved or  cleared by the [place] FDA and has been authorized for detection and/or diagnosis of SARS-CoV-2 by FDA under an Emergency Use Authorization (EUA).  This EUA will remain in effect (meaning this test can be used) for the duration of  the COVID-19 declaration under Section 564(b)(1) of the Act, 21 U.S.C. section 360bbb-3(b)(1), unless the authorization is terminated or revoked sooner.  Performed at [facility], [address] [place], [place] [address]          Radiology Studies: No results found.      Scheduled Meds: . amLODipine  10 mg Oral Daily  . guaiFENesin  1,200 mg Oral BID  . heparin  5,000 Units Subcutaneous Q8H  . insulin aspart  0-15 Units Subcutaneous Q4H  . LORazepam  2 mg Intramuscular Once  . metoprolol succinate  100 mg Oral Daily  . pantoprazole  40 mg Oral Daily  . QUEtiapine  12.5 mg Oral QHS    LOS: 6 days    Time spent: 30 minutes    Barre Aydelott Darleen Crocker, DO [facility]  If 7PM-7AM, please contact night-coverage [email or web address] 08/06/2019, 9:53 AM

## 2019-08-06 NOTE — Progress Notes (Signed)
SATURATION QUALIFICATIONS: (This note is used to comply with regulatory documentation for home oxygen)  Patient Saturations on Room Air at Rest = 99%  Patient Saturations on Room Air while Ambulating = 97%  Patient Saturations on n/a Liters of oxygen while Ambulating = n/a%  Please briefly explain why patient needs home oxygen:

## 2019-08-06 NOTE — Progress Notes (Signed)
[facility]  Reports having hemorrhoid flare. Tolerating food. Plan to dc tomorrow. Incision at umbilicus looking more like bruise/ hematoma as the redness contained right over the left side and not extending. Wife will keep eye on it.  Will follow up with Dr. Arnoldo Morale for staple removal next week.  Curlene Labrum, MD [facility] [address] [place], [place] [phone] (office)

## 2019-08-06 NOTE — Discharge Summary (Signed)
Physician Discharge Summary  [name] TKP:[id number] DOB: Aug 07, 1950 DOA: 07/31/2019  PCP: Cory Munch, PA-C  Admit date: 07/31/2019  Discharge date: 08/06/2019  Admitted From:Home  Disposition:  Home  Recommendations for Outpatient Follow-up:  1. Follow up with PCP in 1-2 weeks 2. Follow-up with general surgery Dr. Arnoldo Morale as scheduled on 6/24 for staple removal 3. Follow-up with [facility] with appointment that will be scheduled in 2-3 weeks 4. Avoid further use of Metformin 5. Continue home glipizide as prior 6. Januvia prescribed as new medication to help with blood glucose control.  Continue to monitor blood glucose carefully at home. 7. Continue on metoprolol XL increased dose of 100 mg daily. 8. Oxycodone prescribed for 10 tablets and 0 refills for pain management 9. Hemorrhoid cream prescribed  Home Health: None  Equipment/Devices: None  Discharge Condition: Stable  CODE STATUS: Full  Diet recommendation: Heart Healthy/carb modified  Brief/Interim Summary: Per HPI: Gabriel Arroyave Mooreis a 69 y.o.malewith medical history significant forCAD with prior MI in 2003, prior tobacco abuse, hypertension, dyslipidemia, chronic gout, type 2 diabetes, and GERD who presented to the ED initially on 6/10 with complaints of some epigastric and chest pain. He was noted to have some radiation of pain to the center of his back and had some nausea and vomiting. He was sent home after he was ruled out for acute coronary syndrome.He presented back today after he woke up with severe right upper quadrant abdominal pain with some dry heaving. There appears to be no exacerbating or alleviating factors and no radiation of the pain. He denies any fevers, chills, diarrhea, constipation, shortness of breath, or chest pain.  -Patient was admitted with acute cholecystitis and is status post laparoscopic cholecystectomy with removal of gangrenous gallbladder as well as  umbilical hernia repair on 6/11.  Acutegangrenous cholecystitis with incarcerated umbilical hernia -Status post laparoscopic cholecystectomy and primary umbilical hernia repair on 6/11 -Tolerating diet -Pain management with oxycodone as prescribed -Follow-up with Dr. Arnoldo Morale in outpatient on 6/24 for staple removal as scheduled  Acute hypoxemic respiratory failure -Related to atelectasis -Resolved  AKI on CKD stage IIIb -Creatinine now 1.98 and baseline near 1.7-1.9 -Patient to follow-up with [facility] at [facility] office which will be scheduled in 2-3 weeks -Avoid Metformin as well as NSAIDs and other Cox 2 inhibitors  Hemorrhoidal discomfort -Ordered Preparation H ointment  History of CAD with prior MI in 2003/hypertension/dyslipidemia -Continue home medications with statin -Metoprolol dose increased to 100 mg daily  History of chronic gout -Continue home allopurinol  History of type 2 diabetes with noted hyperglycemia-improved -Avoid Metformin given renal function.  Continue home glipizide and Januvia as prescribed -Hemoglobin A1c 7.4%  History of GERD -Continue oral Protonix  Obesity -BMI 33.45 kg/m -Low calorie diet, portion control and increase physical activity discussed with patient.  Delirium-improved -No further need for Seroquel at home  Discharge Diagnoses:  Principal Problem:   Acute gangrenous cholecystitis Active Problems:   Umbilical hernia without obstruction and without gangrene    Discharge Instructions  Discharge Instructions    Diet - low sodium heart healthy   Complete by: As directed    If the dressing is still on your incision site when you go home, remove it on the third day after your surgery date. Remove dressing if it begins to fall off, or if it is dirty or damaged before the third day.   Complete by: As directed    Increase activity slowly   Complete  by: As directed      Allergies as of 08/06/2019    No Known Allergies     Medication List    STOP taking these medications   metFORMIN 1000 MG tablet Commonly known as: GLUCOPHAGE     TAKE these medications   allopurinol 100 MG tablet Commonly known as: ZYLOPRIM Take 100 mg by mouth every other day. In the evening   ALPRAZolam 0.5 MG tablet Commonly known as: XANAX Take 0.25-0.5 mg by mouth 3 (three) times daily as needed for anxiety.   amLODipine 10 MG tablet Commonly known as: NORVASC Take 10 mg by mouth every evening.   aspirin EC 81 MG tablet Take 81 mg by mouth every evening.   fluticasone 50 MCG/ACT nasal spray Commonly known as: FLONASE Place 1-2 sprays into both nostrils 2 (two) times daily as needed for allergies or rhinitis.   glipiZIDE 5 MG 24 hr tablet Commonly known as: GLUCOTROL XL Take 5 mg by mouth every morning.   methocarbamol 750 MG tablet Commonly known as: ROBAXIN Take 750 mg by mouth 2 (two) times daily as needed for muscle spasms (back pain).   metoprolol succinate 100 MG 24 hr tablet Commonly known as: TOPROL-XL Take 1 tablet (100 mg total) by mouth daily. Take with or immediately following a meal. Start taking on: August 07, 2019 What changed:   medication strength  how much to take  additional instructions   Mucinex Maximum Strength 1200 MG Tb12 Generic drug: Guaifenesin Take 1,200 mg by mouth daily.   naproxen 500 MG tablet Commonly known as: NAPROSYN Take 500 mg by mouth 2 (two) times daily as needed (back pain/neck stiffness).   oxyCODONE 5 MG immediate release tablet Commonly known as: Oxy IR/ROXICODONE Take 1-2 tablets (5-10 mg total) by mouth every 6 (six) hours as needed for moderate pain or severe pain.   pantoprazole 40 MG tablet Commonly known as: PROTONIX Take 40 mg by mouth 2 (two) times daily.   phenylephrine-shark liver oil-mineral oil-petrolatum 0.25-3-14-71.9 % rectal ointment Commonly known as: PREPARATION H Place 1 application rectally 2 (two) times daily  as needed for hemorrhoids.   pravastatin 20 MG tablet Commonly known as: PRAVACHOL Take 20 mg by mouth every evening.   sitaGLIPtin 25 MG tablet Commonly known as: Januvia Take 1 tablet (25 mg total) by mouth daily.   sodium chloride 0.65 % Soln nasal spray Commonly known as: OCEAN Place 2 sprays into both nostrils 3 (three) times daily as needed for congestion.            Discharge Care Instructions  (From admission, onward)         Start     Ordered   08/06/19 0000  If the dressing is still on your incision site when you go home, remove it on the third day after your surgery date. Remove dressing if it begins to fall off, or if it is dirty or damaged before the third day.        08/06/19 1506          Follow-up Information    Virl Cagey, MD Follow up on 08/13/2019.   Specialty: General Surgery Why: Will need to see Dr. Arnoldo Morale to get staples out. Dr. Constance Haw will be on vacation.  Contact information: [address] [place] [facility] [address] [phone]        Kidney, [place] Follow up in 2 week(s).   Why: Appointment will be made for [facility] office. Contact information: [address]  [place] [address] [phone]        Cory Munch, PA-C Follow up in 1 week(s).   Specialties: Physician Assistant, Internal Medicine Contact information: [place] [address] [phone]              No Known Allergies  Consultations: General surgery Nephrology  Procedures/Studies: DG Chest 2 View  Result Date: 07/30/2019 CLINICAL DATA:  Midsternal chest pain radiating to back for several days, nausea and emesis EXAM: CHEST - 2 VIEW COMPARISON:  02/25/2019 FINDINGS: Frontal and lateral views of the chest demonstrate an unremarkable cardiac silhouette. No airspace disease, effusion, or pneumothorax. No acute bony abnormalities. IMPRESSION: 1. No acute intrathoracic process. Electronically Signed   By: Randa Ngo  M.D.   On: 07/30/2019 16:50   US RENAL  Result Date: 08/02/2019 CLINICAL DATA:  Acute kidney injury EXAM: RENAL / URINARY TRACT ULTRASOUND COMPLETE COMPARISON:  11/29/2015 FINDINGS: Right Kidney: Renal measurements: 11 x 6 x 6 cm = volume: 220 mL . Echogenicity within normal limits. No solid mass or hydronephrosis visualized. 5 mm interpolar cystic density Left Kidney: Renal measurements: 11 x 7 x 6 cm = volume: 270 mL. Echogenicity within normal limits. No solid mass or hydronephrosis visualized. 7 mm cystic structure. Bladder: Appears normal for degree of bladder distention. IMPRESSION: Essentially negative renal ultrasound. Electronically Signed   By: Monte Fantasia M.D.   On: 08/02/2019 08:52   US Abdomen Limited  Result Date: 07/31/2019 CLINICAL DATA:  RIGHT upper quadrant pain for 2 days, nausea, vomiting EXAM: ULTRASOUND ABDOMEN LIMITED RIGHT UPPER QUADRANT COMPARISON:  CT abdomen pelvis 09/29/2015 FINDINGS: Gallbladder: No normal appearing gallbladder identified. At expected position of gallbladder fossa, multiple echogenic foci are identified with posterior shadowing. Patient denies prior cholecystectomy, and a gallbladder was present on the prior CT exam from 2017. This likely represents a gallbladder filled with calculi creating a wall echo shadow complex. Sonographic Percell Miller sign is present. Probable edema surrounding gallbladder. Unable to measure wall thickness. Findings are highly suspicious for acute cholecystitis. Common bile duct: Diameter: 7 mm Liver: Heterogeneous increased echogenicity of the liver likely fatty infiltration of this can be seen with cirrhosis and certain infiltrative disorders. No focal hepatic mass or nodularity. Portal vein is patent on color Doppler imaging with normal direction of blood flow towards the liver. Other: No RIGHT upper quadrant free fluid. IMPRESSION: Suspected gallbladder filled with calculi creating a wall echo shadow complex. Pericholecystic fluid and  sonographic Murphy sign as well as potential mild wall thickening. Findings are highly suspicious for cholelithiasis and acute cholecystitis. Electronically Signed   By: Lavonia Dana M.D.   On: 07/31/2019 10:44   DG CHEST PORT 1 VIEW  Result Date: 08/02/2019 CLINICAL DATA:  Right side pain EXAM: PORTABLE CHEST 1 VIEW COMPARISON:  07/31/2019 FINDINGS: Very low lung volumes. Bibasilar atelectasis. Heart is accentuated by the low volumes. No visible effusions. No acute bony abnormality. IMPRESSION: Very low lung volumes, worsening since prior study. Bibasilar atelectasis. Electronically Signed   By: Rolm Baptise M.D.   On: 08/02/2019 00:37   DG Chest Port 1 View  Result Date: 07/31/2019 CLINICAL DATA:  Shortness of breath nausea. EXAM: PORTABLE CHEST 1 VIEW COMPARISON:  07/30/2019 FINDINGS: Artifact overlies the chest. Poor inspiration. Heart size is normal. Chronic interstitial lung markings, probably slightly more prominent because of the poor inspiration. Evidence of consolidation or lobar collapse. No effusion. IMPRESSION: Poor inspiration. Chronic interstitial lung markings which appear slightly more prominent, probably  due to the inspiration. No focal consolidation or collapse. Electronically Signed   By: Nelson Chimes M.D.   On: 07/31/2019 11:52     Discharge Exam: Vitals:   08/06/19 0420 08/06/19 1422  BP: (!) 174/89 132/80  Pulse: 87 80  Resp:  19  Temp: 98.8 F (37.1 C) 98.3 F (36.8 C)  SpO2: 96% 98%   Vitals:   08/05/19 2017 08/05/19 2028 08/06/19 0420 08/06/19 1422  BP:  (!) 175/90 (!) 174/89 132/80  Pulse:  88 87 80  Resp:  20  19  Temp:  99.1 F (37.3 C) 98.8 F (37.1 C) 98.3 F (36.8 C)  TempSrc:  Oral Oral Oral  SpO2: 95% 98% 96% 98%  Weight:      Height:        General: Pt is alert, awake, not in acute distress Cardiovascular: RRR, S1/S2 +, no rubs, no gallops Respiratory: CTA bilaterally, no wheezing, no rhonchi Abdominal: Soft, NT, ND, bowel sounds +, incisions  clean dry and intact Extremities: no edema, no cyanosis    The results of significant diagnostics from this hospitalization (including imaging, microbiology, ancillary and laboratory) are listed below for reference.     Microbiology: Recent Results (from the past 240 hour(s))  SARS Coronavirus 2 by RT PCR (hospital order, performed in [facility] hospital lab) Nasopharyngeal Nasopharyngeal Swab     Status: None   Collection Time: 07/31/19 12:09 PM   Specimen: Nasopharyngeal Swab  Result Value Ref Range Status   SARS Coronavirus 2 NEGATIVE NEGATIVE Final    Comment: (NOTE) SARS-CoV-2 target nucleic acids are NOT DETECTED.  The SARS-CoV-2 RNA is generally detectable in upper and lower respiratory specimens during the acute phase of infection. The lowest concentration of SARS-CoV-2 viral copies this assay can detect is 250 copies / mL. A negative result does not preclude SARS-CoV-2 infection and should not be used as the sole basis for treatment or other patient management decisions.  A negative result may occur with improper specimen collection / handling, submission of specimen other than nasopharyngeal swab, presence of viral mutation(s) within the areas targeted by this assay, and inadequate number of viral copies (<250 copies / mL). A negative result must be combined with clinical observations, patient history, and epidemiological information.  Fact Sheet for Patients:   StrictlyIdeas.no  Fact Sheet for Healthcare Providers: BankingDealers.co.za  This test is not yet approved or  cleared by the [place] FDA and has been authorized for detection and/or diagnosis of SARS-CoV-2 by FDA under an Emergency Use Authorization (EUA).  This EUA will remain in effect (meaning this test can be used) for the duration of the COVID-19 declaration under Section 564(b)(1) of the Act, 21 U.S.C. section 360bbb-3(b)(1), unless the  authorization is terminated or revoked sooner.  Performed at [facility], [address], [place], [place] [address]      Labs: BNP (last 3 results) No results for input(s): BNP in the last 8760 hours. Basic Metabolic Panel: Recent Labs  Lab 08/01/19 0643 08/01/19 0643 08/02/19 0852 08/03/19 0604 08/04/19 0556 08/05/19 0541 08/06/19 0502  NA 136   < > 130* 132* 136 139 140  K 4.7   < > 4.4 3.9 3.8 4.0 3.9  CL 101   < > 99 100 102 105 105  CO2 20*   < > 19* 17* 21* 21* 21*  GLUCOSE 147*   < > 214* 207* 185* 170* 179*  BUN 34*   < > 42* 38* 37* 36*  30*  CREATININE 3.75*   < > 3.61* 2.99* 2.39* 2.16* 1.98*  CALCIUM 8.1*   < > 7.8* 8.2* 8.5* 8.7* 8.8*  MG 1.8  --   --   --   --   --   --   PHOS  --   --   --   --  2.3* 3.1 3.5   < > = values in this interval not displayed.   Liver Function Tests: Recent Labs  Lab 07/30/19 1535 07/30/19 1535 07/31/19 0950 07/31/19 0950 08/01/19 0643 08/01/19 0643 08/02/19 0852 08/03/19 0604 08/04/19 0556 08/05/19 0541 08/06/19 0502  AST 19  --  14*  --  81*  --  45* 26  --   --   --   ALT 17  --  16  --  60*  --  50* 39  --   --   --   ALKPHOS 52  --  52  --  44  --  49 51  --   --   --   BILITOT 1.0  --  1.4*  --  0.9  --  0.9 0.7  --   --   --   PROT 7.5  --  7.7  --  6.8  --  7.1 7.1  --   --   --   ALBUMIN 3.8   < > 3.7   < > 3.4*   < > 3.1* 3.0* 3.0* 2.8* 2.8*   < > = values in this interval not displayed.   Recent Labs  Lab 07/30/19 1535 07/31/19 0950  LIPASE 25 20   No results for input(s): AMMONIA in the last 168 hours. CBC: Recent Labs  Lab 07/31/19 0950 07/31/19 0950 08/01/19 0643 08/02/19 0852 08/03/19 0604 08/04/19 0556 08/05/19 0542  WBC 12.1*   < > 9.7 10.5 9.4 8.5 6.7  NEUTROABS 8.5*  --   --   --   --   --   --   HGB 13.5   < > 12.0* 11.5* 11.3* 11.4* 11.3*  HCT 41.1   < > 37.2* 35.9* 34.9* 35.7* 34.9*  MCV 89.3   < > 90.3 90.4 90.6 90.8 89.7  PLT 235   < > 207 215 216 277 275   < > = values in  this interval not displayed.   Cardiac Enzymes: Recent Labs  Lab 08/02/19 0852  CKTOTAL 403*   BNP: Invalid input(s): POCBNP CBG: Recent Labs  Lab 08/05/19 2030 08/06/19 0046 08/06/19 0419 08/06/19 0804 08/06/19 1130  GLUCAP 180* 130* 151* 161* 212*   D-Dimer No results for input(s): DDIMER in the last 72 hours. Hgb A1c No results for input(s): HGBA1C in the last 72 hours. Lipid Profile No results for input(s): CHOL, HDL, LDLCALC, TRIG, CHOLHDL, LDLDIRECT in the last 72 hours. Thyroid function studies No results for input(s): TSH, T4TOTAL, T3FREE, THYROIDAB in the last 72 hours.  Invalid input(s): FREET3 Anemia work up No results for input(s): VITAMINB12, FOLATE, FERRITIN, TIBC, IRON, RETICCTPCT in the last 72 hours. Urinalysis    Component Value Date/Time   COLORURINE YELLOW 07/31/2019 1016   APPEARANCEUR HAZY (A) 07/31/2019 1016   LABSPEC 1.022 07/31/2019 1016   PHURINE 5.0 07/31/2019 1016   GLUCOSEU 50 (A) 07/31/2019 1016   HGBUR SMALL (A) 07/31/2019 1016   BILIRUBINUR NEGATIVE 07/31/2019 1016   KETONESUR NEGATIVE 07/31/2019 1016   PROTEINUR >=300 (A) 07/31/2019 1016   UROBILINOGEN 0.2 07/26/2006 2130   NITRITE NEGATIVE 07/31/2019 1016  LEUKOCYTESUR NEGATIVE 07/31/2019 1016   Sepsis Labs Invalid input(s): PROCALCITONIN,  WBC,  LACTICIDVEN Microbiology Recent Results (from the past 240 hour(s))  SARS Coronavirus 2 by RT PCR (hospital order, performed in [facility] hospital lab) Nasopharyngeal Nasopharyngeal Swab     Status: None   Collection Time: 07/31/19 12:09 PM   Specimen: Nasopharyngeal Swab  Result Value Ref Range Status   SARS Coronavirus 2 NEGATIVE NEGATIVE Final    Comment: (NOTE) SARS-CoV-2 target nucleic acids are NOT DETECTED.  The SARS-CoV-2 RNA is generally detectable in upper and lower respiratory specimens during the acute phase of infection. The lowest concentration of SARS-CoV-2 viral copies this assay can detect is 250 copies / mL.  A negative result does not preclude SARS-CoV-2 infection and should not be used as the sole basis for treatment or other patient management decisions.  A negative result may occur with improper specimen collection / handling, submission of specimen other than nasopharyngeal swab, presence of viral mutation(s) within the areas targeted by this assay, and inadequate number of viral copies (<250 copies / mL). A negative result must be combined with clinical observations, patient history, and epidemiological information.  Fact Sheet for Patients:   StrictlyIdeas.no  Fact Sheet for Healthcare Providers: BankingDealers.co.za  This test is not yet approved or  cleared by the [place] FDA and has been authorized for detection and/or diagnosis of SARS-CoV-2 by FDA under an Emergency Use Authorization (EUA).  This EUA will remain in effect (meaning this test can be used) for the duration of the COVID-19 declaration under Section 564(b)(1) of the Act, 21 U.S.C. section 360bbb-3(b)(1), unless the authorization is terminated or revoked sooner.  Performed at [facility], [address], [place], [place] [address]      Time coordinating discharge: 35 minutes  SIGNED:   Rodena Goldmann, DO [facility] 08/06/2019, 3:07 PM  If 7PM-7AM, please contact night-coverage [email or web address]

## 2019-08-13 ENCOUNTER — Other Ambulatory Visit: Payer: Self-pay

## 2019-08-13 ENCOUNTER — Ambulatory Visit (INDEPENDENT_AMBULATORY_CARE_PROVIDER_SITE_OTHER): Payer: Self-pay | Admitting: General Surgery

## 2019-08-13 ENCOUNTER — Encounter: Payer: Self-pay | Admitting: General Surgery

## 2019-08-13 VITALS — BP 164/91 | HR 79 | Temp 96.6°F | Resp 14 | Ht 73.0 in | Wt 236.0 lb

## 2019-08-13 DIAGNOSIS — I2510 Atherosclerotic heart disease of native coronary artery without angina pectoris: Secondary | ICD-10-CM | POA: Diagnosis not present

## 2019-08-13 DIAGNOSIS — R634 Abnormal weight loss: Secondary | ICD-10-CM | POA: Diagnosis not present

## 2019-08-13 DIAGNOSIS — E6609 Other obesity due to excess calories: Secondary | ICD-10-CM | POA: Diagnosis not present

## 2019-08-13 DIAGNOSIS — Z6832 Body mass index (BMI) 32.0-32.9, adult: Secondary | ICD-10-CM | POA: Diagnosis not present

## 2019-08-13 DIAGNOSIS — E1129 Type 2 diabetes mellitus with other diabetic kidney complication: Secondary | ICD-10-CM | POA: Diagnosis not present

## 2019-08-13 DIAGNOSIS — N179 Acute kidney failure, unspecified: Secondary | ICD-10-CM | POA: Diagnosis not present

## 2019-08-13 DIAGNOSIS — K810 Acute cholecystitis: Secondary | ICD-10-CM | POA: Diagnosis not present

## 2019-08-13 DIAGNOSIS — Z1389 Encounter for screening for other disorder: Secondary | ICD-10-CM | POA: Diagnosis not present

## 2019-08-13 DIAGNOSIS — Z09 Encounter for follow-up examination after completed treatment for conditions other than malignant neoplasm: Secondary | ICD-10-CM

## 2019-08-13 DIAGNOSIS — D649 Anemia, unspecified: Secondary | ICD-10-CM | POA: Diagnosis not present

## 2019-08-13 DIAGNOSIS — R5383 Other fatigue: Secondary | ICD-10-CM | POA: Diagnosis not present

## 2019-08-13 NOTE — Progress Notes (Signed)
Subjective:     Gabriel Schultz  Here for follow-up, status post laparoscopic cholecystectomy and umbilical herniorrhaphy.  Patient doing well.  Denies any nausea or vomiting.  Denies any fever or chills.  Has not been wearing his abdominal binder. Objective:    BP (!) 164/91   Pulse 79   Temp (!) 96.6 F (35.9 C) (Other (Comment))   Resp 14   Ht 6\' 1"  (1.854 m)   Wt 236 lb (107 kg)   SpO2 95%   BMI 31.14 kg/m   General:  alert, cooperative and no distress  Abdomen soft, incisions healing well.  Staples removed, Steri-Strips applied.  Some swelling in the periumbilical region noted, but no drainage present. Final pathology reviewed     Assessment:    Doing well postoperatively.    Plan:   May increase activity as able.  Avoid significant heavy lifting over 20 pounds for the next several weeks.  Follow-up here as needed.

## 2019-08-17 DIAGNOSIS — E119 Type 2 diabetes mellitus without complications: Secondary | ICD-10-CM | POA: Diagnosis not present

## 2019-08-17 DIAGNOSIS — D649 Anemia, unspecified: Secondary | ICD-10-CM | POA: Diagnosis not present

## 2019-08-17 DIAGNOSIS — E1129 Type 2 diabetes mellitus with other diabetic kidney complication: Secondary | ICD-10-CM | POA: Diagnosis not present

## 2019-08-25 ENCOUNTER — Other Ambulatory Visit: Payer: Self-pay

## 2019-08-25 ENCOUNTER — Encounter: Payer: Self-pay | Admitting: Gastroenterology

## 2019-08-25 ENCOUNTER — Ambulatory Visit (INDEPENDENT_AMBULATORY_CARE_PROVIDER_SITE_OTHER): Payer: PPO | Admitting: Gastroenterology

## 2019-08-25 DIAGNOSIS — K2270 Barrett's esophagus without dysplasia: Secondary | ICD-10-CM | POA: Diagnosis not present

## 2019-08-25 DIAGNOSIS — D510 Vitamin B12 deficiency anemia due to intrinsic factor deficiency: Secondary | ICD-10-CM | POA: Diagnosis not present

## 2019-08-25 NOTE — Progress Notes (Signed)
Referring Provider: Ginger Organ Primary Care Physician:  Cory Munch, PA-C Primary GI: Dr. Gala Romney   Chief Complaint  Patient presents with  . Gastroesophageal Reflux    f/u. Doing fine  . Dysphagia    no issue    HPI:   Gabriel Schultz is a 69 y.o. male presenting today with a history of GERD, dysphagia s/p EGD with dilation April 2021 and found to have Barrett's, adenoma with next colonoscopy in 2025. EGD surveillance for Barrett's due in 2024. Here for routine follow-up.   Had cholecystectomy a few weeks ago by Dr. Constance Haw. No dysphagia. Has had some reflux with fish a few days ago. Ate from [facility]. Protonix BID. No abdominal pain. Mowed yard the other day and felt a twinge at one of the incisions. Took a nap and felt better. Overall doing well from a GI standpoint. No overt GI bleeding.     Past Medical History:  Diagnosis Date  . Arthritis   . Coronary artery disease   . Diabetes mellitus without complication ([place])   . GERD (gastroesophageal reflux disease)   . Gout   . High cholesterol   . Hypertension   . Myocardial infarction [facility])    2003    Past Surgical History:  Procedure Laterality Date  . BIOPSY  05/21/2019   Procedure: BIOPSY;  Surgeon: Daneil Dolin, MD;  [facility];  Service: Endoscopy;;  . CHOLECYSTECTOMY N/A 07/31/2019   Procedure: LAPAROSCOPIC CHOLECYSTECTOMY;  Surgeon: Virl Cagey, MD;  Location: [facility] ORS;  Service: General;  Laterality: N/A;  . COLONOSCOPY N/A 11/04/2018   Sigmoid and descending colon diverticulosis, six 5-11 mm polyps in descending and ascending colon, s/p clips. Tubular adenomas. 5 year surveillance  . CORONARY STENT PLACEMENT    . ESOPHAGOGASTRODUODENOSCOPY (EGD) WITH PROPOFOL N/A 05/21/2019    mild incomplete Schatzki ring s/p dilation, duodenal web s/p dilation, s/p esophageal biopsy and gastric biopsy. +Barrett's, negative H.pylori. 3 year surveillance  . HERNIA REPAIR Left    inguinal   . MALONEY DILATION N/A 05/21/2019   Procedure: Venia Minks DILATION;  Surgeon: Daneil Dolin, MD;  [facility];  Service: Endoscopy;  Laterality: N/A;  . POLYPECTOMY  11/04/2018   Procedure: POLYPECTOMY;  Surgeon: Daneil Dolin, MD;  [facility];  Service: Endoscopy;;  . UMBILICAL HERNIA REPAIR  07/31/2019   Procedure: HERNIA REPAIR UMBILICAL ADULT;  Surgeon: Virl Cagey, MD;  Location: [facility] ORS;  Service: General;;    Current Outpatient Medications  Medication Sig Dispense Refill  . allopurinol (ZYLOPRIM) 100 MG tablet Take 100 mg by mouth every other day. In the evening    . ALPRAZolam (XANAX) 0.5 MG tablet Take 0.25-0.5 mg by mouth 3 (three) times daily as needed for anxiety.     [name] amLODipine (NORVASC) 10 MG tablet Take 10 mg by mouth every evening.     [name] aspirin EC 81 MG tablet Take 81 mg by mouth every evening.     . fluticasone (FLONASE) 50 MCG/ACT nasal spray Place 1-2 sprays into both nostrils 2 (two) times daily as needed for allergies or rhinitis.     [name] glipiZIDE (GLUCOTROL XL) 5 MG 24 hr tablet Take 5 mg by mouth every morning.    . methocarbamol (ROBAXIN) 750 MG tablet Take 750 mg by mouth 2 (two) times daily as needed for muscle spasms (back pain).    . metoprolol succinate (TOPROL-XL) 100 MG  24 hr tablet Take 1 tablet (100 mg total) by mouth daily. Take with or immediately following a meal. 30 tablet 3  . MUCINEX MAXIMUM STRENGTH 1200 MG TB12 Take 1,200 mg by mouth daily.     . naproxen (NAPROSYN) 500 MG tablet Take 500 mg by mouth 2 (two) times daily as needed (back pain/neck stiffness).     . pantoprazole (PROTONIX) 40 MG tablet Take 40 mg by mouth 2 (two) times daily.    . phenylephrine-shark liver oil-mineral oil-petrolatum (PREPARATION H) 0.25-3-14-71.9 % rectal ointment Place 1 application rectally 2 (two) times daily as needed for hemorrhoids. 30 g 0  . pravastatin (PRAVACHOL) 20 MG tablet Take 20 mg by mouth every evening.     . sitaGLIPtin  (JANUVIA) 25 MG tablet Take 1 tablet (25 mg total) by mouth daily. 30 tablet 3  . sodium chloride (OCEAN) 0.65 % SOLN nasal spray Place 2 sprays into both nostrils 3 (three) times daily as needed for congestion.    [name] oxyCODONE (OXY IR/ROXICODONE) 5 MG immediate release tablet Take 1-2 tablets (5-10 mg total) by mouth every 6 (six) hours as needed for moderate pain or severe pain. (Patient not taking: Reported on 08/13/2019) 10 tablet 0   No current facility-administered medications for this visit.    Allergies as of 08/25/2019  . (No Known Allergies)    Family History  Problem Relation Age of Onset  . Diabetes Mother   . Diabetes Father     Social History   Socioeconomic History  . Marital status: Married    Spouse name: Not on file  . Number of children: Not on file  . Years of education: Not on file  . Highest education level: Not on file  Occupational History  . Not on file  Tobacco Use  . Smoking status: Former Smoker    Packs/day: 1.00    Years: 30.00    Pack years: 30.00    Types: Cigarettes    Quit date: 02/20/1988    Years since quitting: 31.5  . Smokeless tobacco: Former Systems developer    Types: [place] date: 03/18/2016  Vaping Use  . Vaping Use: Never used  Substance and Sexual Activity  . Alcohol use: Not Currently    Comment: occasionally  . Drug use: No  . Sexual activity: Yes  Other Topics Concern  . Not on file  Social History Narrative  . Not on file   Social Determinants of Health   Financial Resource Strain:   . Difficulty of Paying Living Expenses:   Food Insecurity:   . Worried About Charity fundraiser in the Last Year:   . Arboriculturist in the Last Year:   Transportation Needs:   . Film/video editor (Medical):   [name] Lack of Transportation (Non-Medical):   Physical Activity:   . Days of Exercise per Week:   . Minutes of Exercise per Session:   Stress:   . Feeling of Stress :   Social Connections:   . Frequency of Communication with  Friends and Family:   . Frequency of Social Gatherings with Friends and Family:   . Attends Religious Services:   . Active Member of Clubs or Organizations:   . Attends Archivist Meetings:   [name] Marital Status:     Review of Systems: Gen: Denies fever, chills, anorexia. Denies fatigue, weakness, weight loss.  CV: Denies chest pain, palpitations, syncope, peripheral edema, and claudication. Resp: Denies dyspnea at  rest, cough, wheezing, coughing up blood, and pleurisy. GI: see HPI Derm: Denies rash, itching, dry skin Psych: Denies depression, anxiety, memory loss, confusion. No homicidal or suicidal ideation.  Heme: Denies bruising, bleeding, and enlarged lymph nodes.  Physical Exam: BP (!) 151/85   Pulse 84   Temp (!) 97.1 F (36.2 C) (Oral)   Ht 6\' 1"  (1.854 m)   Wt 238 lb 9.6 oz (108.2 kg)   BMI 31.48 kg/m  General:   Alert and oriented. No distress noted. Pleasant and cooperative.  Head:  Normocephalic and atraumatic. Eyes:  Conjuctiva clear without scleral icterus. Mouth:  Mask in place Abdomen:  +BS, soft, non-tender and non-distended. No rebound or guarding. Steri-strips in place Msk:  Symmetrical without gross deformities. Normal posture. Extremities:  Without edema. Neurologic:  Alert and  oriented x4 Psych:  Alert and cooperative. Normal mood and affect.  ASSESSMENT: Gabriel Schultz is a 69 y.o. male presenting today with history of Barrett's, GERD, dysphagia, recently undergoing cholecystectomy in interim from last visit earlier this year.  GERD controlled overall on Protonix BID. Will have him continue this another month, then decrease to once daily. If this does not control GERD, may increase back to BID.  Dysphagia resolved s/p dilation  PLAN:   Protonix BID through July then decrease to once daily if tolerated  EGD surveillance in 2024  Colonoscopy 2025  Return in 6 months or sooner if needed  Annitta Needs, PhD, [facility]  Gastroenterology

## 2019-08-25 NOTE — Patient Instructions (Addendum)
Continue Protonix twice a day, 30 minutes before breakfast and dinner, through the end of July. Starting in August, you can try it once a day, 30 minutes before breakfast. If this does not control your reflux symptoms, you can go back to twice per day.  I have included a reflux diet sheet for you.  We will see you in 6 months or sooner if needed!  We will repeat an upper endoscopy in 3 years   It was a pleasure to see you today. I want to create trusting relationships with patients to provide genuine, compassionate, and quality care. I value your feedback. If you receive a survey regarding your visit,  I greatly appreciate you taking time to fill this out.   Annitta Needs, PhD, ANP-BC [facility] Gastroenterology    Food Choices for Gastroesophageal Reflux Disease, Adult When you have gastroesophageal reflux disease (GERD), the foods you eat and your eating habits are very important. Choosing the right foods can help ease the discomfort of GERD. Consider working with a diet and nutrition specialist (dietitian) to help you make healthy food choices. What general guidelines should I follow?  Eating plan  Choose healthy foods low in fat, such as fruits, vegetables, whole grains, low-fat dairy products, and lean meat, fish, and poultry.  Eat frequent, small meals instead of three large meals each day. Eat your meals slowly, in a relaxed setting. Avoid bending over or lying down until 2-3 hours after eating.  Limit high-fat foods such as fatty meats or fried foods.  Limit your intake of oils, butter, and shortening to less than 8 teaspoons each day.  Avoid the following: ? Foods that cause symptoms. These may be different for different people. Keep a food diary to keep track of foods that cause symptoms. ? Alcohol. ? Drinking large amounts of liquid with meals. ? Eating meals during the 2-3 hours before bed.  Cook foods using methods other than frying. This may include baking, grilling,  or broiling. Lifestyle  Maintain a healthy weight. Ask your health care provider what weight is healthy for you. If you need to lose weight, work with your health care provider to do so safely.  Exercise for at least 30 minutes on 5 or more days each week, or as told by your health care provider.  Avoid wearing clothes that fit tightly around your waist and chest.  Do not use any products that contain nicotine or tobacco, such as cigarettes and e-cigarettes. If you need help quitting, ask your health care provider.  Sleep with the head of your bed raised. Use a wedge under the mattress or blocks under the bed frame to raise the head of the bed. What foods are not recommended? The items listed may not be a complete list. Talk with your dietitian about what dietary choices are best for you. Grains Pastries or quick breads with added fat. [place] toast. Vegetables Deep fried vegetables. [place] fries. Any vegetables prepared with added fat. Any vegetables that cause symptoms. For some people this may include tomatoes and tomato products, chili peppers, onions and garlic, and horseradish. Fruits Any fruits prepared with added fat. Any fruits that cause symptoms. For some people this may include citrus fruits, such as oranges, grapefruit, pineapple, and lemons. Meats and other protein foods High-fat meats, such as fatty beef or pork, hot dogs, ribs, ham, sausage, salami and bacon. Fried meat or protein, including fried fish and fried chicken. Nuts and nut butters. Dairy Whole milk and chocolate  milk. Sour cream. Cream. Ice cream. Cream cheese. Milk shakes. Beverages Coffee and tea, with or without caffeine. Carbonated beverages. Sodas. Energy drinks. Fruit juice made with acidic fruits (such as orange or grapefruit). Tomato juice. Alcoholic drinks. Fats and oils Butter. Margarine. Shortening. Ghee. Sweets and desserts Chocolate and cocoa. Donuts. Seasoning and other foods Pepper. Peppermint  and spearmint. Any condiments, herbs, or seasonings that cause symptoms. For some people, this may include curry, hot sauce, or vinegar-based salad dressings. Summary  When you have gastroesophageal reflux disease (GERD), food and lifestyle choices are very important to help ease the discomfort of GERD.  Eat frequent, small meals instead of three large meals each day. Eat your meals slowly, in a relaxed setting. Avoid bending over or lying down until 2-3 hours after eating.  Limit high-fat foods such as fatty meat or fried foods. This information is not intended to replace advice given to you by your health care provider. Make sure you discuss any questions you have with your health care provider. Document Revised: 05/29/2018 Document Reviewed: 02/07/2016 Elsevier Patient Education  [place].

## 2019-08-31 DIAGNOSIS — I129 Hypertensive chronic kidney disease with stage 1 through stage 4 chronic kidney disease, or unspecified chronic kidney disease: Secondary | ICD-10-CM | POA: Diagnosis not present

## 2019-08-31 DIAGNOSIS — E1122 Type 2 diabetes mellitus with diabetic chronic kidney disease: Secondary | ICD-10-CM | POA: Diagnosis not present

## 2019-08-31 DIAGNOSIS — N1832 Chronic kidney disease, stage 3b: Secondary | ICD-10-CM | POA: Diagnosis not present

## 2019-08-31 DIAGNOSIS — N179 Acute kidney failure, unspecified: Secondary | ICD-10-CM | POA: Diagnosis not present

## 2019-09-22 DIAGNOSIS — Z1389 Encounter for screening for other disorder: Secondary | ICD-10-CM | POA: Diagnosis not present

## 2019-09-22 DIAGNOSIS — E6609 Other obesity due to excess calories: Secondary | ICD-10-CM | POA: Diagnosis not present

## 2019-09-22 DIAGNOSIS — E1129 Type 2 diabetes mellitus with other diabetic kidney complication: Secondary | ICD-10-CM | POA: Diagnosis not present

## 2019-09-22 DIAGNOSIS — Z6832 Body mass index (BMI) 32.0-32.9, adult: Secondary | ICD-10-CM | POA: Diagnosis not present

## 2019-09-22 DIAGNOSIS — Z0001 Encounter for general adult medical examination with abnormal findings: Secondary | ICD-10-CM | POA: Diagnosis not present

## 2019-09-22 DIAGNOSIS — E119 Type 2 diabetes mellitus without complications: Secondary | ICD-10-CM | POA: Diagnosis not present

## 2019-10-02 DIAGNOSIS — J189 Pneumonia, unspecified organism: Secondary | ICD-10-CM | POA: Diagnosis not present

## 2019-10-02 DIAGNOSIS — E119 Type 2 diabetes mellitus without complications: Secondary | ICD-10-CM | POA: Diagnosis not present

## 2019-10-02 DIAGNOSIS — J8489 Other specified interstitial pulmonary diseases: Secondary | ICD-10-CM | POA: Diagnosis not present

## 2019-10-02 DIAGNOSIS — K219 Gastro-esophageal reflux disease without esophagitis: Secondary | ICD-10-CM | POA: Diagnosis not present

## 2019-12-16 DIAGNOSIS — Z23 Encounter for immunization: Secondary | ICD-10-CM | POA: Diagnosis not present

## 2019-12-16 DIAGNOSIS — Z6833 Body mass index (BMI) 33.0-33.9, adult: Secondary | ICD-10-CM | POA: Diagnosis not present

## 2019-12-16 DIAGNOSIS — I10 Essential (primary) hypertension: Secondary | ICD-10-CM | POA: Diagnosis not present

## 2019-12-16 DIAGNOSIS — I739 Peripheral vascular disease, unspecified: Secondary | ICD-10-CM | POA: Diagnosis not present

## 2019-12-16 DIAGNOSIS — E119 Type 2 diabetes mellitus without complications: Secondary | ICD-10-CM | POA: Diagnosis not present

## 2020-01-22 ENCOUNTER — Other Ambulatory Visit: Payer: Self-pay | Admitting: Internal Medicine

## 2020-02-15 DIAGNOSIS — Z6833 Body mass index (BMI) 33.0-33.9, adult: Secondary | ICD-10-CM | POA: Diagnosis not present

## 2020-02-15 DIAGNOSIS — E6609 Other obesity due to excess calories: Secondary | ICD-10-CM | POA: Diagnosis not present

## 2020-02-15 DIAGNOSIS — J019 Acute sinusitis, unspecified: Secondary | ICD-10-CM | POA: Diagnosis not present

## 2020-02-29 NOTE — Progress Notes (Deleted)
history of GERD, dysphagia s/p EGD with dilation April 2021 and found to have Barrett's, adenoma with next colonoscopy in 2025. EGD surveillance for Barrett's due in 2024. Here for routine follow-up.

## 2020-03-01 ENCOUNTER — Ambulatory Visit: Payer: PPO | Admitting: Gastroenterology

## 2020-03-02 DIAGNOSIS — J329 Chronic sinusitis, unspecified: Secondary | ICD-10-CM | POA: Diagnosis not present

## 2020-03-28 DIAGNOSIS — I129 Hypertensive chronic kidney disease with stage 1 through stage 4 chronic kidney disease, or unspecified chronic kidney disease: Secondary | ICD-10-CM | POA: Diagnosis not present

## 2020-03-28 DIAGNOSIS — N179 Acute kidney failure, unspecified: Secondary | ICD-10-CM | POA: Diagnosis not present

## 2020-03-28 DIAGNOSIS — N1832 Chronic kidney disease, stage 3b: Secondary | ICD-10-CM | POA: Diagnosis not present

## 2020-03-28 DIAGNOSIS — E1122 Type 2 diabetes mellitus with diabetic chronic kidney disease: Secondary | ICD-10-CM | POA: Diagnosis not present

## 2020-05-05 ENCOUNTER — Ambulatory Visit: Payer: PPO | Admitting: Cardiology

## 2020-05-13 ENCOUNTER — Ambulatory Visit: Payer: PPO | Admitting: Family Medicine

## 2020-05-17 DIAGNOSIS — J301 Allergic rhinitis due to pollen: Secondary | ICD-10-CM | POA: Diagnosis not present

## 2020-05-17 DIAGNOSIS — J019 Acute sinusitis, unspecified: Secondary | ICD-10-CM | POA: Diagnosis not present

## 2020-05-17 DIAGNOSIS — Z681 Body mass index (BMI) 19 or less, adult: Secondary | ICD-10-CM | POA: Diagnosis not present

## 2020-05-25 NOTE — Progress Notes (Signed)
Cardiology Office Note  Date: 05/26/2020   ID: Gabriel Schultz, DOB Jan 27, 1951, MRN [id number]  PCP:  Cory Munch, PA-C  Cardiologist:  No primary care provider on file. Electrophysiologist:  None   Chief Complaint: 1 year follow up  History of Present Illness: Gabriel Schultz is a 70 y.o. male with a history of CAD/ MI, DM2, GERD, HLD, HTN.  History of PCI stenting to RCA and LCx 06/03/2008 and 06/29/2008 respectively.  Last seen by Dr. Bronson Ing via telemedicine 03/05/2019.  Sometimes having issues with lower back pain and leg fatigue with walking up hill.  Stated sometimes he had to take a deep breath.  He can walk on level ground indefinitely without chest pain or shortness of breath.  Occasional bilateral ankle swelling but mild and infrequent.  Had issues with "esophageal pain" and abdominal pain.  His CAD was symptomatically stable.  He was continuing aspirin, statin, and beta-blocker.  Continue statin.  Blood pressure was controlled on present therapy.  No changes.  He is here for 1 year follow-up.  Continues to complain of lower back pain and fatigue when walking up hills.  States he is having more issues with shortness of breath when walking more than 125 feet.  States she has occasional mild edema in both lower extremities.  States his diabetes is a little better controlled.  Taking glipizide 5 mg daily, Metformin 1000 mg p.o. twice daily.  And Januvia 25 mg daily.  He states recent hemoglobin A1c at PCP office was less than 7%.  States he weighs more now than he did at last visit.  He states he knows he needs to lose weight.  He denies any anginal symptoms, palpitations or arrhythmias, orthostatic symptoms, CVA or TIA-like symptoms, bleeding issues, PND, orthopnea.  Past Medical History:  Diagnosis Date  . Arthritis   . Coronary artery disease   . Diabetes mellitus without complication ([place])   . GERD (gastroesophageal reflux disease)   . Gout   . High cholesterol   .  Hypertension   . Myocardial infarction [facility])    2003    Past Surgical History:  Procedure Laterality Date  . BIOPSY  05/21/2019   Procedure: BIOPSY;  Surgeon: Daneil Dolin, MD;  [facility];  Service: Endoscopy;;  . CHOLECYSTECTOMY N/A 07/31/2019   Procedure: LAPAROSCOPIC CHOLECYSTECTOMY;  Surgeon: Virl Cagey, MD;  Location: [facility] ORS;  Service: General;  Laterality: N/A;  . COLONOSCOPY N/A 11/04/2018   Sigmoid and descending colon diverticulosis, six 5-11 mm polyps in descending and ascending colon, s/p clips. Tubular adenomas. 5 year surveillance  . CORONARY STENT PLACEMENT    . ESOPHAGOGASTRODUODENOSCOPY (EGD) WITH PROPOFOL N/A 05/21/2019    mild incomplete Schatzki ring s/p dilation, duodenal web s/p dilation, s/p esophageal biopsy and gastric biopsy. +Barrett's, negative H.pylori. 3 year surveillance  . HERNIA REPAIR Left    inguinal  . MALONEY DILATION N/A 05/21/2019   Procedure: Venia Minks DILATION;  Surgeon: Daneil Dolin, MD;  [facility];  Service: Endoscopy;  Laterality: N/A;  . POLYPECTOMY  11/04/2018   Procedure: POLYPECTOMY;  Surgeon: Daneil Dolin, MD;  [facility];  Service: Endoscopy;;  . UMBILICAL HERNIA REPAIR  07/31/2019   Procedure: HERNIA REPAIR UMBILICAL ADULT;  Surgeon: Virl Cagey, MD;  Location: [facility] ORS;  Service: General;;    Current Outpatient Medications  Medication Sig Dispense Refill  . allopurinol (ZYLOPRIM) 100 MG tablet Take 100 mg by mouth every  other day. In the evening    . ALPRAZolam (XANAX) 0.5 MG tablet Take 0.25-0.5 mg by mouth 3 (three) times daily as needed for anxiety.     [name] amLODipine (NORVASC) 10 MG tablet Take 10 mg by mouth every evening.     [name] aspirin EC 81 MG tablet Take 81 mg by mouth every evening.     . fluticasone (FLONASE) 50 MCG/ACT nasal spray Place 1-2 sprays into both nostrils 2 (two) times daily as needed for allergies or rhinitis.     [name] glipiZIDE (GLUCOTROL XL) 5 MG 24 hr tablet Take  5 mg by mouth every morning.    . metFORMIN (GLUCOPHAGE) 1000 MG tablet Take 1 tablet (1,000 mg total) by mouth 2 (two) times daily with a meal.    . methocarbamol (ROBAXIN) 750 MG tablet Take 750 mg by mouth 2 (two) times daily as needed for muscle spasms (back pain).    . metoprolol succinate (TOPROL-XL) 100 MG 24 hr tablet Take 1 tablet (100 mg total) by mouth daily. Take with or immediately following a meal. 30 tablet 3  . MUCINEX MAXIMUM STRENGTH 1200 MG TB12 Take 1,200 mg by mouth daily.     . naproxen (NAPROSYN) 500 MG tablet Take 500 mg by mouth 2 (two) times daily as needed (back pain/neck stiffness).     . pantoprazole (PROTONIX) 40 MG tablet TAKE ONE TABLET BY MOUTH TWICE A DAY 60 tablet 3  . phenylephrine-shark liver oil-mineral oil-petrolatum (PREPARATION H) 0.25-3-14-71.9 % rectal ointment Place 1 application rectally 2 (two) times daily as needed for hemorrhoids. 30 g 0  . pravastatin (PRAVACHOL) 20 MG tablet Take 20 mg by mouth every evening.     . sitaGLIPtin (JANUVIA) 25 MG tablet Take 1 tablet (25 mg total) by mouth daily. 30 tablet 3  . sodium chloride (OCEAN) 0.65 % SOLN nasal spray Place 2 sprays into both nostrils 3 (three) times daily as needed for congestion.     No current facility-administered medications for this visit.   Allergies:  Patient has no known allergies.   Social History: The patient  reports that he quit smoking about 32 years ago. His smoking use included cigarettes. He has a 30.00 pack-year smoking history. He quit smokeless tobacco use about 4 years ago.  His smokeless tobacco use included chew. He reports previous alcohol use. He reports that he does not use drugs.   Family History: The patient's family history includes Diabetes in his father and mother.   ROS:  Please see the history of present illness. Otherwise, complete review of systems is positive for none.  All other systems are reviewed and negative.   Physical Exam: VS:  BP 140/80   Pulse  77   Ht 6\' 1"  (1.854 m)   Wt 256 lb 9.6 oz (116.4 kg)   SpO2 96%   BMI 33.85 kg/m , BMI Body mass index is 33.85 kg/m.  Wt Readings from Last 3 Encounters:  05/26/20 256 lb 9.6 oz (116.4 kg)  08/25/19 238 lb 9.6 oz (108.2 kg)  08/13/19 236 lb (107 kg)    General: Patient appears comfortable at rest. Neck: Supple, no elevated JVP or carotid bruits, no thyromegaly. Lungs: Clear to auscultation, nonlabored breathing at rest. Cardiac: Regular rate and rhythm, no S3 or significant systolic murmur, no pericardial rub. Extremities: No pitting edema, distal pulses 2+. Skin: Warm and dry. Musculoskeletal: No kyphosis. Neuropsychiatric: Alert and oriented x3, affect grossly appropriate.  ECG:  EKG 07/30/2019 normal sinus  rhythm nonspecific T wave abnormality heart rate of 83.  Recent Labwork: 08/01/2019: Magnesium 1.8 08/03/2019: ALT 39; AST 26 08/05/2019: Hemoglobin 11.3; Platelets 275 08/06/2019: BUN 30; Creatinine, Ser 1.98; Potassium 3.9; Sodium 140  No results found for: CHOL, TRIG, HDL, CHOLHDL, VLDL, LDLCALC, LDLDIRECT  Other Studies Reviewed Today:  Nuclear stress testing on 04/24/2010 demonstrated normal myocardial perfusion and inferior wall soft tissue attenuation artifact, calculated LVEF 48%.    Echo 04/25/16:  - Left ventricle: The cavity size was normal. Wall thickness was increased in a pattern of mild LVH. Systolic function was normal. The estimated ejection fraction was in the range of 60% to 65%. Wall motion was normal; there were no regional wall motion abnormalities. Left ventricular diastolic function parameters were normal. - Aortic valve: Mildly to moderately calcified annulus. Trileaflet; mildly thickened leaflets. Valve area (VTI): 2.01 cm^2. Valve area (Vmax): 1.97 cm^2. - Technically adequate study.   Assessment and Plan:  1. CAD in native artery   2. Essential hypertension   3. Hyperlipidemia LDL goal <70   4. DOE (dyspnea on exertion)     1. CAD in native artery History of previous MI 2003.  Previous history of stenting to RCA and LCx 06/03/2008 and 06/29/2008 respectively.  Denies any recent anginal symptoms.  States he does have back pain on exertion.  Continue aspirin 81 mg daily.  Continue Toprol-XL 100 mg p.o. daily.  2. Essential hypertension Blood pressure currently elevated at 140/80.  States his blood pressure at recent PCP visit was 127/80.  States his blood pressures at home when his wife measures them are usually in the 120s over 70s to 52s.  Continue amlodipine 10 mg daily.  Continue Toprol-XL 100 mg daily.  3. Hyperlipidemia LDL goal <70 Continue pravastatin 20 mg daily.  Please get recent lab work from PCP  4. DOE (dyspnea on exertion) Patient states he has had some recent increase in dyspnea on exertion when walking more than 125 feet.  Please get a repeat echocardiogram to reassess LV function, diastolic function, and valvular function.  Medication Adjustments/Labs and Tests Ordered: Current medicines are reviewed at length with the patient today.  Concerns regarding medicines are outlined above.   Disposition: Follow-up with Dr. Harl Bowie or APP 4 to 6 weeks.  Signed, Levell July, NP 05/26/2020 2:49 PM    [facility] at [place], [place], [place] [address] Phone: [phone]; Fax: [phone]

## 2020-05-26 ENCOUNTER — Ambulatory Visit: Payer: PPO | Admitting: Family Medicine

## 2020-05-26 ENCOUNTER — Encounter: Payer: Self-pay | Admitting: Family Medicine

## 2020-05-26 ENCOUNTER — Other Ambulatory Visit: Payer: Self-pay

## 2020-05-26 VITALS — BP 140/80 | HR 77 | Ht 73.0 in | Wt 256.6 lb

## 2020-05-26 DIAGNOSIS — E785 Hyperlipidemia, unspecified: Secondary | ICD-10-CM

## 2020-05-26 DIAGNOSIS — R0609 Other forms of dyspnea: Secondary | ICD-10-CM

## 2020-05-26 DIAGNOSIS — R0600 Dyspnea, unspecified: Secondary | ICD-10-CM

## 2020-05-26 DIAGNOSIS — I10 Essential (primary) hypertension: Secondary | ICD-10-CM | POA: Diagnosis not present

## 2020-05-26 DIAGNOSIS — I2510 Atherosclerotic heart disease of native coronary artery without angina pectoris: Secondary | ICD-10-CM

## 2020-05-26 MED ORDER — METFORMIN HCL 1000 MG PO TABS: 1000.0000 mg | ORAL_TABLET | Freq: Two times a day (BID) | ORAL | Status: DC

## 2020-05-26 NOTE — Patient Instructions (Addendum)
Medication Instructions:  Continue all current medications.  Labwork: none  Testing/Procedures:  Your physician has requested that you have an echocardiogram. Echocardiography is a painless test that uses sound waves to create images of your heart. It provides your doctor with information about the size and shape of your heart and how well your heart's chambers and valves are working. This procedure takes approximately one hour. There are no restrictions for this procedure.  Office will contact with results via phone or letter.    Follow-Up: 4-6 weeks   Any Other Special Instructions Will Be Listed Below (If Applicable).  If you need a refill on your cardiac medications before your next appointment, please call your pharmacy.  

## 2020-06-14 ENCOUNTER — Ambulatory Visit (INDEPENDENT_AMBULATORY_CARE_PROVIDER_SITE_OTHER): Payer: PPO

## 2020-06-14 ENCOUNTER — Other Ambulatory Visit: Payer: Self-pay | Admitting: Nurse Practitioner

## 2020-06-14 DIAGNOSIS — R0600 Dyspnea, unspecified: Secondary | ICD-10-CM | POA: Diagnosis not present

## 2020-06-14 DIAGNOSIS — R0609 Other forms of dyspnea: Secondary | ICD-10-CM

## 2020-06-14 LAB — ECHOCARDIOGRAM COMPLETE
AR max vel: 1.44 cm2
AV Area VTI: 1.81 cm2
AV Area mean vel: 1.47 cm2
AV Mean grad: 7.1 mmHg
AV Peak grad: 14 mmHg
Ao pk vel: 1.87 m/s
Area-P 1/2: 2.71 cm2
Calc EF: 58.9 %
MV M vel: 2.29 m/s
MV Peak grad: 20.9 mmHg
S' Lateral: 2.43 cm
Single Plane A2C EF: 61.1 %
Single Plane A4C EF: 55.1 %

## 2020-06-15 ENCOUNTER — Telehealth: Payer: Self-pay | Admitting: Family Medicine

## 2020-06-15 NOTE — Telephone Encounter (Signed)
Verta Ellen., NP  Laurine Blazer, LPN Please call the patient let him know the echocardiogram showed he has good pumping function of his heart. The left pumping chamber is a little more muscular and stiff than usual. Nothing on the echocardiogram that would significantly contribute to increased dyspnea on exertion.

## 2020-06-15 NOTE — Telephone Encounter (Signed)
Patient is returning a call to [place] regarding his recent echo.

## 2020-06-17 NOTE — Telephone Encounter (Signed)
Pt contacted and verbalized understanding. Patient had no concerns or questions at this time.

## 2020-06-20 ENCOUNTER — Telehealth: Payer: Self-pay | Admitting: Family Medicine

## 2020-06-20 NOTE — Telephone Encounter (Signed)
LM to return call - cx upcoming appt and recall placed for 1 year

## 2020-06-20 NOTE — Telephone Encounter (Signed)
That is fine with me.  Thanks.

## 2020-06-20 NOTE — Telephone Encounter (Signed)
Patient called wanting to verify if he needs to keep the upcoming appointment scheduled for 5/5. He was under the impression that his echo was normal.

## 2020-06-20 NOTE — Telephone Encounter (Signed)
Ok to move his appt out since echo was normal? Was on a year f/u with previous provider

## 2020-06-20 NOTE — Progress Notes (Deleted)
Cardiology Office Note  Date: 06/20/2020   ID: Gabriel Schultz, Gabriel Schultz [birth year], MRN [id number]  PCP:  Cory Munch, PA-C  Cardiologist:  No primary care provider on file. Electrophysiologist:  None   Chief Complaint: 1 year follow up  History of Present Illness: Gabriel Schultz is a 70 y.o. male with a history of CAD/ MI, DM2, GERD, HLD, HTN.  History of PCI stenting to RCA and LCx 06/03/2008 and 06/29/2008 respectively.  Last seen by Dr. Bronson Ing via telemedicine 03/05/2019.  Sometimes having issues with lower back pain and leg fatigue with walking up hill.  Stated sometimes he had to take a deep breath.  He can walk on level ground indefinitely without chest pain or shortness of breath.  Occasional bilateral ankle swelling but mild and infrequent.  Had issues with "esophageal pain" and abdominal pain.  His CAD was symptomatically stable.  He was continuing aspirin, statin, and beta-blocker.  Continue statin.  Blood pressure was controlled on present therapy.  No changes.  He is here for 1 year follow-up.  Continues to complain of lower back pain and fatigue when walking up hills.  States he is having more issues with shortness of breath when walking more than 125 feet.  States she has occasional mild edema in both lower extremities.  States his diabetes is a little better controlled.  Taking glipizide 5 mg daily, Metformin 1000 mg p.o. twice daily.  And Januvia 25 mg daily.  He states recent hemoglobin A1c at PCP office was less than 7%.  States he weighs more now than he did at last visit.  He states he knows he needs to lose weight.  He denies any anginal symptoms, palpitations or arrhythmias, orthostatic symptoms, CVA or TIA-like symptoms, bleeding issues, PND, orthopnea.  Past Medical History:  Diagnosis Date  . Arthritis   . Coronary artery disease   . Diabetes mellitus without complication ([place])   . GERD (gastroesophageal reflux disease)   . Gout   . High cholesterol   .  Hypertension   . Myocardial infarction [facility])    2003    Past Surgical History:  Procedure Laterality Date  . BIOPSY  05/21/2019   Procedure: BIOPSY;  Surgeon: Daneil Dolin, MD;  [facility];  Service: Endoscopy;;  . CHOLECYSTECTOMY N/A 07/31/2019   Procedure: LAPAROSCOPIC CHOLECYSTECTOMY;  Surgeon: Virl Cagey, MD;  Location: [facility] ORS;  Service: General;  Laterality: N/A;  . COLONOSCOPY N/A 11/04/2018   Sigmoid and descending colon diverticulosis, six 5-11 mm polyps in descending and ascending colon, s/p clips. Tubular adenomas. 5 year surveillance  . CORONARY STENT PLACEMENT    . ESOPHAGOGASTRODUODENOSCOPY (EGD) WITH PROPOFOL N/A 05/21/2019    mild incomplete Schatzki ring s/p dilation, duodenal web s/p dilation, s/p esophageal biopsy and gastric biopsy. +Barrett's, negative H.pylori. 3 year surveillance  . HERNIA REPAIR Left    inguinal  . MALONEY DILATION N/A 05/21/2019   Procedure: Venia Minks DILATION;  Surgeon: Daneil Dolin, MD;  [facility];  Service: Endoscopy;  Laterality: N/A;  . POLYPECTOMY  11/04/2018   Procedure: POLYPECTOMY;  Surgeon: Daneil Dolin, MD;  [facility];  Service: Endoscopy;;  . UMBILICAL HERNIA REPAIR  07/31/2019   Procedure: HERNIA REPAIR UMBILICAL ADULT;  Surgeon: Virl Cagey, MD;  Location: [facility];  Service: General;;    Current Outpatient Medications  Medication Sig Dispense Refill  . allopurinol (ZYLOPRIM) 100 MG tablet Take 100 mg by mouth every  other day. In the evening    . ALPRAZolam (XANAX) 0.5 MG tablet Take 0.25-0.5 mg by mouth 3 (three) times daily as needed for anxiety.     [name] amLODipine (NORVASC) 10 MG tablet Take 10 mg by mouth every evening.     [name] aspirin EC 81 MG tablet Take 81 mg by mouth every evening.     . fluticasone (FLONASE) 50 MCG/ACT nasal spray Place 1-2 sprays into both nostrils 2 (two) times daily as needed for allergies or rhinitis.     [name] glipiZIDE (GLUCOTROL XL) 5 MG 24 hr tablet Take  5 mg by mouth every morning.    . metFORMIN (GLUCOPHAGE) 1000 MG tablet Take 1 tablet (1,000 mg total) by mouth 2 (two) times daily with a meal.    . methocarbamol (ROBAXIN) 750 MG tablet Take 750 mg by mouth 2 (two) times daily as needed for muscle spasms (back pain).    . metoprolol succinate (TOPROL-XL) 100 MG 24 hr tablet Take 1 tablet (100 mg total) by mouth daily. Take with or immediately following a meal. 30 tablet 3  . MUCINEX MAXIMUM STRENGTH 1200 MG TB12 Take 1,200 mg by mouth daily.     . naproxen (NAPROSYN) 500 MG tablet Take 500 mg by mouth 2 (two) times daily as needed (back pain/neck stiffness).     . pantoprazole (PROTONIX) 40 MG tablet TAKE ONE TABLET BY MOUTH TWICE A DAY 60 tablet 3  . phenylephrine-shark liver oil-mineral oil-petrolatum (PREPARATION H) 0.25-3-14-71.9 % rectal ointment Place 1 application rectally 2 (two) times daily as needed for hemorrhoids. 30 g 0  . pravastatin (PRAVACHOL) 20 MG tablet Take 20 mg by mouth every evening.     . sitaGLIPtin (JANUVIA) 25 MG tablet Take 1 tablet (25 mg total) by mouth daily. 30 tablet 3  . sodium chloride (OCEAN) 0.65 % SOLN nasal spray Place 2 sprays into both nostrils 3 (three) times daily as needed for congestion.     No current facility-administered medications for this visit.   Allergies:  Patient has no known allergies.   Social History: The patient  reports that he quit smoking about 32 years ago. His smoking use included cigarettes. He has a 30.00 pack-year smoking history. He quit smokeless tobacco use about 4 years ago.  His smokeless tobacco use included chew. He reports previous alcohol use. He reports that he does not use drugs.   Family History: The patient's family history includes Diabetes in his father and mother.   ROS:  Please see the history of present illness. Otherwise, complete review of systems is positive for none.  All other systems are reviewed and negative.   Physical Exam: VS:  There were no vitals  taken for this visit., BMI There is no height or weight on file to calculate BMI.  Wt Readings from Last 3 Encounters:  05/26/20 256 lb 9.6 oz (116.4 kg)  08/25/19 238 lb 9.6 oz (108.2 kg)  08/13/19 236 lb (107 kg)    General: Patient appears comfortable at rest. Neck: Supple, no elevated JVP or carotid bruits, no thyromegaly. Lungs: Clear to auscultation, nonlabored breathing at rest. Cardiac: Regular rate and rhythm, no S3 or significant systolic murmur, no pericardial rub. Extremities: No pitting edema, distal pulses 2+. Skin: Warm and dry. Musculoskeletal: No kyphosis. Neuropsychiatric: Alert and oriented x3, affect grossly appropriate.  ECG:  EKG 07/30/2019 normal sinus rhythm nonspecific T wave abnormality heart rate of 83.  Recent Labwork: 08/01/2019: Magnesium 1.8 08/03/2019: ALT 39; AST  26 08/05/2019: Hemoglobin 11.3; Platelets 275 08/06/2019: BUN 30; Creatinine, Ser 1.98; Potassium 3.9; Sodium 140  No results found for: CHOL, TRIG, HDL, CHOLHDL, VLDL, LDLCALC, LDLDIRECT  Other Studies Reviewed Today:  Echocardiogram 06/14/2020  1. Left ventricular ejection fraction, by estimation, is 60 to 65%. The left ventricle has normal function. The left ventricle has no regional wall motion abnormalities. There is mild left ventricular hypertrophy. Left ventricular diastolic parameters are consistent with Grade I diastolic dysfunction (impaired relaxation). The average left ventricular global longitudinal strain is 17.7 %. The global longitudinal strain is normal. 2. Right ventricular systolic function is normal. The right ventricular size is normal. 3. The mitral valve is normal in structure. No evidence of mitral valve regurgitation. No evidence of mitral stenosis. 4. The aortic valve is tricuspid. Aortic valve regurgitation is not visualized. No aortic stenosis is present. Comparison(s): Echocardiogram done 04/25/16 showed an EF of 65-70%.    Nuclear stress testing on 04/24/2010  demonstrated normal myocardial perfusion and inferior wall soft tissue attenuation artifact, calculated LVEF 48%.    Echo 04/25/16:  - Left ventricle: The cavity size was normal. Wall thickness was increased in a pattern of mild LVH. Systolic function was normal. The estimated ejection fraction was in the range of 60% to 65%. Wall motion was normal; there were no regional wall motion abnormalities. Left ventricular diastolic function parameters were normal. - Aortic valve: Mildly to moderately calcified annulus. Trileaflet; mildly thickened leaflets. Valve area (VTI): 2.01 cm^2. Valve area (Vmax): 1.97 cm^2. - Technically adequate study.   Assessment and Plan:  1. CAD in native artery   2. Essential hypertension   3. Hyperlipidemia LDL goal <70   4. DOE (dyspnea on exertion)    1. CAD in native artery History of previous MI 2003.  Previous history of stenting to RCA and LCx 06/03/2008 and 06/29/2008 respectively.  Denies any recent anginal symptoms.  States he does have back pain on exertion.  Continue aspirin 81 mg daily.  Continue Toprol-XL 100 mg p.o. daily.  2. Essential hypertension Blood pressure currently elevated at 140/80.  States his blood pressure at recent PCP visit was 127/80.  States his blood pressures at home when his wife measures them are usually in the 120s over 70s to 62s.  Continue amlodipine 10 mg daily.  Continue Toprol-XL 100 mg daily.  3. Hyperlipidemia LDL goal <70 Continue pravastatin 20 mg daily.  Please get recent lab work from PCP  4. DOE (dyspnea on exertion) Patient states he has had some recent increase in dyspnea on exertion when walking more than 125 feet.  Please get a repeat echocardiogram to reassess LV function, diastolic function, and valvular function.  Medication Adjustments/Labs and Tests Ordered: Current medicines are reviewed at length with the patient today.  Concerns regarding medicines are outlined above.    Disposition: Follow-up with Dr. Harl Bowie or [facility] 4 to 6 weeks.  Signed, Levell July, NP 06/20/2020 1:24 PM    [facility], [place], [place] [address] Phone: [phone]; Fax: [phone]

## 2020-06-23 ENCOUNTER — Ambulatory Visit: Payer: PPO | Admitting: Family Medicine

## 2020-08-02 DIAGNOSIS — E1165 Type 2 diabetes mellitus with hyperglycemia: Secondary | ICD-10-CM | POA: Diagnosis not present

## 2020-08-02 DIAGNOSIS — Z6834 Body mass index (BMI) 34.0-34.9, adult: Secondary | ICD-10-CM | POA: Diagnosis not present

## 2020-08-02 DIAGNOSIS — K219 Gastro-esophageal reflux disease without esophagitis: Secondary | ICD-10-CM | POA: Diagnosis not present

## 2020-08-02 DIAGNOSIS — I340 Nonrheumatic mitral (valve) insufficiency: Secondary | ICD-10-CM | POA: Diagnosis not present

## 2020-08-02 DIAGNOSIS — E1129 Type 2 diabetes mellitus with other diabetic kidney complication: Secondary | ICD-10-CM | POA: Diagnosis not present

## 2020-08-02 DIAGNOSIS — E7849 Other hyperlipidemia: Secondary | ICD-10-CM | POA: Diagnosis not present

## 2020-08-02 DIAGNOSIS — I10 Essential (primary) hypertension: Secondary | ICD-10-CM | POA: Diagnosis not present

## 2020-08-02 DIAGNOSIS — F419 Anxiety disorder, unspecified: Secondary | ICD-10-CM | POA: Diagnosis not present

## 2020-08-02 DIAGNOSIS — E6609 Other obesity due to excess calories: Secondary | ICD-10-CM | POA: Diagnosis not present

## 2020-08-03 DIAGNOSIS — E1165 Type 2 diabetes mellitus with hyperglycemia: Secondary | ICD-10-CM | POA: Diagnosis not present

## 2020-08-03 DIAGNOSIS — E7849 Other hyperlipidemia: Secondary | ICD-10-CM | POA: Diagnosis not present

## 2020-08-03 DIAGNOSIS — I10 Essential (primary) hypertension: Secondary | ICD-10-CM | POA: Diagnosis not present

## 2020-08-03 DIAGNOSIS — K219 Gastro-esophageal reflux disease without esophagitis: Secondary | ICD-10-CM | POA: Diagnosis not present

## 2020-08-03 DIAGNOSIS — E6609 Other obesity due to excess calories: Secondary | ICD-10-CM | POA: Diagnosis not present

## 2020-08-03 DIAGNOSIS — F419 Anxiety disorder, unspecified: Secondary | ICD-10-CM | POA: Diagnosis not present

## 2020-08-03 DIAGNOSIS — Z6834 Body mass index (BMI) 34.0-34.9, adult: Secondary | ICD-10-CM | POA: Diagnosis not present

## 2020-08-03 DIAGNOSIS — I340 Nonrheumatic mitral (valve) insufficiency: Secondary | ICD-10-CM | POA: Diagnosis not present

## 2020-08-24 DIAGNOSIS — Z0000 Encounter for general adult medical examination without abnormal findings: Secondary | ICD-10-CM | POA: Diagnosis not present

## 2020-08-24 DIAGNOSIS — Z1331 Encounter for screening for depression: Secondary | ICD-10-CM | POA: Diagnosis not present

## 2020-08-24 DIAGNOSIS — Z0001 Encounter for general adult medical examination with abnormal findings: Secondary | ICD-10-CM | POA: Diagnosis not present

## 2020-08-24 DIAGNOSIS — Z1389 Encounter for screening for other disorder: Secondary | ICD-10-CM | POA: Diagnosis not present

## 2020-08-24 DIAGNOSIS — E782 Mixed hyperlipidemia: Secondary | ICD-10-CM | POA: Diagnosis not present

## 2020-08-24 DIAGNOSIS — E6609 Other obesity due to excess calories: Secondary | ICD-10-CM | POA: Diagnosis not present

## 2020-08-24 DIAGNOSIS — Z6834 Body mass index (BMI) 34.0-34.9, adult: Secondary | ICD-10-CM | POA: Diagnosis not present

## 2020-10-03 DIAGNOSIS — N179 Acute kidney failure, unspecified: Secondary | ICD-10-CM | POA: Diagnosis not present

## 2020-10-03 DIAGNOSIS — I129 Hypertensive chronic kidney disease with stage 1 through stage 4 chronic kidney disease, or unspecified chronic kidney disease: Secondary | ICD-10-CM | POA: Diagnosis not present

## 2020-10-03 DIAGNOSIS — E1122 Type 2 diabetes mellitus with diabetic chronic kidney disease: Secondary | ICD-10-CM | POA: Diagnosis not present

## 2020-10-03 DIAGNOSIS — N1832 Chronic kidney disease, stage 3b: Secondary | ICD-10-CM | POA: Diagnosis not present

## 2020-11-01 ENCOUNTER — Other Ambulatory Visit (HOSPITAL_COMMUNITY): Payer: Self-pay | Admitting: Physician Assistant

## 2020-11-01 ENCOUNTER — Other Ambulatory Visit: Payer: Self-pay

## 2020-11-01 ENCOUNTER — Ambulatory Visit (HOSPITAL_COMMUNITY)
Admission: RE | Admit: 2020-11-01 | Discharge: 2020-11-01 | Disposition: A | Discharge status: Home / Self Care | Payer: PPO | Source: Ambulatory Visit | Attending: Physician Assistant | Admitting: Physician Assistant

## 2020-11-01 DIAGNOSIS — R0602 Shortness of breath: Secondary | ICD-10-CM | POA: Diagnosis not present

## 2020-11-01 DIAGNOSIS — R5383 Other fatigue: Secondary | ICD-10-CM | POA: Diagnosis not present

## 2020-11-01 DIAGNOSIS — Z6834 Body mass index (BMI) 34.0-34.9, adult: Secondary | ICD-10-CM | POA: Diagnosis not present

## 2020-11-01 DIAGNOSIS — E6609 Other obesity due to excess calories: Secondary | ICD-10-CM | POA: Diagnosis not present

## 2020-11-01 DIAGNOSIS — Z23 Encounter for immunization: Secondary | ICD-10-CM | POA: Diagnosis not present

## 2020-11-08 ENCOUNTER — Other Ambulatory Visit: Payer: Self-pay | Admitting: Gastroenterology

## 2020-11-09 NOTE — Telephone Encounter (Signed)
Refilling Protonix. Please let patient know he will need to follow-up in the office for additional refills.

## 2020-11-10 ENCOUNTER — Encounter: Payer: Self-pay | Admitting: Internal Medicine

## 2020-12-14 DIAGNOSIS — H5203 Hypermetropia, bilateral: Secondary | ICD-10-CM | POA: Diagnosis not present

## 2020-12-14 DIAGNOSIS — E119 Type 2 diabetes mellitus without complications: Secondary | ICD-10-CM | POA: Diagnosis not present

## 2020-12-20 DIAGNOSIS — J309 Allergic rhinitis, unspecified: Secondary | ICD-10-CM | POA: Diagnosis not present

## 2021-02-11 ENCOUNTER — Telehealth: Payer: Self-pay | Admitting: Emergency Medicine

## 2021-02-11 ENCOUNTER — Encounter: Payer: Self-pay | Admitting: Emergency Medicine

## 2021-02-11 ENCOUNTER — Ambulatory Visit
Admission: EM | Admit: 2021-02-11 | Discharge: 2021-02-11 | Disposition: A | Discharge status: Home / Self Care | Payer: PPO | Attending: Physician Assistant | Admitting: Physician Assistant

## 2021-02-11 ENCOUNTER — Other Ambulatory Visit: Payer: Self-pay

## 2021-02-11 DIAGNOSIS — J069 Acute upper respiratory infection, unspecified: Secondary | ICD-10-CM

## 2021-02-11 MED ORDER — PSEUDOEPH-BROMPHEN-DM 30-2-10 MG/5ML PO SYRP
5.0000 mL | ORAL_SOLUTION | Freq: Four times a day (QID) | ORAL | 0 refills | Status: DC | PRN
Start: 2021-02-11 — End: 2021-02-11

## 2021-02-11 MED ORDER — PSEUDOEPH-BROMPHEN-DM 30-2-10 MG/5ML PO SYRP: 5.0000 mL | ORAL_SOLUTION | Freq: Four times a day (QID) | ORAL | 0 refills | Status: DC | PRN

## 2021-02-11 NOTE — ED Provider Notes (Signed)
[facility]    CSN: [id number] Arrival date & time: 02/11/21  1201      History   Chief Complaint Chief Complaint  Patient presents with   Cough   Nasal Congestion    HPI Gabriel Schultz is a 70 y.o. male.   Pt complains of congestion and cough that started about 2 days ago.  He reports associated fatigue. Denies fever, chills, n/v/d, body aches, shortness of breath. He has taken Mucinex and tylenol with relief.     Past Medical History:  Diagnosis Date   Arthritis    Coronary artery disease    Diabetes mellitus without complication (HCC)    GERD (gastroesophageal reflux disease)    Gout    High cholesterol    Hypertension    Myocardial infarction [facility])    2003    Patient Active Problem List   Diagnosis Date Noted   Barrett's esophagus 08/25/2019   Acute gangrenous cholecystitis 31/51/7616   Umbilical hernia without obstruction and without gangrene    Coronary artery disease of native artery of native heart with stable angina pectoris ([place]) 03/13/2019    Past Surgical History:  Procedure Laterality Date   BIOPSY  05/21/2019   Procedure: BIOPSY;  Surgeon: Daneil Dolin, MD;  [facility];  Service: Endoscopy;;   CHOLECYSTECTOMY N/A 07/31/2019   Procedure: LAPAROSCOPIC CHOLECYSTECTOMY;  Surgeon: Virl Cagey, MD;  Location: [facility];  Service: General;  Laterality: N/A;   COLONOSCOPY N/A 11/04/2018   Sigmoid and descending colon diverticulosis, six 5-11 mm polyps in descending and ascending colon, s/p clips. Tubular adenomas. 5 year surveillance   CORONARY STENT PLACEMENT     ESOPHAGOGASTRODUODENOSCOPY (EGD) WITH PROPOFOL N/A 05/21/2019    mild incomplete Schatzki ring s/p dilation, duodenal web s/p dilation, s/p esophageal biopsy and gastric biopsy. +Barrett's, negative H.pylori. 3 year surveillance   HERNIA REPAIR Left    inguinal   MALONEY DILATION N/A 05/21/2019   Procedure: Venia Minks DILATION;  Surgeon: Daneil Dolin, MD;  [facility];  Service: Endoscopy;  Laterality: N/A;   POLYPECTOMY  11/04/2018   Procedure: POLYPECTOMY;  Surgeon: Daneil Dolin, MD;  [facility];  Service: Endoscopy;;   UMBILICAL HERNIA REPAIR  07/31/2019   Procedure: HERNIA REPAIR UMBILICAL ADULT;  Surgeon: Virl Cagey, MD;  Location: [facility];  Service: General;;       Home Medications    Prior to Admission medications   Medication Sig Start Date End Date Taking? Authorizing Provider  allopurinol (ZYLOPRIM) 100 MG tablet Take 100 mg by mouth every other day. In the evening    [provider]  ALPRAZolam Duanne Moron) 0.5 MG tablet Take 0.25-0.5 mg by mouth 3 (three) times daily as needed for anxiety.  10/28/18   [provider]  amLODipine (NORVASC) 10 MG tablet Take 10 mg by mouth every evening.     [provider]  aspirin EC 81 MG tablet Take 81 mg by mouth every evening.     [provider]  brompheniramine-pseudoephedrine-DM 30-2-10 MG/5ML syrup Take 5 mLs by mouth 4 (four) times daily as needed. 02/11/21   Ward, Lenise Arena, PA-C  fluticasone (FLONASE) 50 MCG/ACT nasal spray Place 1-2 sprays into both nostrils 2 (two) times daily as needed for allergies or rhinitis.     [provider]  glipiZIDE (GLUCOTROL XL) 5 MG 24 hr tablet Take 5 mg by mouth every morning. 11/05/16   [provider]  metFORMIN (  GLUCOPHAGE) 1000 MG tablet Take 1 tablet (1,000 mg total) by mouth 2 (two) times daily with a meal. 05/26/20   Verta Ellen., NP  methocarbamol (ROBAXIN) 750 MG tablet Take 750 mg by mouth 2 (two) times daily as needed for muscle spasms (back pain).    [provider]  metoprolol succinate (TOPROL-XL) 100 MG 24 hr tablet Take 1 tablet (100 mg total) by mouth daily. Take with or immediately following a meal. 08/07/19 09/06/19  Manuella Ghazi, Pratik D, DO  MUCINEX MAXIMUM STRENGTH 1200 MG TB12 Take 1,200 mg by mouth daily.     [provider]  naproxen (NAPROSYN)  500 MG tablet Take 500 mg by mouth 2 (two) times daily as needed (back pain/neck stiffness).     [provider]  pantoprazole (PROTONIX) 40 MG tablet TAKE ONE TABLET BY MOUTH TWICE A DAY 11/09/20   Erenest Rasher, PA-C  phenylephrine-shark liver oil-mineral oil-petrolatum (PREPARATION H) 0.25-3-14-71.9 % rectal ointment Place 1 application rectally 2 (two) times daily as needed for hemorrhoids. 08/06/19   Manuella Ghazi, Pratik D, DO  pravastatin (PRAVACHOL) 20 MG tablet Take 20 mg by mouth every evening.     [provider]  sitaGLIPtin (JANUVIA) 25 MG tablet Take 1 tablet (25 mg total) by mouth daily. 08/06/19 09/05/19  Manuella Ghazi, Pratik D, DO  sodium chloride (OCEAN) 0.65 % SOLN nasal spray Place 2 sprays into both nostrils 3 (three) times daily as needed for congestion.    [provider]    Family History Family History  Problem Relation Age of Onset   Diabetes Mother    Diabetes Father     Social History Social History   Tobacco Use   Smoking status: Former    Packs/day: 1.00    Years: 30.00    Pack years: 30.00    Types: Cigarettes    Quit date: 02/20/1988    Years since quitting: 33.0   Smokeless tobacco: Former    Types: Chew    Quit date: 03/18/2016  Vaping Use   Vaping Use: Never used  Substance Use Topics   Alcohol use: Not Currently    Comment: occasionally   Drug use: No     Allergies   Patient has no known allergies.   Review of Systems Review of Systems   Physical Exam Triage Vital Signs ED Triage Vitals  Enc Vitals Group     BP 02/11/21 1320 (!) 158/55     Pulse Rate 02/11/21 1320 71     Resp 02/11/21 1320 18     Temp 02/11/21 1320 98.1 F (36.7 C)     Temp Source 02/11/21 1320 Oral     SpO2 02/11/21 1320 97 %     Weight --      Height --      Head Circumference --      Peak Flow --      Pain Score 02/11/21 1319 0     Pain Loc --      Pain Edu? --      Excl. in [place]? --    No data found.  Updated Vital Signs BP (!) 158/55     Pulse 71    Temp 98.1 F (36.7 C) (Oral)    Resp 18    SpO2 97%   Visual Acuity Right Eye Distance:   Left Eye Distance:   Bilateral Distance:    Right Eye Near:   Left Eye Near:    Bilateral Near:  Physical Exam   UC Treatments / Results  Labs (all labs ordered are listed, but only abnormal results are displayed) Labs Reviewed  COVID-19, FLU A+B AND RSV    EKG   Radiology No results found.  Procedures Procedures (including critical care time)  Medications Ordered in UC Medications - No data to display  Initial Impression / Assessment and Plan / UC Course  I have reviewed the triage vital signs and the nursing notes.  Pertinent labs & imaging results that were available during my care of the patient were reviewed by me and considered in my medical decision making (see chart for details).     URI, COVID/Flu pending.  Advised supportive home treatment. Cough syrup prescribed. Return precautions discussed.  Final Clinical Impressions(s) / UC Diagnoses   Final diagnoses:  Acute upper respiratory infection     Discharge Instructions      Recommend daily Flonase, continue with Mucinex Take cough syrup as needed Drink plenty of fluids, rest COVID/flu test pending Return if symptoms become worse      ED Prescriptions     Medication Sig Dispense Auth. Provider   brompheniramine-pseudoephedrine-DM 30-2-10 MG/5ML syrup Take 5 mLs by mouth 4 (four) times daily as needed. 120 mL Ward, Lenise Arena, PA-C      PDMP not reviewed this encounter.   Ward, Lenise Arena, PA-C 02/11/21 1556

## 2021-02-11 NOTE — Discharge Instructions (Signed)
Recommend daily Flonase, continue with Mucinex Take cough syrup as needed Drink plenty of fluids, rest COVID/flu test pending Return if symptoms become worse

## 2021-02-11 NOTE — ED Triage Notes (Signed)
Pt is present today with cough, sinus pressure, and nasal congestion. Pt sx started x2 days ago.

## 2021-02-12 LAB — COVID-19, FLU A+B AND RSV
Influenza A, NAA: NOT DETECTED
Influenza B, NAA: NOT DETECTED
RSV, NAA: NOT DETECTED
SARS-CoV-2, NAA: DETECTED — AB

## 2021-03-07 ENCOUNTER — Other Ambulatory Visit: Payer: Self-pay | Admitting: Gastroenterology

## 2021-03-11 NOTE — Telephone Encounter (Signed)
Due for ov.  

## 2021-03-13 ENCOUNTER — Encounter: Payer: Self-pay | Admitting: Internal Medicine

## 2021-03-16 ENCOUNTER — Other Ambulatory Visit: Payer: Self-pay

## 2021-03-16 ENCOUNTER — Encounter: Payer: Self-pay | Admitting: Urology

## 2021-03-16 ENCOUNTER — Ambulatory Visit (INDEPENDENT_AMBULATORY_CARE_PROVIDER_SITE_OTHER): Payer: Medicare Other | Admitting: Urology

## 2021-03-16 VITALS — BP 162/72 | HR 88

## 2021-03-16 DIAGNOSIS — N368 Other specified disorders of urethra: Secondary | ICD-10-CM

## 2021-03-16 DIAGNOSIS — Z125 Encounter for screening for malignant neoplasm of prostate: Secondary | ICD-10-CM | POA: Diagnosis not present

## 2021-03-16 DIAGNOSIS — R351 Nocturia: Secondary | ICD-10-CM

## 2021-03-16 LAB — URINALYSIS, ROUTINE W REFLEX MICROSCOPIC
Bilirubin, UA: NEGATIVE
Glucose, UA: NEGATIVE
Ketones, UA: NEGATIVE
Leukocytes,UA: NEGATIVE
Nitrite, UA: NEGATIVE
Specific Gravity, UA: 1.025 (ref 1.005–1.030)
Urobilinogen, Ur: 0.2 mg/dL (ref 0.2–1.0)
pH, UA: 5.5 (ref 5.0–7.5)

## 2021-03-16 LAB — MICROSCOPIC EXAMINATION
Epithelial Cells (non renal): NONE SEEN /hpf (ref 0–10)
Renal Epithel, UA: NONE SEEN /hpf
WBC, UA: NONE SEEN /hpf (ref 0–5)

## 2021-03-16 NOTE — Progress Notes (Signed)
post void residual= 17 Urological Symptom Review  Patient is experiencing the following symptoms: Frequent urination Hard to postpone urination Get up at night to urinate Trouble starting stream Injury to kidneys/bladder   Review of Systems  Gastrointestinal (upper)  : Indigestion/heartburn  Gastrointestinal (lower) : Negative for lower GI symptoms  Constitutional : Fatigue  Skin: Negative for skin symptoms  Eyes: Negative for eye symptoms  Ear/Nose/Throat : Sinus problems  Hematologic/Lymphatic: Easy bruising  Cardiovascular : Negative for cardiovascular symptoms  Respiratory : Shortness of breath  Endocrine: Negative for endocrine symptoms  Musculoskeletal: Negative for musculoskeletal symptoms  Neurological: Negative for neurological symptoms  Psychologic: Negative for psychiatric symptoms

## 2021-03-16 NOTE — Progress Notes (Signed)
Assessment: 1. Urethral bleeding   2. Nocturia     Plan: PSA today His history is consistent with a episode of urethral bleeding. His urine is negative for blood today. I discussed potential need for further evaluation with upper tract imaging and cystoscopy if he has a recurrence of bleeding or is found to have hematuria.  He understands. Return to office in 2 months.  Chief Complaint:  Chief Complaint  Patient presents with   Hematuria   urethral bleeding    History of Present Illness:  Gabriel Schultz is a 71 y.o. year old male who is seen in consultation from Cory Munch, [place] for evaluation of urethral bleeding.  He noted blood on his underwear in the morning approximately 3 weeks ago.  This happened 1 time.  No further episodes.  No gross hematuria, dysuria, or flank pain.  No hematospermia.  He is not aware of any trauma or significant straining prior to this episode.  He reports baseline urinary symptoms of occasional frequency and urgency, and nocturia 4-5 times.  He does have some hesitancy at night.  He voids with a good stream and feels like he empties his bladder completely. IPSS = 10 today. No recent PSA results.  Renal ultrasound from 6/21 showed no renal masses or obstruction.  He does have a history of tobacco use smoking for approximately 30 years and quitting approximately 30 years ago.  Past Medical History:  Past Medical History:  Diagnosis Date   Arthritis    Coronary artery disease    Diabetes mellitus without complication (HCC)    GERD (gastroesophageal reflux disease)    Gout    High cholesterol    Hypertension    Myocardial infarction [facility])    2003    Past Surgical History:  Past Surgical History:  Procedure Laterality Date   BIOPSY  05/21/2019   Procedure: BIOPSY;  Surgeon: Daneil Dolin, MD;  [facility];  Service: Endoscopy;;   CHOLECYSTECTOMY N/A 07/31/2019   Procedure: LAPAROSCOPIC CHOLECYSTECTOMY;  Surgeon:  Virl Cagey, MD;  Location: [facility];  Service: General;  Laterality: N/A;   COLONOSCOPY N/A 11/04/2018   Sigmoid and descending colon diverticulosis, six 5-11 mm polyps in descending and ascending colon, s/p clips. Tubular adenomas. 5 year surveillance   CORONARY STENT PLACEMENT     ESOPHAGOGASTRODUODENOSCOPY (EGD) WITH PROPOFOL N/A 05/21/2019    mild incomplete Schatzki ring s/p dilation, duodenal web s/p dilation, s/p esophageal biopsy and gastric biopsy. +Barrett's, negative H.pylori. 3 year surveillance   HERNIA REPAIR Left    inguinal   MALONEY DILATION N/A 05/21/2019   Procedure: Venia Minks DILATION;  Surgeon: Daneil Dolin, MD;  [facility];  Service: Endoscopy;  Laterality: N/A;   POLYPECTOMY  11/04/2018   Procedure: POLYPECTOMY;  Surgeon: Daneil Dolin, MD;  [facility];  Service: Endoscopy;;   UMBILICAL HERNIA REPAIR  07/31/2019   Procedure: HERNIA REPAIR UMBILICAL ADULT;  Surgeon: Virl Cagey, MD;  Location: [facility];  Service: General;;    Allergies:  No Known Allergies  Family History:  Family History  Problem Relation Age of Onset   Diabetes Mother    Diabetes Father     Social History:  Social History   Tobacco Use   Smoking status: Former    Packs/day: 1.00    Years: 30.00    Pack years: 30.00    Types: Cigarettes    Quit date: 02/20/1988    Years since  quitting: 33.0   Smokeless tobacco: Former    Types: Chew    Quit date: 03/18/2016  Vaping Use   Vaping Use: Never used  Substance Use Topics   Alcohol use: Not Currently    Comment: occasionally   Drug use: No    Review of symptoms:  Constitutional:  Negative for unexplained weight loss, night sweats, fever, chills ENT:  Negative for nose bleeds, sinus pain, painful swallowing CV:  Negative for chest pain, shortness of breath, exercise intolerance, palpitations, loss of consciousness Resp:  Negative for cough, wheezing, shortness of breath GI:  Negative for nausea,  vomiting, diarrhea, bloody stools GU:  Positives noted in HPI; otherwise negative for  dysuria, urinary incontinence Neuro:  Negative for seizures, poor balance, limb weakness, slurred speech Psych:  Negative for lack of energy, depression, anxiety Endocrine:  Negative for polydipsia, polyuria, symptoms of hypoglycemia (dizziness, hunger, sweating) Hematologic:  Negative for anemia, purpura, petechia, prolonged or excessive bleeding, use of anticoagulants  Allergic:  Negative for difficulty breathing or choking as a result of exposure to anything; no shellfish allergy; no allergic response (rash/itch) to materials, foods  Physical exam: BP (!) 162/72    Pulse 88  GENERAL APPEARANCE:  Well appearing, well developed, well nourished, NAD HEENT: Atraumatic, Normocephalic, oropharynx clear. NECK: Supple without lymphadenopathy or thyromegaly. LUNGS: Clear to auscultation bilaterally. HEART: Regular Rate and Rhythm without murmurs, gallops, or rubs. ABDOMEN: Soft, non-tender, No Masses. EXTREMITIES: Moves all extremities well.  Without clubbing, cyanosis, or edema. NEUROLOGIC:  Alert and oriented x 3, normal gait, CN II-XII grossly intact.  MENTAL STATUS:  Appropriate. BACK:  Non-tender to palpation.  No CVAT SKIN:  Warm, dry and intact.   GU: Penis:  uncircumcised Meatus: Normal Scrotum: normal, no masses Testis: normal without masses bilateral Epididymis: normal Prostate: 30 g, NT, no nodules Rectum: Normal tone,  no masses or tenderness   Results: U/A:  0-2 RBC  PVR = 17 ml

## 2021-03-16 NOTE — Addendum Note (Signed)
Addended by: Tyrone Apple on: 03/16/2021 04:34 PM   Modules accepted: Orders

## 2021-03-17 LAB — PSA: Prostate Specific Ag, Serum: 1.8 ng/mL (ref 0.0–4.0)

## 2021-03-17 NOTE — Progress Notes (Signed)
Results mailed 

## 2021-04-30 ENCOUNTER — Other Ambulatory Visit: Payer: Self-pay

## 2021-04-30 ENCOUNTER — Ambulatory Visit
Admission: EM | Admit: 2021-04-30 | Discharge: 2021-04-30 | Disposition: A | Discharge status: Home / Self Care | Payer: Medicare Other | Attending: Family Medicine | Admitting: Family Medicine

## 2021-04-30 ENCOUNTER — Encounter: Payer: Self-pay | Admitting: Emergency Medicine

## 2021-04-30 DIAGNOSIS — J069 Acute upper respiratory infection, unspecified: Secondary | ICD-10-CM

## 2021-04-30 DIAGNOSIS — Z1152 Encounter for screening for COVID-19: Secondary | ICD-10-CM | POA: Diagnosis not present

## 2021-04-30 MED ORDER — MOLNUPIRAVIR EUA 200MG CAPSULE: 4.0000 | ORAL_CAPSULE | Freq: Two times a day (BID) | ORAL | 0 refills | Status: AC

## 2021-04-30 MED ORDER — ALBUTEROL SULFATE HFA 108 (90 BASE) MCG/ACT IN AERS: 1.0000 | INHALATION_SPRAY | Freq: Four times a day (QID) | RESPIRATORY_TRACT | 0 refills | Status: DC | PRN

## 2021-04-30 MED ORDER — PROMETHAZINE-DM 6.25-15 MG/5ML PO SYRP: 5.0000 mL | ORAL_SOLUTION | Freq: Four times a day (QID) | ORAL | 0 refills | Status: DC | PRN

## 2021-04-30 NOTE — ED Triage Notes (Signed)
Pt reports cough, chest congestion since Friday. Pt reports diarrhea and fever last night.  ?

## 2021-05-03 LAB — COVID-19, FLU A+B NAA
Influenza A, NAA: NOT DETECTED
Influenza B, NAA: NOT DETECTED
SARS-CoV-2, NAA: NOT DETECTED

## 2021-05-04 DIAGNOSIS — J22 Unspecified acute lower respiratory infection: Secondary | ICD-10-CM | POA: Diagnosis not present

## 2021-05-04 DIAGNOSIS — R6889 Other general symptoms and signs: Secondary | ICD-10-CM | POA: Diagnosis not present

## 2021-05-05 NOTE — ED Provider Notes (Signed)
?[facility] ? ? ? ?CSN: [id number] ?Arrival date & time: 04/30/21  1432 ? ? ?  ? ?History   ?Chief Complaint ?Chief Complaint  ?Patient presents with  ? Cough  ? ? ?HPI ?Gabriel Schultz is a 71 y.o. male.  ? ?Presenting today with several days of cough, chest congestion, sore throat, fever, chills, fatigue. Denies CP, SOB, abdominal pain, N/V. So far trying OTC fever reducers and cough medication with minimal relief. No known sick contacts. No known chronic pulmonary dz.  ? ? ?Past Medical History:  ?Diagnosis Date  ? Arthritis   ? Coronary artery disease   ? Diabetes mellitus without complication ([place])   ? GERD (gastroesophageal reflux disease)   ? Gout   ? High cholesterol   ? Hypertension   ? Myocardial infarction [facility])   ? 2003  ? ? ?Patient Active Problem List  ? Diagnosis Date Noted  ? Barrett's esophagus 08/25/2019  ? Acute gangrenous cholecystitis 07/31/2019  ? Umbilical hernia without obstruction and without gangrene   ? Coronary artery disease of native artery of native heart with stable angina pectoris ([place]) 03/13/2019  ? ? ?Past Surgical History:  ?Procedure Laterality Date  ? BIOPSY  05/21/2019  ? Procedure: BIOPSY;  Surgeon: Daneil Dolin, MD;  [facility];  Service: Endoscopy;;  ? CHOLECYSTECTOMY N/A 07/31/2019  ? Procedure: LAPAROSCOPIC CHOLECYSTECTOMY;  Surgeon: Virl Cagey, MD;  Location: [facility] OR[facility];  Service: General;  Laterality: N/A;  ? COLONOSCOPY N/A 11/04/2018  ? Sigmoid and descending colon diverticulosis, six 5-11 mm polyps in descending and ascending colon, s/p clips. Tubular adenomas. 5 year surveillance  ? CORONARY STENT PLACEMENT    ? ESOPHAGOGASTRODUODENOSCOPY (EGD) WITH PROPOFOL N/A 05/21/2019  ?  mild incomplete Schatzki ring s/p dilation, duodenal web s/p dilation, s/p esophageal biopsy and gastric biopsy. +Barrett's, negative H.pylori. 3 year surveillance  ? HERNIA REPAIR Left   ? inguinal  ? MALONEY DILATION N/A 05/21/2019  ? Procedure: MALONEY DILATION;   Surgeon: Daneil Dolin, MD;  [facility];  Service: Endoscopy;  Laterality: N/A;  ? POLYPECTOMY  11/04/2018  ? Procedure: POLYPECTOMY;  Surgeon: Daneil Dolin, MD;  [facility];  Service: Endoscopy;;  ? UMBILICAL HERNIA REPAIR  07/31/2019  ? Procedure: HERNIA REPAIR UMBILICAL ADULT;  Surgeon: Virl Cagey, MD;  Location: [facility];  Service: General;;  ? ? ? ? ? ?Home Medications   ? ?Prior to Admission medications   ?Medication Sig Start Date End Date Taking? Authorizing Provider  ?albuterol (VENTOLIN HFA) 108 (90 Base) MCG/ACT inhaler Inhale 1-2 puffs into the lungs every 6 (six) hours as needed for wheezing or shortness of breath. 04/30/21  Yes Volney American, PA-C  ?molnupiravir EUA (LAGEVRIO) 200 mg CAPS capsule Take 4 capsules (800 mg total) by mouth 2 (two) times daily for 5 days. 04/30/21 05/05/21 Yes Volney American, PA-C  ?promethazine-dextromethorphan (PROMETHAZINE-DM) 6.25-15 MG/5ML syrup Take 5 mLs by mouth 4 (four) times daily as needed. 04/30/21  Yes Volney American, PA-C  ?allopurinol (ZYLOPRIM) 100 MG tablet Take 100 mg by mouth every other day. In the evening    [provider]  ?ALPRAZolam Duanne Moron) 0.5 MG tablet Take 0.25-0.5 mg by mouth 3 (three) times daily as needed for anxiety.  10/28/18   [provider]  ?amLODipine (NORVASC) 10 MG tablet Take 10 mg by mouth every evening.     [provider]  ?aspirin EC 81 MG tablet Take  81 mg by mouth every evening.     [provider]  ?brompheniramine-pseudoephedrine-DM 30-2-10 MG/5ML syrup Take 5 mLs by mouth 4 (four) times daily as needed. 02/11/21   Ward, Lenise Arena, PA-C  ?fluticasone (FLONASE) 50 MCG/ACT nasal spray Place 1-2 sprays into both nostrils 2 (two) times daily as needed for allergies or rhinitis.     [provider]  ?glipiZIDE (GLUCOTROL XL) 5 MG 24 hr tablet Take 5 mg by mouth every morning. 11/05/16   [provider]  ?losartan (COZAAR) 25  MG tablet Take 25 mg by mouth daily. 02/27/21   [provider]  ?metFORMIN (GLUCOPHAGE) 1000 MG tablet Take 1 tablet (1,000 mg total) by mouth 2 (two) times daily with a meal. 05/26/20   Verta Ellen., NP  ?methocarbamol (ROBAXIN) 750 MG tablet Take 750 mg by mouth 2 (two) times daily as needed for muscle spasms (back pain).    [provider]  ?metoprolol succinate (TOPROL-XL) 50 MG 24 hr tablet Take 50 mg by mouth daily. 03/07/21   [provider]  ?MUCINEX MAXIMUM STRENGTH 1200 MG TB12 Take 1,200 mg by mouth daily.     [provider]  ?naproxen (NAPROSYN) 500 MG tablet Take 500 mg by mouth 2 (two) times daily as needed (back pain/neck stiffness).     [provider]  ?pantoprazole (PROTONIX) 40 MG tablet TAKE ONE TABLET BY MOUTH TWICE A DAY 03/11/21   Mahala Menghini, PA-C  ?phenylephrine-shark liver oil-mineral oil-petrolatum (PREPARATION H) 0.25-3-14-71.9 % rectal ointment Place 1 application rectally 2 (two) times daily as needed for hemorrhoids. 08/06/19   Manuella Ghazi, Pratik D, DO  ?pravastatin (PRAVACHOL) 20 MG tablet Take 20 mg by mouth every evening.     [provider]  ?sitaGLIPtin (JANUVIA) 25 MG tablet Take 1 tablet (25 mg total) by mouth daily. 08/06/19 09/05/19  Manuella Ghazi, Pratik D, DO  ?sodium chloride (OCEAN) 0.65 % SOLN nasal spray Place 2 sprays into both nostrils 3 (three) times daily as needed for congestion.    [provider]  ? ? ?Family History ?Family History  ?Problem Relation Age of Onset  ? Diabetes Mother   ? Diabetes Father   ? ? ?Social History ?Social History  ? ?Tobacco Use  ? Smoking status: Former  ?  Packs/day: 1.00  ?  Years: 30.00  ?  Pack years: 30.00  ?  Types: Cigarettes  ?  Quit date: 02/20/1988  ?  Years since quitting: 33.2  ? Smokeless tobacco: Former  ?  Types: Chew  ?  Quit date: 03/18/2016  ?Vaping Use  ? Vaping Use: Never used  ?Substance Use Topics  ? Alcohol use: Not Currently  ?  Comment: occasionally  ? Drug use:  No  ? ? ? ?Allergies   ?Patient has no known allergies. ? ? ?Review of Systems ?Review of Systems ?PER HPI ? ?Physical Exam ?Triage Vital Signs ?ED Triage Vitals  ?Enc Vitals Group  ?   BP 04/30/21 1600 127/62  ?   Pulse Rate 04/30/21 1600 90  ?   Resp 04/30/21 1600 18  ?   Temp 04/30/21 1600 99.1 ?F (37.3 ?C)  ?   Temp Source 04/30/21 1600 Oral  ?   SpO2 --   ?   Weight 04/30/21 1601 250 lb (113.4 kg)  ?   Height 04/30/21 1601 '6\' 1"'$  (1.854 m)  ?   Head Circumference --   ?   Peak Flow --   ?  Pain Score 04/30/21 1601 4  ?   Pain Loc --   ?   Pain Edu? --   ?   Excl. in [place]? --   ? ?No data found. ? ?Updated Vital Signs ?BP 127/62 (BP Location: Right Arm)   Pulse 90   Temp 99.1 ?F (37.3 ?C) (Oral)   Resp 18   Ht '6\' 1"'$  (1.854 m)   Wt 250 lb (113.4 kg)   BMI 32.98 kg/m?  ? ?Visual Acuity ?Right Eye Distance:   ?Left Eye Distance:   ?Bilateral Distance:   ? ?Right Eye Near:   ?Left Eye Near:    ?Bilateral Near:    ? ?Physical Exam ?Vitals and nursing note reviewed.  ?Constitutional:   ?   Appearance: He is well-developed.  ?HENT:  ?   Head: Atraumatic.  ?   Right Ear: External ear normal.  ?   Left Ear: External ear normal.  ?   Nose: Rhinorrhea present.  ?   Mouth/Throat:  ?   Pharynx: Posterior oropharyngeal erythema present. No oropharyngeal exudate.  ?Eyes:  ?   Conjunctiva/sclera: Conjunctivae normal.  ?   Pupils: Pupils are equal, round, and reactive to light.  ?Cardiovascular:  ?   Rate and Rhythm: Normal rate and regular rhythm.  ?Pulmonary:  ?   Effort: Pulmonary effort is normal. No respiratory distress.  ?   Breath sounds: Wheezing present. No rales.  ?   Comments: Mild wheezes b/l ?Musculoskeletal:     ?   General: Normal range of motion.  ?   Cervical back: Normal range of motion and neck supple.  ?Lymphadenopathy:  ?   Cervical: No cervical adenopathy.  ?Skin: ?   General: Skin is warm and dry.  ?Neurological:  ?   Mental Status: He is alert and oriented to person, place, and time.  ?Psychiatric:      ?   Behavior: Behavior normal.  ? ? ? ?UC Treatments / Results  ?Labs ?(all labs ordered are listed, but only abnormal results are displayed) ?Labs Reviewed  ?COVID-19, FLU A+B NAA  ? Narrative:   ? Performed a

## 2021-05-11 ENCOUNTER — Ambulatory Visit: Payer: Medicare Other | Admitting: Urology

## 2021-05-11 NOTE — Progress Notes (Deleted)
? ?Assessment: ?1. Urethral bleeding   ?2. Nocturia   ? ? ? ?Plan: ?I discussed potential need for further evaluation with upper tract imaging and cystoscopy if he has a recurrence of bleeding or is found to have hematuria.  He understands. ?Return to office in 2 months. ? ?Chief Complaint:  ?No chief complaint on file. ? ? ?History of Present Illness: ? ?Gabriel Schultz is a 71 y.o. year old male who is seen for further evaluation of urethral bleeding.  He noted blood on his underwear in the morning in January 2023. He had 1 episode without recurrence.  No gross hematuria, dysuria, or flank pain.  No hematospermia.  He is not aware of any trauma or significant straining prior to this episode. ? ?He has baseline urinary symptoms of occasional frequency and urgency, and nocturia 4-5 times.  He does have some hesitancy at night.  He voids with a good stream and feels like he empties his bladder completely. ?IPSS = 10.  PVR = 17 ml ? ?Renal ultrasound from 6/21 showed no renal masses or obstruction. ? ?He does have a history of tobacco use smoking for approximately 30 years and quitting approximately 30 years ago. ? ?PSA 1/23:  1.8 ? ?Portions of the above documentation were copied from a prior visit for review purposes only. ? ? ?Past Medical History:  ?Past Medical History:  ?Diagnosis Date  ? Arthritis   ? Coronary artery disease   ? Diabetes mellitus without complication ([place])   ? GERD (gastroesophageal reflux disease)   ? Gout   ? High cholesterol   ? Hypertension   ? Myocardial infarction [facility])   ? 2003  ? ? ?Past Surgical History:  ?Past Surgical History:  ?Procedure Laterality Date  ? BIOPSY  05/21/2019  ? Procedure: BIOPSY;  Surgeon: Daneil Dolin, MD;  [facility];  Service: Endoscopy;;  ? CHOLECYSTECTOMY N/A 07/31/2019  ? Procedure: LAPAROSCOPIC CHOLECYSTECTOMY;  Surgeon: Virl Cagey, MD;  Location: [facility];  Service: General;  Laterality: N/A;  ? COLONOSCOPY N/A 11/04/2018  ? Sigmoid and  descending colon diverticulosis, six 5-11 mm polyps in descending and ascending colon, s/p clips. Tubular adenomas. 5 year surveillance  ? CORONARY STENT PLACEMENT    ? ESOPHAGOGASTRODUODENOSCOPY (EGD) WITH PROPOFOL N/A 05/21/2019  ?  mild incomplete Schatzki ring s/p dilation, duodenal web s/p dilation, s/p esophageal biopsy and gastric biopsy. +Barrett's, negative H.pylori. 3 year surveillance  ? HERNIA REPAIR Left   ? inguinal  ? MALONEY DILATION N/A 05/21/2019  ? Procedure: MALONEY DILATION;  Surgeon: Daneil Dolin, MD;  [facility];  Service: Endoscopy;  Laterality: N/A;  ? POLYPECTOMY  11/04/2018  ? Procedure: POLYPECTOMY;  Surgeon: Daneil Dolin, MD;  [facility];  Service: Endoscopy;;  ? UMBILICAL HERNIA REPAIR  07/31/2019  ? Procedure: HERNIA REPAIR UMBILICAL ADULT;  Surgeon: Virl Cagey, MD;  Location: [facility];  Service: General;;  ? ? ?Allergies:  ?No Known Allergies ? ?Family History:  ?Family History  ?Problem Relation Age of Onset  ? Diabetes Mother   ? Diabetes Father   ? ? ?Social History:  ?Social History  ? ?Tobacco Use  ? Smoking status: Former  ?  Packs/day: 1.00  ?  Years: 30.00  ?  Pack years: 30.00  ?  Types: Cigarettes  ?  Quit date: 02/20/1988  ?  Years since quitting: 33.2  ? Smokeless tobacco: Former  ?  Types: Chew  ?  Quit date: 03/18/2016  ?Vaping Use  ? Vaping Use: Never used  ?Substance Use Topics  ? Alcohol use: Not Currently  ?  Comment: occasionally  ? Drug use: No  ? ? ?ROS: ?Constitutional:  Negative for fever, chills, weight loss ?CV: Negative for chest pain, previous MI, hypertension ?Respiratory:  Negative for shortness of breath, wheezing, sleep apnea, frequent cough ?GI:  Negative for nausea, vomiting, bloody stool, GERD ? ?Physical exam: ?There were no vitals taken for this visit. ?GENERAL APPEARANCE:  Well appearing, well developed, well nourished, NAD ?HEENT:  Atraumatic, normocephalic, oropharynx clear ?NECK:  Supple without lymphadenopathy or  thyromegaly ?ABDOMEN:  Soft, non-tender, no masses ?EXTREMITIES:  Moves all extremities well, without clubbing, cyanosis, or edema ?NEUROLOGIC:  Alert and oriented x 3, normal gait, CN II-XII grossly intact ?MENTAL STATUS:  appropriate ?BACK:  Non-tender to palpation, No CVAT ?SKIN:  Warm, dry, and intact ? ? ?Results: ?U/A:   ?

## 2021-05-17 ENCOUNTER — Ambulatory Visit: Payer: Medicare Other | Admitting: Urology

## 2021-05-17 NOTE — Progress Notes (Deleted)
? ?Assessment: ?1. Urethral bleeding   ?2. Nocturia   ? ? ? ? ?Plan: ?I discussed potential need for further evaluation with upper tract imaging and cystoscopy if he has a recurrence of bleeding or is found to have hematuria.  He understands. ?Return to office in 2 months. ? ?Chief Complaint:  ?No chief complaint on file. ? ? ?History of Present Illness: ? ?Gabriel Schultz is a 71 y.o. year old male who is seen for further evaluation of urethral bleeding.  He noted blood on his underwear in the morning in January 2023. He had 1 episode without recurrence.  No gross hematuria, dysuria, or flank pain.  No hematospermia.  He is not aware of any trauma or significant straining prior to this episode. ? ?He has baseline urinary symptoms of occasional frequency and urgency, and nocturia 4-5 times.  He does have some hesitancy at night.  He voids with a good stream and feels like he empties his bladder completely. ?IPSS = 10.  PVR = 17 ml ? ?Renal ultrasound from 6/21 showed no renal masses or obstruction. ? ?He does have a history of tobacco use smoking for approximately 30 years and quitting approximately 30 years ago. ? ?PSA 1/23:  1.8 ? ?Portions of the above documentation were copied from a prior visit for review purposes only. ? ? ?Past Medical History:  ?Past Medical History:  ?Diagnosis Date  ? Arthritis   ? Coronary artery disease   ? Diabetes mellitus without complication ([place])   ? GERD (gastroesophageal reflux disease)   ? Gout   ? High cholesterol   ? Hypertension   ? Myocardial infarction [facility])   ? 2003  ? ? ?Past Surgical History:  ?Past Surgical History:  ?Procedure Laterality Date  ? BIOPSY  05/21/2019  ? Procedure: BIOPSY;  Surgeon: Daneil Dolin, MD;  [facility];  Service: Endoscopy;;  ? CHOLECYSTECTOMY N/A 07/31/2019  ? Procedure: LAPAROSCOPIC CHOLECYSTECTOMY;  Surgeon: Virl Cagey, MD;  Location: [facility];  Service: General;  Laterality: N/A;  ? COLONOSCOPY N/A 11/04/2018  ? Sigmoid and  descending colon diverticulosis, six 5-11 mm polyps in descending and ascending colon, s/p clips. Tubular adenomas. 5 year surveillance  ? CORONARY STENT PLACEMENT    ? ESOPHAGOGASTRODUODENOSCOPY (EGD) WITH PROPOFOL N/A 05/21/2019  ?  mild incomplete Schatzki ring s/p dilation, duodenal web s/p dilation, s/p esophageal biopsy and gastric biopsy. +Barrett's, negative H.pylori. 3 year surveillance  ? HERNIA REPAIR Left   ? inguinal  ? MALONEY DILATION N/A 05/21/2019  ? Procedure: MALONEY DILATION;  Surgeon: Daneil Dolin, MD;  [facility];  Service: Endoscopy;  Laterality: N/A;  ? POLYPECTOMY  11/04/2018  ? Procedure: POLYPECTOMY;  Surgeon: Daneil Dolin, MD;  [facility];  Service: Endoscopy;;  ? UMBILICAL HERNIA REPAIR  07/31/2019  ? Procedure: HERNIA REPAIR UMBILICAL ADULT;  Surgeon: Virl Cagey, MD;  Location: [facility];  Service: General;;  ? ? ?Allergies:  ?No Known Allergies ? ?Family History:  ?Family History  ?Problem Relation Age of Onset  ? Diabetes Mother   ? Diabetes Father   ? ? ?Social History:  ?Social History  ? ?Tobacco Use  ? Smoking status: Former  ?  Packs/day: 1.00  ?  Years: 30.00  ?  Pack years: 30.00  ?  Types: Cigarettes  ?  Quit date: 02/20/1988  ?  Years since quitting: 33.2  ? Smokeless tobacco: Former  ?  Types: Chew  ?  Quit date: 03/18/2016  ?Vaping Use  ? Vaping Use: Never used  ?Substance Use Topics  ? Alcohol use: Not Currently  ?  Comment: occasionally  ? Drug use: No  ? ? ?ROS: ?Constitutional:  Negative for fever, chills, weight loss ?CV: Negative for chest pain, previous MI, hypertension ?Respiratory:  Negative for shortness of breath, wheezing, sleep apnea, frequent cough ?GI:  Negative for nausea, vomiting, bloody stool, GERD ? ?Physical exam: ?There were no vitals taken for this visit. ?GENERAL APPEARANCE:  Well appearing, well developed, well nourished, NAD ?HEENT:  Atraumatic, normocephalic, oropharynx clear ?NECK:  Supple without lymphadenopathy or  thyromegaly ?ABDOMEN:  Soft, non-tender, no masses ?EXTREMITIES:  Moves all extremities well, without clubbing, cyanosis, or edema ?NEUROLOGIC:  Alert and oriented x 3, normal gait, CN II-XII grossly intact ?MENTAL STATUS:  appropriate ?BACK:  Non-tender to palpation, No CVAT ?SKIN:  Warm, dry, and intact ? ? ?Results: ?U/A:   ?

## 2021-05-29 DIAGNOSIS — E1122 Type 2 diabetes mellitus with diabetic chronic kidney disease: Secondary | ICD-10-CM | POA: Diagnosis not present

## 2021-05-29 DIAGNOSIS — N179 Acute kidney failure, unspecified: Secondary | ICD-10-CM | POA: Diagnosis not present

## 2021-05-29 DIAGNOSIS — I129 Hypertensive chronic kidney disease with stage 1 through stage 4 chronic kidney disease, or unspecified chronic kidney disease: Secondary | ICD-10-CM | POA: Diagnosis not present

## 2021-05-29 DIAGNOSIS — D649 Anemia, unspecified: Secondary | ICD-10-CM | POA: Diagnosis not present

## 2021-05-29 DIAGNOSIS — N189 Chronic kidney disease, unspecified: Secondary | ICD-10-CM | POA: Diagnosis not present

## 2021-05-29 DIAGNOSIS — N1832 Chronic kidney disease, stage 3b: Secondary | ICD-10-CM | POA: Diagnosis not present

## 2021-05-29 DIAGNOSIS — N390 Urinary tract infection, site not specified: Secondary | ICD-10-CM | POA: Diagnosis not present

## 2021-06-07 ENCOUNTER — Other Ambulatory Visit (HOSPITAL_COMMUNITY): Payer: Self-pay

## 2021-06-09 ENCOUNTER — Encounter (HOSPITAL_COMMUNITY)
Admission: RE | Admit: 2021-06-09 | Discharge: 2021-06-09 | Disposition: A | Discharge status: Home / Self Care | Payer: Medicare Other | Source: Ambulatory Visit | Attending: Nephrology | Admitting: Nephrology

## 2021-06-09 DIAGNOSIS — N189 Chronic kidney disease, unspecified: Secondary | ICD-10-CM | POA: Diagnosis not present

## 2021-06-09 DIAGNOSIS — D631 Anemia in chronic kidney disease: Secondary | ICD-10-CM | POA: Insufficient documentation

## 2021-06-09 MED ORDER — SODIUM CHLORIDE 0.9 % IV SOLN
510.0000 mg | INTRAVENOUS | Status: DC
  Administered 2021-06-09: 510 mg via INTRAVENOUS
  Filled 2021-06-09: qty 510

## 2021-06-16 ENCOUNTER — Encounter (HOSPITAL_COMMUNITY)
Admission: RE | Admit: 2021-06-16 | Discharge: 2021-06-16 | Disposition: A | Discharge status: Home / Self Care | Payer: Medicare Other | Source: Ambulatory Visit | Attending: Nephrology | Admitting: Nephrology

## 2021-06-16 DIAGNOSIS — N189 Chronic kidney disease, unspecified: Secondary | ICD-10-CM | POA: Diagnosis not present

## 2021-06-16 DIAGNOSIS — D631 Anemia in chronic kidney disease: Secondary | ICD-10-CM | POA: Diagnosis not present

## 2021-06-16 MED ORDER — SODIUM CHLORIDE 0.9 % IV SOLN
510.0000 mg | INTRAVENOUS | Status: DC
  Administered 2021-06-16: 510 mg via INTRAVENOUS
  Filled 2021-06-16: qty 510

## 2021-07-03 DIAGNOSIS — J22 Unspecified acute lower respiratory infection: Secondary | ICD-10-CM | POA: Diagnosis not present

## 2021-07-21 ENCOUNTER — Other Ambulatory Visit: Payer: Self-pay | Admitting: Gastroenterology

## 2021-07-24 NOTE — Telephone Encounter (Signed)
Needs office visit prior to further refills.

## 2021-07-25 ENCOUNTER — Encounter: Payer: Self-pay | Admitting: Internal Medicine

## 2021-08-16 DIAGNOSIS — N1832 Chronic kidney disease, stage 3b: Secondary | ICD-10-CM | POA: Diagnosis not present

## 2021-08-21 DIAGNOSIS — I129 Hypertensive chronic kidney disease with stage 1 through stage 4 chronic kidney disease, or unspecified chronic kidney disease: Secondary | ICD-10-CM | POA: Diagnosis not present

## 2021-08-21 DIAGNOSIS — E1122 Type 2 diabetes mellitus with diabetic chronic kidney disease: Secondary | ICD-10-CM | POA: Diagnosis not present

## 2021-08-21 DIAGNOSIS — K219 Gastro-esophageal reflux disease without esophagitis: Secondary | ICD-10-CM | POA: Diagnosis not present

## 2021-08-21 DIAGNOSIS — E872 Acidosis, unspecified: Secondary | ICD-10-CM | POA: Diagnosis not present

## 2021-08-21 DIAGNOSIS — D631 Anemia in chronic kidney disease: Secondary | ICD-10-CM | POA: Diagnosis not present

## 2021-08-21 DIAGNOSIS — N184 Chronic kidney disease, stage 4 (severe): Secondary | ICD-10-CM | POA: Diagnosis not present

## 2021-08-23 ENCOUNTER — Other Ambulatory Visit: Payer: Self-pay | Admitting: Gastroenterology

## 2021-08-23 NOTE — Telephone Encounter (Signed)
Needs visit for further refills; can use virtual if needed.

## 2021-08-24 ENCOUNTER — Encounter: Payer: Self-pay | Admitting: Internal Medicine

## 2021-08-24 ENCOUNTER — Encounter: Payer: Self-pay | Admitting: Cardiology

## 2021-08-24 DIAGNOSIS — N183 Chronic kidney disease, stage 3 unspecified: Secondary | ICD-10-CM | POA: Diagnosis not present

## 2021-08-24 DIAGNOSIS — E1129 Type 2 diabetes mellitus with other diabetic kidney complication: Secondary | ICD-10-CM | POA: Diagnosis not present

## 2021-08-24 DIAGNOSIS — I340 Nonrheumatic mitral (valve) insufficiency: Secondary | ICD-10-CM | POA: Diagnosis not present

## 2021-08-24 DIAGNOSIS — I2510 Atherosclerotic heart disease of native coronary artery without angina pectoris: Secondary | ICD-10-CM | POA: Diagnosis not present

## 2021-08-24 DIAGNOSIS — I10 Essential (primary) hypertension: Secondary | ICD-10-CM | POA: Diagnosis not present

## 2021-08-24 DIAGNOSIS — E782 Mixed hyperlipidemia: Secondary | ICD-10-CM | POA: Diagnosis not present

## 2021-08-24 DIAGNOSIS — E1165 Type 2 diabetes mellitus with hyperglycemia: Secondary | ICD-10-CM | POA: Diagnosis not present

## 2021-08-24 DIAGNOSIS — K219 Gastro-esophageal reflux disease without esophagitis: Secondary | ICD-10-CM | POA: Diagnosis not present

## 2021-08-24 DIAGNOSIS — Z0000 Encounter for general adult medical examination without abnormal findings: Secondary | ICD-10-CM | POA: Diagnosis not present

## 2021-09-14 ENCOUNTER — Encounter: Payer: Self-pay | Admitting: Cardiology

## 2021-09-14 ENCOUNTER — Other Ambulatory Visit: Payer: Self-pay | Admitting: Cardiology

## 2021-09-14 ENCOUNTER — Ambulatory Visit: Payer: Medicare Other | Admitting: Cardiology

## 2021-09-14 ENCOUNTER — Encounter: Payer: Self-pay | Admitting: *Deleted

## 2021-09-14 VITALS — BP 124/76 | HR 69 | Ht 73.0 in | Wt 252.8 lb

## 2021-09-14 DIAGNOSIS — E782 Mixed hyperlipidemia: Secondary | ICD-10-CM

## 2021-09-14 DIAGNOSIS — M79606 Pain in leg, unspecified: Secondary | ICD-10-CM

## 2021-09-14 DIAGNOSIS — I10 Essential (primary) hypertension: Secondary | ICD-10-CM | POA: Diagnosis not present

## 2021-09-14 DIAGNOSIS — I2510 Atherosclerotic heart disease of native coronary artery without angina pectoris: Secondary | ICD-10-CM | POA: Diagnosis not present

## 2021-09-14 DIAGNOSIS — I739 Peripheral vascular disease, unspecified: Secondary | ICD-10-CM

## 2021-09-14 NOTE — Progress Notes (Signed)
Clinical Summary Mr. Stogdill is a 71 y.o.male former patient of Dr Bronson Ing, this is our first visit together  1.CAD - 2010 stenting to the RCA and LCX -05/2020 echo LVEF 60-65%, no WMAs, grade I dd, normal RV  - no chest pains. Some SOB with activities, though also limited by leg aching and weakness with walking.  Can walk 400 feet before symptoms come on. Overall breathing stable since last year when he had his echo - compliant with meds    2. Leg pains - fatigue, aching leg muscles with walking long distance. Resolves with rest   3. HTN - compliant with meds     4. Hyperlipidemia - reports prior side effects on stronger statins. Has tried multiple ones.  - had recently been off statin, just restarted  5.GERD - followed by GI   6. CKD - followed by neprhology, he believes [place] kidney   AAA screen 2017 CT A/P no aneurys  Past Medical History:  Diagnosis Date   Arthritis    Coronary artery disease    Diabetes mellitus without complication (HCC)    GERD (gastroesophageal reflux disease)    Gout    High cholesterol    Hypertension    Myocardial infarction ([place])    2003     No Known Allergies   Current Outpatient Medications  Medication Sig Dispense Refill   albuterol (VENTOLIN HFA) 108 (90 Base) MCG/ACT inhaler Inhale 1-2 puffs into the lungs every 6 (six) hours as needed for wheezing or shortness of breath. 18 g 0   allopurinol (ZYLOPRIM) 100 MG tablet Take 100 mg by mouth every other day. In the evening     ALPRAZolam (XANAX) 0.5 MG tablet Take 0.25-0.5 mg by mouth 3 (three) times daily as needed for anxiety.      amLODipine (NORVASC) 10 MG tablet Take 10 mg by mouth every evening.      aspirin EC 81 MG tablet Take 81 mg by mouth every evening.      brompheniramine-pseudoephedrine-DM 30-2-10 MG/5ML syrup Take 5 mLs by mouth 4 (four) times daily as needed. 120 mL 0   fluticasone (FLONASE) 50 MCG/ACT nasal spray Place 1-2 sprays into both nostrils  2 (two) times daily as needed for allergies or rhinitis.      glipiZIDE (GLUCOTROL XL) 5 MG 24 hr tablet Take 5 mg by mouth every morning.     losartan (COZAAR) 25 MG tablet Take 25 mg by mouth daily.     metFORMIN (GLUCOPHAGE) 1000 MG tablet Take 1 tablet (1,000 mg total) by mouth 2 (two) times daily with a meal.     methocarbamol (ROBAXIN) 750 MG tablet Take 750 mg by mouth 2 (two) times daily as needed for muscle spasms (back pain).     metoprolol succinate (TOPROL-XL) 50 MG 24 hr tablet Take 50 mg by mouth daily.     MUCINEX MAXIMUM STRENGTH 1200 MG TB12 Take 1,200 mg by mouth daily.      naproxen (NAPROSYN) 500 MG tablet Take 500 mg by mouth 2 (two) times daily as needed (back pain/neck stiffness).      pantoprazole (PROTONIX) 40 MG tablet TAKE ONE TABLET BY MOUTH TWICE A DAY 60 tablet 0   phenylephrine-shark liver oil-mineral oil-petrolatum (PREPARATION H) 0.25-3-14-71.9 % rectal ointment Place 1 application rectally 2 (two) times daily as needed for hemorrhoids. 30 g 0   pravastatin (PRAVACHOL) 20 MG tablet Take 20 mg by mouth every evening.      promethazine-dextromethorphan (  PROMETHAZINE-DM) 6.25-15 MG/5ML syrup Take 5 mLs by mouth 4 (four) times daily as needed. 100 mL 0   sitaGLIPtin (JANUVIA) 25 MG tablet Take 1 tablet (25 mg total) by mouth daily. 30 tablet 3   sodium chloride (OCEAN) 0.65 % SOLN nasal spray Place 2 sprays into both nostrils 3 (three) times daily as needed for congestion.     No current facility-administered medications for this visit.     Past Surgical History:  Procedure Laterality Date   BIOPSY  05/21/2019   Procedure: BIOPSY;  Surgeon: Daneil Dolin, MD;  [facility];  Service: Endoscopy;;   CHOLECYSTECTOMY N/A 07/31/2019   Procedure: LAPAROSCOPIC CHOLECYSTECTOMY;  Surgeon: Virl Cagey, MD;  Location: [facility];  Service: General;  Laterality: N/A;   COLONOSCOPY N/A 11/04/2018   Sigmoid and descending colon diverticulosis, six 5-11 mm polyps  in descending and ascending colon, s/p clips. Tubular adenomas. 5 year surveillance   CORONARY STENT PLACEMENT     ESOPHAGOGASTRODUODENOSCOPY (EGD) WITH PROPOFOL N/A 05/21/2019    mild incomplete Schatzki ring s/p dilation, duodenal web s/p dilation, s/p esophageal biopsy and gastric biopsy. +Barrett's, negative H.pylori. 3 year surveillance   HERNIA REPAIR Left    inguinal   MALONEY DILATION N/A 05/21/2019   Procedure: Venia Minks DILATION;  Surgeon: Daneil Dolin, MD;  [facility];  Service: Endoscopy;  Laterality: N/A;   POLYPECTOMY  11/04/2018   Procedure: POLYPECTOMY;  Surgeon: Daneil Dolin, MD;  [facility];  Service: Endoscopy;;   UMBILICAL HERNIA REPAIR  07/31/2019   Procedure: HERNIA REPAIR UMBILICAL ADULT;  Surgeon: Virl Cagey, MD;  Location: [facility];  Service: General;;     No Known Allergies    Family History  Problem Relation Age of Onset   Diabetes Mother    Diabetes Father      Social History Mr. Linarez reports that he quit smoking about 33 years ago. His smoking use included cigarettes. He has a 30.00 pack-year smoking history. He quit smokeless tobacco use about 5 years ago.  His smokeless tobacco use included chew. Mr. Neto reports that he does not currently use alcohol.   Review of Systems CONSTITUTIONAL: No weight loss, fever, chills, weakness or fatigue.  HEENT: Eyes: No visual loss, blurred vision, double vision or yellow sclerae.No hearing loss, sneezing, congestion, runny nose or sore throat.  SKIN: No rash or itching.  CARDIOVASCULAR: per hpi RESPIRATORY: No shortness of breath, cough or sputum.  GASTROINTESTINAL: No anorexia, nausea, vomiting or diarrhea. No abdominal pain or blood.  GENITOURINARY: No burning on urination, no polyuria NEUROLOGICAL: No headache, dizziness, syncope, paralysis, ataxia, numbness or tingling in the extremities. No change in bowel or bladder control.  MUSCULOSKELETAL: per hpi LYMPHATICS: No  enlarged nodes. No history of splenectomy.  PSYCHIATRIC: No history of depression or anxiety.  ENDOCRINOLOGIC: No reports of sweating, cold or heat intolerance. No polyuria or polydipsia.  [name]   Physical Examination Today's Vitals   09/14/21 0947  BP: 124/76  Pulse: 69  SpO2: 98%  Weight: 252 lb 12.8 oz (114.7 kg)  Height: '6\' 1"'$  (1.854 m)   Body mass index is 33.35 kg/m.  Gen: resting comfortably, no acute distress HEENT: no scleral icterus, pupils equal round and reactive, no palptable cervical adenopathy,  CV: RRR, 2/6 systolic mururu rusb, no jvd Resp: Clear to auscultation bilaterally GI: abdomen is soft, non-tender, non-distended, normal bowel sounds, no hepatosplenomegaly MSK: extremities are warm, no edema.  Skin: warm, no rash  Neuro:  no focal deficits Psych: appropriate affect   Diagnostic Studies  Echo 04/25/16:   - Left ventricle: The cavity size was normal. Wall thickness was   increased in a pattern of mild LVH. Systolic function was normal.   The estimated ejection fraction was in the range of 60% to 65%.   Wall motion was normal; there were no regional wall motion   abnormalities. Left ventricular diastolic function parameters   were normal. - Aortic valve: Mildly to moderately calcified annulus. Trileaflet;   mildly thickened leaflets. Valve area (VTI): 2.01 cm^2. Valve   area (Vmax): 1.97 cm^2. - Technically adequate study.   05/2020 echo IMPRESSIONS     1. Left ventricular ejection fraction, by estimation, is 60 to 65%. The  left ventricle has normal function. The left ventricle has no regional  wall motion abnormalities. There is mild left ventricular hypertrophy.  Left ventricular diastolic parameters  are consistent with Grade I diastolic dysfunction (impaired relaxation).  The average left ventricular global longitudinal strain is 17.7 %. The  global longitudinal strain is normal.   2. Right ventricular systolic function is normal. The right  ventricular  size is normal.   3. The mitral valve is normal in structure. No evidence of mitral valve  regurgitation. No evidence of mitral stenosis.   4. The aortic valve is tricuspid. Aortic valve regurgitation is not  visualized. No aortic stenosis is present.     Assessment and Plan  1.CAD - no chest pains. Some SOB but occurs with fairlty high levels of activity and is stable, echo for these symptoms last year was benign - continue current meds - EKG today shows NSR, no ischemic changes  2. Leg pains/weakness - will obtain ABIs  3. HTN - at goal, continue current meds  4. Hyperlipidemia - request labs from pcp, reports he had been off his statin for a period recntly but back on now - prior issues tolerating statins, has done ok on pravastatin.   F/u 6 months    Arnoldo Lenis, M.D.

## 2021-09-14 NOTE — Patient Instructions (Addendum)
Medication Instructions:  Continue all current medications.  Labwork: none  Testing/Procedures: Your physician has requested that you have an ankle brachial index (ABI). During this test an ultrasound and blood pressure cuff are used to evaluate the arteries that supply the arms and legs with blood. Allow thirty minutes for this exam. There are no restrictions or special instructions. Office will contact with results via phone, letter or mychart.     Follow-Up: 6 months   Any Other Special Instructions Will Be Listed Below (If Applicable).   If you need a refill on your cardiac medications before your next appointment, please call your pharmacy.

## 2021-09-18 ENCOUNTER — Ambulatory Visit: Payer: Medicare Other | Admitting: Cardiology

## 2021-09-20 ENCOUNTER — Ambulatory Visit (INDEPENDENT_AMBULATORY_CARE_PROVIDER_SITE_OTHER): Payer: Medicare Other

## 2021-09-20 DIAGNOSIS — I739 Peripheral vascular disease, unspecified: Secondary | ICD-10-CM

## 2021-09-22 ENCOUNTER — Other Ambulatory Visit: Payer: Self-pay | Admitting: Gastroenterology

## 2021-09-27 DIAGNOSIS — E1129 Type 2 diabetes mellitus with other diabetic kidney complication: Secondary | ICD-10-CM | POA: Diagnosis not present

## 2021-09-27 DIAGNOSIS — I2510 Atherosclerotic heart disease of native coronary artery without angina pectoris: Secondary | ICD-10-CM | POA: Diagnosis not present

## 2021-09-27 DIAGNOSIS — N183 Chronic kidney disease, stage 3 unspecified: Secondary | ICD-10-CM | POA: Diagnosis not present

## 2021-10-15 NOTE — Progress Notes (Unsigned)
GI Office Note    Referring Provider: Ginger Organ Primary Care Physician:  Ginger Organ Primary Gastroenterologist: Dr. Gala Romney  Date:  10/15/2021  ID:  Gabriel Schultz, DOB 06-Jun-1950, MRN [id number]   Chief Complaint   No chief complaint on file.    History of Present Illness  Gabriel Schultz is a 71 y.o. male with a history of CAD, diabetes, HTN, HLD, MI, GERD, dysphagia s/p dilation and Berrett's diagnosed in April 2021, with prior cholecystectomy*** presenting today for follow up and medication refill.   Last Colonoscopy September 2020: diverticulosis in sigmoid and descending colon, six 5-11 mm adenomatous polyps in descending and ascending colon with clip placement. Repeat in 2025.   Last EGD April 2021: Mild incomplete Schatzki ring s/p dilation, 1 cm island of salmon colored mucosa in esophagus with biopsy confirmation of Barrett's, multiple benign gastric polyps, and duodenal web s/p dilation. Repeat in 2024.   Last office visit 08/25/2019. Follow up of GERD and Dysphagia and recently diagnosed Barrett's. Denied any issues, had recent cholecystectomy  and was taking protonix 40 mg BID. Advised to go to daily PPI in 1 month but may increase to twice daily if worsening GERD symptoms.   Today:     Current Outpatient Medications  Medication Sig Dispense Refill   albuterol (VENTOLIN HFA) 108 (90 Base) MCG/ACT inhaler Inhale 1-2 puffs into the lungs every 6 (six) hours as needed for wheezing or shortness of breath. 18 g 0   allopurinol (ZYLOPRIM) 100 MG tablet Take 100 mg by mouth daily. In the evening     ALPRAZolam (XANAX) 0.5 MG tablet Take 0.25-0.5 mg by mouth 3 (three) times daily as needed for anxiety.      amLODipine (NORVASC) 10 MG tablet Take 10 mg by mouth every evening.      aspirin EC 81 MG tablet Take 81 mg by mouth every evening.      brompheniramine-pseudoephedrine-DM 30-2-10 MG/5ML syrup Take 5 mLs by mouth 4 (four) times daily as needed.  (Patient not taking: Reported on 09/14/2021) 120 mL 0   fluticasone (FLONASE) 50 MCG/ACT nasal spray Place 1-2 sprays into both nostrils 2 (two) times daily as needed for allergies or rhinitis.      glipiZIDE (GLUCOTROL XL) 5 MG 24 hr tablet Take 5 mg by mouth every morning.     losartan (COZAAR) 25 MG tablet Take 25 mg by mouth daily.     metFORMIN (GLUCOPHAGE) 1000 MG tablet Take 1 tablet (1,000 mg total) by mouth 2 (two) times daily with a meal. (Patient not taking: Reported on 09/14/2021)     methocarbamol (ROBAXIN) 750 MG tablet Take 750 mg by mouth 2 (two) times daily as needed for muscle spasms (back pain).     metoprolol succinate (TOPROL-XL) 50 MG 24 hr tablet Take 50 mg by mouth daily.     MUCINEX MAXIMUM STRENGTH 1200 MG TB12 Take 1,200 mg by mouth daily.  (Patient not taking: Reported on 09/14/2021)     naproxen (NAPROSYN) 500 MG tablet Take 500 mg by mouth 2 (two) times daily as needed (back pain/neck stiffness).      pantoprazole (PROTONIX) 40 MG tablet TAKE ONE TABLET BY MOUTH TWICE A DAY 60 tablet 1   phenylephrine-shark liver oil-mineral oil-petrolatum (PREPARATION H) 0.25-3-14-71.9 % rectal ointment Place 1 application rectally 2 (two) times daily as needed for hemorrhoids. 30 g 0   pravastatin (PRAVACHOL) 20 MG tablet Take 20 mg by mouth every evening.  promethazine-dextromethorphan (PROMETHAZINE-DM) 6.25-15 MG/5ML syrup Take 5 mLs by mouth 4 (four) times daily as needed. (Patient not taking: Reported on 09/14/2021) 100 mL 0   sitaGLIPtin (JANUVIA) 25 MG tablet Take 1 tablet (25 mg total) by mouth daily. 30 tablet 3   sodium chloride (OCEAN) 0.65 % SOLN nasal spray Place 2 sprays into both nostrils 3 (three) times daily as needed for congestion.     No current facility-administered medications for this visit.    Past Medical History:  Diagnosis Date   Arthritis    Coronary artery disease    Diabetes mellitus without complication (HCC)    GERD (gastroesophageal reflux  disease)    Gout    High cholesterol    Hypertension    Myocardial infarction [facility])    2003    Past Surgical History:  Procedure Laterality Date   BIOPSY  05/21/2019   Procedure: BIOPSY;  Surgeon: Daneil Dolin, MD;  [facility];  Service: Endoscopy;;   CHOLECYSTECTOMY N/A 07/31/2019   Procedure: LAPAROSCOPIC CHOLECYSTECTOMY;  Surgeon: Virl Cagey, MD;  Location: [facility];  Service: General;  Laterality: N/A;   COLONOSCOPY N/A 11/04/2018   Sigmoid and descending colon diverticulosis, six 5-11 mm polyps in descending and ascending colon, s/p clips. Tubular adenomas. 5 year surveillance   CORONARY STENT PLACEMENT     ESOPHAGOGASTRODUODENOSCOPY (EGD) WITH PROPOFOL N/A 05/21/2019    mild incomplete Schatzki ring s/p dilation, duodenal web s/p dilation, s/p esophageal biopsy and gastric biopsy. +Barrett's, negative H.pylori. 3 year surveillance   HERNIA REPAIR Left    inguinal   MALONEY DILATION N/A 05/21/2019   Procedure: Venia Minks DILATION;  Surgeon: Daneil Dolin, MD;  [facility];  Service: Endoscopy;  Laterality: N/A;   POLYPECTOMY  11/04/2018   Procedure: POLYPECTOMY;  Surgeon: Daneil Dolin, MD;  [facility];  Service: Endoscopy;;   UMBILICAL HERNIA REPAIR  07/31/2019   Procedure: HERNIA REPAIR UMBILICAL ADULT;  Surgeon: Virl Cagey, MD;  Location: [facility];  Service: General;;    Family History  Problem Relation Age of Onset   Diabetes Mother    Diabetes Father     Allergies as of 10/16/2021   (No Known Allergies)    Social History   Socioeconomic History   Marital status: Married    Spouse name: Not on file   Number of children: Not on file   Years of education: Not on file   Highest education level: Not on file  Occupational History   Not on file  Tobacco Use   Smoking status: Former    Packs/day: 1.00    Years: 30.00    Total pack years: 30.00    Types: Cigarettes    Quit date: 02/20/1988    Years since quitting: 33.6    Smokeless tobacco: Former    Types: Chew    Quit date: 03/18/2016  Vaping Use   Vaping Use: Never used  Substance and Sexual Activity   Alcohol use: Not Currently    Comment: occasionally   Drug use: No   Sexual activity: Yes  Other Topics Concern   Not on file  Social History Narrative   Not on file   Social Determinants of Health   Financial Resource Strain: Not on file  Food Insecurity: Not on file  Transportation Needs: Not on file  Physical Activity: Not on file  Stress: Not on file  Social Connections: Not on file     Review of Systems  Gen: Denies fever, chills, anorexia. Denies fatigue, weakness, weight loss.  CV: Denies chest pain, palpitations, syncope, peripheral edema, and claudication. Resp: Denies dyspnea at rest, cough, wheezing, coughing up blood, and pleurisy. GI: See HPI Derm: Denies rash, itching, dry skin Psych: Denies depression, anxiety, memory loss, confusion. No homicidal or suicidal ideation.  Heme: Denies bruising, bleeding, and enlarged lymph nodes.   Physical Exam   There were no vitals taken for this visit.  General:   Alert and oriented. No distress noted. Pleasant and cooperative.  Head:  Normocephalic and atraumatic. Eyes:  Conjuctiva clear without scleral icterus. Mouth:  Oral mucosa pink and moist. Good dentition. No lesions. Lungs:  Clear to auscultation bilaterally. No wheezes, rales, or rhonchi. No distress.  Heart:  S1, S2 present without murmurs appreciated.  Abdomen:  +BS, soft, non-tender and non-distended. No rebound or guarding. No HSM or masses noted. Rectal: *** Msk:  Symmetrical without gross deformities. Normal posture. Extremities:  Without edema. Neurologic:  Alert and  oriented x4 Psych:  Alert and cooperative. Normal mood and affect.   Assessment  Gabriel Schultz is a 71 y.o. male with a history of CAD, diabetes, HTN, HLD, MI, GERD, dysphagia s/p dilation and Berrett's diagnosed in April 2021, with prior  cholecystectomy*** presenting today for follow up and medication refill.  Barrett's esophagus/GERD:    PLAN   ***     Venetia Night, MSN, FNP-BC, AGACNP-BC [facility]

## 2021-10-16 ENCOUNTER — Ambulatory Visit: Payer: Medicare Other | Admitting: Gastroenterology

## 2021-10-16 ENCOUNTER — Encounter: Payer: Self-pay | Admitting: Gastroenterology

## 2021-10-16 VITALS — BP 154/76 | HR 64 | Temp 97.8°F | Ht 73.0 in | Wt 244.4 lb

## 2021-10-16 DIAGNOSIS — K219 Gastro-esophageal reflux disease without esophagitis: Secondary | ICD-10-CM

## 2021-10-16 DIAGNOSIS — K5900 Constipation, unspecified: Secondary | ICD-10-CM

## 2021-10-16 DIAGNOSIS — K2270 Barrett's esophagus without dysplasia: Secondary | ICD-10-CM | POA: Diagnosis not present

## 2021-10-16 MED ORDER — PANTOPRAZOLE SODIUM 40 MG PO TBEC
40.0000 mg | DELAYED_RELEASE_TABLET | Freq: Every day | ORAL | 11 refills | Status: AC
Start: 2021-10-16 — End: ?

## 2021-10-16 NOTE — Patient Instructions (Signed)
I suspect your constipation is likely due to her increased protein intake.  You should initiate some extra fiber in your diet.  I am giving you a handout regarding Benefiber and when she began taking 2 to 3 teaspoons daily.  If you begin to have more difficulty with your constipation you may use 1 capful of MiraLAX daily.  Continue your exercise as you have been as this usually helps improve constipation.  I sent a refill for your pantoprazole to [facility].  You will begin taking this once daily.  You may begin taking it every morning, if you begin having symptoms in the evenings you may switch to taking it 30 minutes before dinner.  We will have you follow-up in a year or sooner if needed.  If constipation worsens please call us for a visit.  It was a pleasure to see you today. I want to create trusting relationships with patients. If you receive a survey regarding your visit,  I greatly appreciate you taking time to fill this out on paper or through your MyChart. I value your feedback.  Venetia Night, MSN, FNP-BC, AGACNP-BC [facility]

## 2021-11-22 DIAGNOSIS — N184 Chronic kidney disease, stage 4 (severe): Secondary | ICD-10-CM | POA: Diagnosis not present

## 2021-11-27 DIAGNOSIS — E872 Acidosis, unspecified: Secondary | ICD-10-CM | POA: Diagnosis not present

## 2021-11-27 DIAGNOSIS — N184 Chronic kidney disease, stage 4 (severe): Secondary | ICD-10-CM | POA: Diagnosis not present

## 2021-11-27 DIAGNOSIS — K219 Gastro-esophageal reflux disease without esophagitis: Secondary | ICD-10-CM | POA: Diagnosis not present

## 2021-11-27 DIAGNOSIS — I129 Hypertensive chronic kidney disease with stage 1 through stage 4 chronic kidney disease, or unspecified chronic kidney disease: Secondary | ICD-10-CM | POA: Diagnosis not present

## 2021-11-27 DIAGNOSIS — E1122 Type 2 diabetes mellitus with diabetic chronic kidney disease: Secondary | ICD-10-CM | POA: Diagnosis not present

## 2021-11-27 DIAGNOSIS — D631 Anemia in chronic kidney disease: Secondary | ICD-10-CM | POA: Diagnosis not present

## 2021-12-18 DIAGNOSIS — H02834 Dermatochalasis of left upper eyelid: Secondary | ICD-10-CM | POA: Diagnosis not present

## 2021-12-18 DIAGNOSIS — H40023 Open angle with borderline findings, high risk, bilateral: Secondary | ICD-10-CM | POA: Diagnosis not present

## 2021-12-18 DIAGNOSIS — H52203 Unspecified astigmatism, bilateral: Secondary | ICD-10-CM | POA: Diagnosis not present

## 2021-12-18 DIAGNOSIS — H02831 Dermatochalasis of right upper eyelid: Secondary | ICD-10-CM | POA: Diagnosis not present

## 2021-12-18 DIAGNOSIS — E119 Type 2 diabetes mellitus without complications: Secondary | ICD-10-CM | POA: Diagnosis not present

## 2021-12-22 DIAGNOSIS — N184 Chronic kidney disease, stage 4 (severe): Secondary | ICD-10-CM | POA: Diagnosis not present

## 2022-02-26 DIAGNOSIS — I129 Hypertensive chronic kidney disease with stage 1 through stage 4 chronic kidney disease, or unspecified chronic kidney disease: Secondary | ICD-10-CM | POA: Diagnosis not present

## 2022-02-26 DIAGNOSIS — E872 Acidosis, unspecified: Secondary | ICD-10-CM | POA: Diagnosis not present

## 2022-02-26 DIAGNOSIS — K219 Gastro-esophageal reflux disease without esophagitis: Secondary | ICD-10-CM | POA: Diagnosis not present

## 2022-02-26 DIAGNOSIS — N184 Chronic kidney disease, stage 4 (severe): Secondary | ICD-10-CM | POA: Diagnosis not present

## 2022-02-26 DIAGNOSIS — D631 Anemia in chronic kidney disease: Secondary | ICD-10-CM | POA: Diagnosis not present

## 2022-02-26 DIAGNOSIS — E1122 Type 2 diabetes mellitus with diabetic chronic kidney disease: Secondary | ICD-10-CM | POA: Diagnosis not present

## 2022-02-27 ENCOUNTER — Ambulatory Visit: Payer: Medicare Other | Attending: Cardiology | Admitting: Cardiology

## 2022-02-27 ENCOUNTER — Encounter: Payer: Self-pay | Admitting: Cardiology

## 2022-02-27 ENCOUNTER — Encounter: Payer: Self-pay | Admitting: *Deleted

## 2022-02-27 VITALS — BP 145/90 | HR 62 | Ht 73.0 in | Wt 249.0 lb

## 2022-02-27 DIAGNOSIS — E782 Mixed hyperlipidemia: Secondary | ICD-10-CM | POA: Diagnosis not present

## 2022-02-27 DIAGNOSIS — I10 Essential (primary) hypertension: Secondary | ICD-10-CM | POA: Diagnosis not present

## 2022-02-27 DIAGNOSIS — Z79899 Other long term (current) drug therapy: Secondary | ICD-10-CM

## 2022-02-27 DIAGNOSIS — I2510 Atherosclerotic heart disease of native coronary artery without angina pectoris: Secondary | ICD-10-CM

## 2022-02-27 MED ORDER — PRAVASTATIN SODIUM 40 MG PO TABS: 40.0000 mg | ORAL_TABLET | Freq: Every day | ORAL | 3 refills | Status: DC

## 2022-02-27 NOTE — Patient Instructions (Signed)
Medication Instructions:  Increase Pravastatin to '40mg'$  daily  Continue all other medications.     Labwork: FLP - order given today Please do in 8 weeks Reminder:  Nothing to eat or drink after 12 midnight prior to labs. Office will contact with results via phone, letter or mychart.     Testing/Procedures: none  Follow-Up: 6 months   Any Other Special Instructions Will Be Listed Below (If Applicable). Please call with update on blood pressure readings end of the week   If you need a refill on your cardiac medications before your next appointment, please call your pharmacy.

## 2022-02-27 NOTE — Progress Notes (Signed)
Clinical Summary Mr. Luty is a 72 y.o.male seen today for follow up of the following medical problems.   1.CAD - 2010 stenting to the RCA and LCX -05/2020 echo LVEF 60-65%, no WMAs, grade I dd, normal RV     - no chest pains, chronic SOB unchanged with high levels of exertion.  - compliant with meds      2. Leg pains - fatigue, aching leg muscles with walking long distance. Resolves with rest  09/2021 normal ABIs   3. HTN - he is compliant with meds       4. Hyperlipidemia - reports prior side effects on stronger statins. Has tried multiple ones.  - had recently been off statin, just restarted - 08/2021 TC 268 TG 682 HDL 34 LDL 113   5.GERD - followed by GI     6. CKD - followed by neprhology, he believes [place] kidney     AAA screen 2017 CT A/P no aneurysm Past Medical History:  Diagnosis Date   Arthritis    Coronary artery disease    Diabetes mellitus without complication (HCC)    GERD (gastroesophageal reflux disease)    Gout    High cholesterol    Hypertension    Myocardial infarction ([place])    2003     No Known Allergies   Current Outpatient Medications  Medication Sig Dispense Refill   allopurinol (ZYLOPRIM) 100 MG tablet Take 100 mg by mouth daily. In the evening     amLODipine (NORVASC) 10 MG tablet Take 10 mg by mouth every evening.      aspirin EC 81 MG tablet Take 81 mg by mouth every evening.      FARXIGA 10 MG TABS tablet Take 10 mg by mouth daily.     fenofibrate 160 MG tablet Take 160 mg by mouth daily.     fluticasone (FLONASE) 50 MCG/ACT nasal spray Place 1-2 sprays into both nostrils 2 (two) times daily as needed for allergies or rhinitis.      glipiZIDE (GLUCOTROL XL) 5 MG 24 hr tablet Take 5 mg by mouth every morning.     losartan (COZAAR) 50 MG tablet Take 50 mg by mouth daily.     metoprolol succinate (TOPROL-XL) 50 MG 24 hr tablet Take 50 mg by mouth daily.     pantoprazole (PROTONIX) 40 MG tablet Take 1 tablet (40 mg  total) by mouth daily. 30 tablet 11   pravastatin (PRAVACHOL) 20 MG tablet Take 20 mg by mouth every evening.      sodium chloride (OCEAN) 0.65 % SOLN nasal spray Place 2 sprays into both nostrils 3 (three) times daily as needed for congestion.     No current facility-administered medications for this visit.     Past Surgical History:  Procedure Laterality Date   BIOPSY  05/21/2019   Procedure: BIOPSY;  Surgeon: Daneil Dolin, MD;  Location: [facility];  Service: Endoscopy;;   CHOLECYSTECTOMY N/A 07/31/2019   Procedure: LAPAROSCOPIC CHOLECYSTECTOMY;  Surgeon: Virl Cagey, MD;  Location: [facility];  Service: General;  Laterality: N/A;   COLONOSCOPY N/A 11/04/2018   Sigmoid and descending colon diverticulosis, six 5-11 mm polyps in descending and ascending colon, s/p clips. Tubular adenomas. 5 year surveillance   CORONARY STENT PLACEMENT     ESOPHAGOGASTRODUODENOSCOPY (EGD) WITH PROPOFOL N/A 05/21/2019    mild incomplete Schatzki ring s/p dilation, duodenal web s/p dilation, s/p esophageal biopsy and gastric biopsy. +Barrett's, negative H.pylori. 3 year surveillance  HERNIA REPAIR Left    inguinal   MALONEY DILATION N/A 05/21/2019   Procedure: Venia Minks DILATION;  Surgeon: Daneil Dolin, MD;  Location: [facility];  Service: Endoscopy;  Laterality: N/A;   POLYPECTOMY  11/04/2018   Procedure: POLYPECTOMY;  Surgeon: Daneil Dolin, MD;  Location: [facility];  Service: Endoscopy;;   UMBILICAL HERNIA REPAIR  07/31/2019   Procedure: HERNIA REPAIR UMBILICAL ADULT;  Surgeon: Virl Cagey, MD;  Location: [facility];  Service: General;;     No Known Allergies    Family History  Problem Relation Age of Onset   Diabetes Mother    Diabetes Father      Social History Mr. Tomaro reports that he quit smoking about 34 years ago. His smoking use included cigarettes. He has a 30.00 pack-year smoking history. He quit smokeless tobacco use about 5 years ago.  His smokeless tobacco use  included chew. Mr. Roper reports that he does not currently use alcohol.   Review of Systems CONSTITUTIONAL: No weight loss, fever, chills, weakness or fatigue.  HEENT: Eyes: No visual loss, blurred vision, double vision or yellow sclerae.No hearing loss, sneezing, congestion, runny nose or sore throat.  SKIN: No rash or itching.  CARDIOVASCULAR: per hpi RESPIRATORY: No shortness of breath, cough or sputum.  GASTROINTESTINAL: No anorexia, nausea, vomiting or diarrhea. No abdominal pain or blood.  GENITOURINARY: No burning on urination, no polyuria NEUROLOGICAL: No headache, dizziness, syncope, paralysis, ataxia, numbness or tingling in the extremities. No change in bowel or bladder control.  MUSCULOSKELETAL: No muscle, back pain, joint pain or stiffness.  LYMPHATICS: No enlarged nodes. No history of splenectomy.  PSYCHIATRIC: No history of depression or anxiety.  ENDOCRINOLOGIC: No reports of sweating, cold or heat intolerance. No polyuria or polydipsia.  [name]   Physical Examination Today's Vitals   02/27/22 0848  BP: (!) 140/80  Pulse: 62  SpO2: 95%  Weight: 249 lb (112.9 kg)  Height: '6\' 1"'$  (1.854 m)   Body mass index is 32.85 kg/m.  Gen: resting comfortably, no acute distress HEENT: no scleral icterus, pupils equal round and reactive, no palptable cervical adenopathy,  CV: RRR, no m/r/g no jvd Resp: Clear to auscultation bilaterally GI: abdomen is soft, non-tender, non-distended, normal bowel sounds, no hepatosplenomegaly MSK: extremities are warm, no edema.  Skin: warm, no rash Neuro:  no focal deficits Psych: appropriate affect   Diagnostic Studies  Echo 04/25/16:   - Left ventricle: The cavity size was normal. Wall thickness was   increased in a pattern of mild LVH. Systolic function was normal.   The estimated ejection fraction was in the range of 60% to 65%.   Wall motion was normal; there were no regional wall motion   abnormalities. Left ventricular diastolic  function parameters   were normal. - Aortic valve: Mildly to moderately calcified annulus. Trileaflet;   mildly thickened leaflets. Valve area (VTI): 2.01 cm^2. Valve   area (Vmax): 1.97 cm^2. - Technically adequate study.     05/2020 echo IMPRESSIONS     1. Left ventricular ejection fraction, by estimation, is 60 to 65%. The  left ventricle has normal function. The left ventricle has no regional  wall motion abnormalities. There is mild left ventricular hypertrophy.  Left ventricular diastolic parameters  are consistent with Grade I diastolic dysfunction (impaired relaxation).  The average left ventricular global longitudinal strain is 17.7 %. The  global longitudinal strain is normal.   2. Right ventricular systolic function is normal. The right  ventricular  size is normal.   3. The mitral valve is normal in structure. No evidence of mitral valve  regurgitation. No evidence of mitral stenosis.   4. The aortic valve is tricuspid. Aortic valve regurgitation is not  visualized. No aortic stenosis is present.    09/2021 ABI Summary:  Right: Resting right ankle-brachial index is within normal range. No  evidence of significant right lower extremity arterial disease. The right  toe-brachial index is normal.   Left: Resting left ankle-brachial index is within normal range. No  evidence of significant left lower extremity arterial disease. The left  toe-brachial index is normal.   Assessment and Plan   1.CAD - no symptoms, continue current meds    2. HTN -above goal, has been previously well controlled at our prior appointment and also at neprhology appointment just yesterday. He will update [place] with home bp's end of week, if above goal would likely add hydralazine given renal dysfunction   3. Hyperlipidemia - above goal increase pravastatin to '40mg'$  daily, check FLP 8 weeks. - continue fenofibrate   Arnoldo Lenis, M.D.

## 2022-02-27 NOTE — Addendum Note (Signed)
Addended by: Laurine Blazer on: 02/27/2022 09:24 AM   Modules accepted: Orders

## 2022-05-01 ENCOUNTER — Encounter: Payer: Self-pay | Admitting: *Deleted

## 2022-05-24 DIAGNOSIS — I2510 Atherosclerotic heart disease of native coronary artery without angina pectoris: Secondary | ICD-10-CM | POA: Diagnosis not present

## 2022-05-24 DIAGNOSIS — I10 Essential (primary) hypertension: Secondary | ICD-10-CM | POA: Diagnosis not present

## 2022-05-24 DIAGNOSIS — E1129 Type 2 diabetes mellitus with other diabetic kidney complication: Secondary | ICD-10-CM | POA: Diagnosis not present

## 2022-05-24 DIAGNOSIS — N184 Chronic kidney disease, stage 4 (severe): Secondary | ICD-10-CM | POA: Diagnosis not present

## 2022-05-24 DIAGNOSIS — E782 Mixed hyperlipidemia: Secondary | ICD-10-CM | POA: Diagnosis not present

## 2022-06-01 DIAGNOSIS — I10 Essential (primary) hypertension: Secondary | ICD-10-CM | POA: Diagnosis not present

## 2022-06-19 DIAGNOSIS — I10 Essential (primary) hypertension: Secondary | ICD-10-CM | POA: Diagnosis not present

## 2022-06-19 DIAGNOSIS — E1129 Type 2 diabetes mellitus with other diabetic kidney complication: Secondary | ICD-10-CM | POA: Diagnosis not present

## 2022-06-25 DIAGNOSIS — K219 Gastro-esophageal reflux disease without esophagitis: Secondary | ICD-10-CM | POA: Diagnosis not present

## 2022-06-25 DIAGNOSIS — E1122 Type 2 diabetes mellitus with diabetic chronic kidney disease: Secondary | ICD-10-CM | POA: Diagnosis not present

## 2022-06-25 DIAGNOSIS — E872 Acidosis, unspecified: Secondary | ICD-10-CM | POA: Diagnosis not present

## 2022-06-25 DIAGNOSIS — N184 Chronic kidney disease, stage 4 (severe): Secondary | ICD-10-CM | POA: Diagnosis not present

## 2022-06-25 DIAGNOSIS — I129 Hypertensive chronic kidney disease with stage 1 through stage 4 chronic kidney disease, or unspecified chronic kidney disease: Secondary | ICD-10-CM | POA: Diagnosis not present

## 2022-06-25 DIAGNOSIS — D631 Anemia in chronic kidney disease: Secondary | ICD-10-CM | POA: Diagnosis not present

## 2022-08-16 DIAGNOSIS — E1165 Type 2 diabetes mellitus with hyperglycemia: Secondary | ICD-10-CM | POA: Diagnosis not present

## 2022-08-16 DIAGNOSIS — Z0000 Encounter for general adult medical examination without abnormal findings: Secondary | ICD-10-CM | POA: Diagnosis not present

## 2022-08-16 DIAGNOSIS — E1129 Type 2 diabetes mellitus with other diabetic kidney complication: Secondary | ICD-10-CM | POA: Diagnosis not present

## 2022-08-16 DIAGNOSIS — N184 Chronic kidney disease, stage 4 (severe): Secondary | ICD-10-CM | POA: Diagnosis not present

## 2022-08-16 DIAGNOSIS — I10 Essential (primary) hypertension: Secondary | ICD-10-CM | POA: Diagnosis not present

## 2022-08-16 DIAGNOSIS — I2510 Atherosclerotic heart disease of native coronary artery without angina pectoris: Secondary | ICD-10-CM | POA: Diagnosis not present

## 2022-08-28 ENCOUNTER — Other Ambulatory Visit: Payer: Self-pay | Admitting: Nurse Practitioner

## 2022-08-28 DIAGNOSIS — R030 Elevated blood-pressure reading, without diagnosis of hypertension: Secondary | ICD-10-CM | POA: Diagnosis not present

## 2022-08-28 DIAGNOSIS — K4091 Unilateral inguinal hernia, without obstruction or gangrene, recurrent: Secondary | ICD-10-CM | POA: Diagnosis not present

## 2022-08-31 ENCOUNTER — Ambulatory Visit (HOSPITAL_COMMUNITY)
Admission: RE | Admit: 2022-08-31 | Discharge: 2022-08-31 | Disposition: A | Discharge status: Home / Self Care | Payer: Medicare Other | Source: Ambulatory Visit | Attending: Nurse Practitioner | Admitting: Nurse Practitioner

## 2022-08-31 DIAGNOSIS — R1031 Right lower quadrant pain: Secondary | ICD-10-CM | POA: Diagnosis not present

## 2022-08-31 DIAGNOSIS — K4091 Unilateral inguinal hernia, without obstruction or gangrene, recurrent: Secondary | ICD-10-CM | POA: Diagnosis not present

## 2022-09-03 DIAGNOSIS — N189 Chronic kidney disease, unspecified: Secondary | ICD-10-CM | POA: Diagnosis not present

## 2022-09-03 DIAGNOSIS — N184 Chronic kidney disease, stage 4 (severe): Secondary | ICD-10-CM | POA: Diagnosis not present

## 2022-09-03 DIAGNOSIS — N1832 Chronic kidney disease, stage 3b: Secondary | ICD-10-CM | POA: Diagnosis not present

## 2022-09-14 NOTE — Patient Instructions (Signed)

## 2022-09-17 ENCOUNTER — Encounter: Payer: Self-pay | Admitting: Nurse Practitioner

## 2022-09-17 ENCOUNTER — Ambulatory Visit: Payer: Medicare Other | Admitting: Nurse Practitioner

## 2022-09-17 ENCOUNTER — Telehealth: Payer: Self-pay | Admitting: Nurse Practitioner

## 2022-09-17 VITALS — BP 138/70 | HR 71 | Ht 73.0 in | Wt 252.2 lb

## 2022-09-17 DIAGNOSIS — Z7984 Long term (current) use of oral hypoglycemic drugs: Secondary | ICD-10-CM | POA: Diagnosis not present

## 2022-09-17 DIAGNOSIS — E1122 Type 2 diabetes mellitus with diabetic chronic kidney disease: Secondary | ICD-10-CM | POA: Diagnosis not present

## 2022-09-17 DIAGNOSIS — N184 Chronic kidney disease, stage 4 (severe): Secondary | ICD-10-CM

## 2022-09-17 MED ORDER — GLIPIZIDE ER 5 MG PO TB24: 5.0000 mg | ORAL_TABLET | Freq: Every day | ORAL | 3 refills | Status: DC

## 2022-09-17 MED ORDER — ONETOUCH ULTRASOFT LANCETS MISC: 12 refills | Status: DC

## 2022-09-17 MED ORDER — CONTOUR NEXT TEST VI STRP: ORAL_STRIP | 12 refills | Status: DC

## 2022-09-17 NOTE — Telephone Encounter (Signed)
I have sent in refill for his strips and lancets to [facility].  The nature vegetable pills should be fine, but I really want him to EAT his veggies, not take a pill.

## 2022-09-17 NOTE — Telephone Encounter (Signed)
Pt called and said that his needles were one touch and came from [facility].  His test strips were contour next and came from [facility].  And he also has some balance of nature vegetable pills and wants to know if its okay to take those.

## 2022-09-17 NOTE — Progress Notes (Signed)
Endocrinology Consult Note       09/17/2022, 11:05 AM   Subjective:    Patient ID: Gabriel Schultz, male    DOB: 1950-06-03.  Gabriel Schultz is being seen in consultation for management of currently uncontrolled symptomatic diabetes requested by  Assunta Found, MD.   Past Medical History:  Diagnosis Date   Arthritis    Coronary artery disease    Diabetes mellitus without complication (HCC)    GERD (gastroesophageal reflux disease)    Gout    High cholesterol    Hypertension    Myocardial infarction [facility])    2003    Past Surgical History:  Procedure Laterality Date   BIOPSY  05/21/2019   Procedure: BIOPSY;  Surgeon: Corbin Ade, MD;  Location: [facility];  Service: Endoscopy;;   CHOLECYSTECTOMY N/A 07/31/2019   Procedure: LAPAROSCOPIC CHOLECYSTECTOMY;  Surgeon: Lucretia Roers, MD;  Location: [facility] ORS;  Service: General;  Laterality: N/A;   COLONOSCOPY N/A 11/04/2018   Sigmoid and descending colon diverticulosis, six 5-11 mm polyps in descending and ascending colon, s/p clips. Tubular adenomas. 5 year surveillance   CORONARY STENT PLACEMENT     ESOPHAGOGASTRODUODENOSCOPY (EGD) WITH PROPOFOL N/A 05/21/2019    mild incomplete Schatzki ring s/p dilation, duodenal web s/p dilation, s/p esophageal biopsy and gastric biopsy. +Barrett's, negative H.pylori. 3 year surveillance   HERNIA REPAIR Left    inguinal   MALONEY DILATION N/A 05/21/2019   Procedure: Elease Hashimoto DILATION;  Surgeon: Corbin Ade, MD;  Location: [facility];  Service: Endoscopy;  Laterality: N/A;   POLYPECTOMY  11/04/2018   Procedure: POLYPECTOMY;  Surgeon: Corbin Ade, MD;  Location: [facility];  Service: Endoscopy;;   UMBILICAL HERNIA REPAIR  07/31/2019   Procedure: HERNIA REPAIR UMBILICAL ADULT;  Surgeon: Lucretia Roers, MD;  Location: [facility];  Service: General;;    Social History   Socioeconomic History   Marital status:  Married    Spouse name: Not on file   Number of children: Not on file   Years of education: Not on file   Highest education level: Not on file  Occupational History   Not on file  Tobacco Use   Smoking status: Former    Current packs/day: 0.00    Average packs/day: 1 pack/day for 30.0 years (30.0 ttl pk-yrs)    Types: Cigarettes    Start date: 02/19/1958    Quit date: 02/20/1988    Years since quitting: 34.5   Smokeless tobacco: Former    Types: Chew    Quit date: 03/18/2016  Vaping Use   Vaping status: Never Used  Substance and Sexual Activity   Alcohol use: Not Currently    Comment: occasionally   Drug use: No   Sexual activity: Yes  Other Topics Concern   Not on file  Social History Narrative   Not on file   Social Determinants of Health   Financial Resource Strain: Not on file  Food Insecurity: Not on file  Transportation Needs: Not on file  Physical Activity: Not on file  Stress: Not on file  Social Connections: Not on file    Family History  Problem Relation Age  of Onset   Diabetes Mother    Diabetes Father     Outpatient Encounter Medications as of 09/17/2022  Medication Sig   allopurinol (ZYLOPRIM) 100 MG tablet Take 100 mg by mouth daily. In the evening   ALPRAZolam (XANAX) 0.5 MG tablet Take 0.5 mg by mouth 3 (three) times daily as needed.   amLODipine (NORVASC) 10 MG tablet Take 10 mg by mouth every evening.    aspirin EC 81 MG tablet Take 81 mg by mouth every evening.    fenofibrate 160 MG tablet Take 160 mg by mouth daily.   fluticasone (FLONASE) 50 MCG/ACT nasal spray Place 1-2 sprays into both nostrils 2 (two) times daily as needed for allergies or rhinitis.    glipiZIDE (GLUCOTROL XL) 5 MG 24 hr tablet Take 1 tablet (5 mg total) by mouth daily with breakfast.   losartan (COZAAR) 50 MG tablet Take 50 mg by mouth daily.   metoprolol succinate (TOPROL-XL) 50 MG 24 hr tablet Take 50 mg by mouth daily.   pantoprazole (PROTONIX) 40 MG tablet Take 1 tablet  (40 mg total) by mouth daily.   pravastatin (PRAVACHOL) 40 MG tablet Take 1 tablet (40 mg total) by mouth daily.   sodium bicarbonate 650 MG tablet Take 650 mg by mouth 2 (two) times daily.   sodium chloride (OCEAN) 0.65 % SOLN nasal spray Place 2 sprays into both nostrils 3 (three) times daily as needed for congestion.   [DISCONTINUED] glipiZIDE (GLUCOTROL XL) 5 MG 24 hr tablet Take 5 mg by mouth every morning.   [DISCONTINUED] FARXIGA 10 MG TABS tablet Take 10 mg by mouth daily. (Patient not taking: Reported on 09/17/2022)   No facility-administered encounter medications on file as of 09/17/2022.    ALLERGIES: No Known Allergies  VACCINATION STATUS: Immunization History  Administered Date(s) Administered   Tdap 12/04/2016    Diabetes He presents for his initial diabetic visit. He has type 2 diabetes mellitus. Onset time: diagnosed at approx age of 28. Hypoglycemia symptoms include nervousness/anxiousness, sweats and tremors. Associated symptoms include blurred vision, fatigue and polyuria. There are no hypoglycemic complications. Symptoms are stable. 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 Risk factors for coronary artery disease include diabetes mellitus, dyslipidemia, family history, male sex, obesity, hypertension and sedentary lifestyle. Current diabetic treatment includes oral agent (monotherapy). He is compliant with treatment most of the time. His weight is fluctuating minimally. He is following a generally unhealthy diet. When asked about meal planning, he reported none. He has not had a previous visit with a dietitian. He participates in exercise intermittently. His overall blood glucose range is >200 mg/dl. (He presents today for his consultation, accompanied by his spouse, with no meter or logs to review.  His most recent A1c on 6/28 was 8.4%, increasing.  He is currently only on Glipizide 10 mg XL twice daily, was taken off Farxiga by his  nephrologist.  He does not monitor glucose on any routine at home (maybe 2-3 times a week).  He drinks water, diet sun drop, cranberry juice, and grazes on food all throughout the day.  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  She does stay active with walking.  He is UTD on eye exam, has never seen podiatry in the past.) An ACE inhibitor/angiotensin II receptor blocker is being taken. He does not see a podiatrist.Eye exam is current.     Review of systems  Constitutional: + Minimally fluctuating body weight, current  Body mass index is 33.27 kg/m., no fatigue, no subjective hyperthermia, no subjective hypothermia Eyes: no blurry vision, no xerophthalmia ENT: no sore throat, no nodules palpated in throat, no dysphagia/odynophagia, no hoarseness Cardiovascular: no chest pain, no shortness of breath, no palpitations, no leg swelling Respiratory: no cough, no shortness of breath Gastrointestinal: no nausea/vomiting/diarrhea Musculoskeletal: no muscle/joint aches Skin: no rashes, no hyperemia Neurological: no tremors, no numbness, no tingling, no dizziness Psychiatric: no depression, no anxiety  Objective:     BP 138/70 (BP Location: Left Arm, Patient Position: Sitting, Cuff Size: Large)   Pulse 71   Ht 6\' 1"  (1.854 m)   Wt 252 lb 3.2 oz (114.4 kg)   BMI 33.27 kg/m   Wt Readings from Last 3 Encounters:  09/17/22 252 lb 3.2 oz (114.4 kg)  02/27/22 249 lb (112.9 kg)  10/16/21 244 lb 6.4 oz (110.9 kg)     BP Readings from Last 3 Encounters:  09/17/22 138/70  02/27/22 (!) 145/90  10/16/21 (!) 154/76     Physical Exam- Limited  Constitutional:  Body mass index is 33.27 kg/m. , not in acute distress, normal state of mind Eyes:  EOMI, no exophthalmos Neck: Supple Thyroid: No gross goiter Cardiovascular: RRR, + murmur, rubs, or gallops, no edema Respiratory: Adequate breathing efforts, no crackles, rales, rhonchi, or wheezing Musculoskeletal: no gross deformities,  strength intact in all four extremities, no gross restriction of joint movements Skin:  no rashes, no hyperemia Neurological: no tremor with outstretched hands    CMP ( most recent) CMP     Component Value Date/Time   NA 140 08/06/2019 0502   K 3.9 08/06/2019 0502   CL 105 08/06/2019 0502   CO2 21 (L) 08/06/2019 0502   GLUCOSE 179 (H) 08/06/2019 0502   BUN 30 (H) 08/06/2019 0502   CREATININE 1.98 (H) 08/06/2019 0502   CALCIUM 8.8 (L) 08/06/2019 0502   PROT 7.1 08/03/2019 0604   ALBUMIN 2.8 (L) 08/06/2019 0502   AST 26 08/03/2019 0604   ALT 39 08/03/2019 0604   ALKPHOS 51 08/03/2019 0604   BILITOT 0.7 08/03/2019 0604   GFRNONAA 33 (L) 08/06/2019 0502     Diabetic Labs (most recent): Lab Results  Component Value Date   HGBA1C 7.4 (H) 07/31/2019   HGBA1C (H) 06/05/2008    6.4 (NOTE) The ADA recommends the following therapeutic goal for glycemic control related to Hgb A1c measurement: Goal of therapy: <6.5 Hgb A1c  Reference: American Diabetes Association: Clinical Practice Recommendations 2010, Diabetes Care, 2010, 33: (Suppl  1).     Lipid Panel ( most recent) Lipid Panel  No results found for: "CHOL", "TRIG", "HDL", "CHOLHDL", "VLDL", "LDLCALC", "LDLDIRECT", "LABVLDL"    No results found for: "TSH", "FREET4"         Assessment & Plan:   1) Type 2 diabetes mellitus with stage 4 chronic kidney disease, without long-term current use of insulin (HCC)  He presents today for his consultation, accompanied by his spouse, with no meter or logs to review.  His most recent A1c on 6/28 was 8.4%, increasing.  He is currently only on Glipizide 10 mg XL twice daily, was taken off Farxiga by his nephrologist.  He does not monitor glucose on any routine at home (maybe 2-3 times a week).  He drinks water, diet sun drop, cranberry juice, and grazes on food all throughout the day.  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  She does stay active with walking.  He  is UTD on  eye exam, has never seen podiatry in the past.  - Gabriel Schultz has currently uncontrolled symptomatic type 2 DM since 72 years of age, with most recent A1c of 8.4 %.   -Recent labs reviewed.  - I had a long discussion with him about the progressive nature of diabetes and the pathology behind its complications. -his diabetes is complicated by CAD with MI, CKD stage 4, and he remains at a high risk for more acute and chronic complications which include CAD, CVA, CKD, retinopathy, and neuropathy. These are all discussed in detail with him.  The following Lifestyle Medicine recommendations according to American College of Lifestyle Medicine [facility]) were discussed and offered to patient and he agrees to start the journey:  A. Whole Foods, Plant-based plate comprising of fruits and vegetables, plant-based proteins, whole-grain carbohydrates was discussed in detail with the patient.   A list for source of those nutrients were also provided to the patient.  Patient will use only water or unsweetened tea for hydration. B.  The need to stay away from risky substances including alcohol, smoking; obtaining 7 to 9 hours of restorative sleep, at least 150 minutes of moderate intensity exercise weekly, the importance of healthy social connections,  and stress reduction techniques were discussed. C.  A full color page of  Calorie density of various food groups per pound showing examples of each food groups was provided to the patient.  - I have counseled him on diet and weight management by adopting a carbohydrate restricted/protein rich diet. Patient is encouraged to switch to unprocessed or minimally processed complex starch and increased protein intake (animal or plant source), fruits, and vegetables. -  he is advised to stick to a routine mealtimes to eat 3 meals a day and avoid unnecessary snacks (to snack only to correct hypoglycemia).   - he acknowledges that there is a room for improvement in his food and  drink choices. - Suggestion is made for him to avoid simple carbohydrates from his diet including Cakes, Sweet Desserts, Ice Cream, Soda (diet and regular), Sweet Tea, Candies, Chips, Cookies, Store Bought Juices, Alcohol in Excess of 1-2 drinks a day, Artificial Sweeteners, Coffee Creamer, and "Sugar-free" Products. This will help patient to have more stable blood glucose profile and potentially avoid unintended weight gain.  - he will be scheduled with Norm Salt, RDN, CDE for diabetes education.  - I have approached him with the following individualized plan to manage his diabetes and patient agrees:   -He is advised to lower his Glipizide to 5 mg XL daily with breakfast for safety purposes.  Given his CKD stage 4, he is at greater risk of dangerous hypoglycemia on such high doses of Glipizide.  He is advised to stay off Farxiga at this time (recently stopped by his nephrologist).  -he is encouraged to start monitoring glucose 4 times daily, before meals and before bed, to log their readings on the clinic sheets provided, and bring them to review at follow up appointment in 2 weeks.  - Adjustment parameters are given to him for hypo and hyperglycemia in writing. - he is encouraged to call clinic for blood glucose levels less than 70 or above 300 mg /dl.  - he is not a candidate for Metformin due to concurrent renal insufficiency.  - he will be considered for incretin therapy as appropriate next visit.  - Specific targets for  A1c; LDL, HDL, and Triglycerides were discussed with the patient.  2)  Blood Pressure /Hypertension:  his blood pressure is controlled to target.   he is advised to continue his current medications including Norvasc 10 mg p.o. daily with breakfast, Lostartan 50 mg po daily.  3) Lipids/Hyperlipidemia:    Review of his recent lipid panel from 08/17/22 showed uncontrolled LDL at 123 and elevated triglycerides of 384 .  he is advised to continue Fenofibrate 160 mg po  daily and Pravastatin 40 mg daily at bedtime.  Side effects and precautions discussed with him.  4)  Weight/Diet:  his Body mass index is 33.27 kg/m.  -  clearly complicating his diabetes care.   he is a candidate for weight loss. I discussed with him the fact that loss of 5 - 10% of his  current body weight will have the most impact on his diabetes management.  Exercise, and detailed carbohydrates information provided  -  detailed on discharge instructions.  5) Chronic Care/Health Maintenance: -he is on ACEI/ARB and Statin medications and is encouraged to initiate and continue to follow up with Ophthalmology, Dentist, Podiatrist at least yearly or according to recommendations, and advised to stay away from smoking. I have recommended yearly flu vaccine and pneumonia vaccine at least every 5 years; moderate intensity exercise for up to 150 minutes weekly; and sleep for at least 7 hours a day.  - he is advised to maintain close follow up with Assunta Found, MD for primary care needs, as well as his other providers for optimal and coordinated care.   - Time spent in this patient care: 60 min, of which > 50% was spent in counseling him about his diabetes and the rest reviewing his blood glucose logs, discussing his hypoglycemia and hyperglycemia episodes, reviewing his current and previous labs/studies (including abstraction from other facilities) and medications doses and developing a long term treatment plan based on the latest standards of care/guidelines; and documenting his care.    Please refer to Patient Instructions for Blood Glucose Monitoring and Insulin/Medications Dosing Guide" in media tab for additional information. Please also refer to "Patient Self Inventory" in the Media tab for reviewed elements of pertinent patient history.  Blima Singer participated in the discussions, expressed understanding, and voiced agreement with the above plans.  All questions were answered to his  satisfaction. he is encouraged to contact clinic should he have any questions or concerns prior to his return visit.     Follow up plan: - Return in about 3 months (around 12/18/2022) for Diabetes F/U with A1c in office, No previsit labs, Bring meter and logs.    Ronny Bacon, [facility] [address] [place], [place] [address] Phone: [phone] Fax: [phone]  09/17/2022, 11:05 AM

## 2022-09-17 NOTE — Telephone Encounter (Signed)
Can someone call him back and let him know this in case he has other questions.

## 2022-09-18 ENCOUNTER — Encounter: Payer: Self-pay | Admitting: Gastroenterology

## 2022-09-18 NOTE — Telephone Encounter (Signed)
Patient was called and I talked with his wife, Victorino Dike. I gave her Whitney's recommendation.

## 2022-09-20 ENCOUNTER — Other Ambulatory Visit: Payer: Self-pay | Admitting: Nurse Practitioner

## 2022-09-21 ENCOUNTER — Other Ambulatory Visit: Payer: Self-pay | Admitting: *Deleted

## 2022-09-21 DIAGNOSIS — Z7984 Long term (current) use of oral hypoglycemic drugs: Secondary | ICD-10-CM

## 2022-09-21 DIAGNOSIS — E1122 Type 2 diabetes mellitus with diabetic chronic kidney disease: Secondary | ICD-10-CM

## 2022-09-21 MED ORDER — GLUCOSE BLOOD VI STRP
ORAL_STRIP | 2 refills | Status: DC
Start: 2022-09-21 — End: 2022-09-24

## 2022-09-24 ENCOUNTER — Other Ambulatory Visit: Payer: Self-pay | Admitting: *Deleted

## 2022-09-24 DIAGNOSIS — Z7984 Long term (current) use of oral hypoglycemic drugs: Secondary | ICD-10-CM

## 2022-09-24 DIAGNOSIS — K4090 Unilateral inguinal hernia, without obstruction or gangrene, not specified as recurrent: Secondary | ICD-10-CM

## 2022-09-24 DIAGNOSIS — E1122 Type 2 diabetes mellitus with diabetic chronic kidney disease: Secondary | ICD-10-CM

## 2022-09-24 MED ORDER — ONETOUCH ULTRASOFT LANCETS MISC: 12 refills | Status: AC

## 2022-09-24 MED ORDER — ONETOUCH VERIO VI STRP
ORAL_STRIP | 3 refills | Status: AC
Start: 2022-09-24 — End: ?

## 2022-09-24 MED ORDER — ONETOUCH VERIO W/DEVICE KIT: 1.0000 | PACK | Freq: Four times a day (QID) | 0 refills | Status: DC

## 2022-09-26 ENCOUNTER — Telehealth: Payer: Self-pay | Admitting: Nurse Practitioner

## 2022-09-26 DIAGNOSIS — Z7984 Long term (current) use of oral hypoglycemic drugs: Secondary | ICD-10-CM

## 2022-09-26 DIAGNOSIS — N184 Chronic kidney disease, stage 4 (severe): Secondary | ICD-10-CM

## 2022-09-26 MED ORDER — ONETOUCH VERIO W/DEVICE KIT: 1.0000 | PACK | Freq: Four times a day (QID) | 0 refills | Status: AC

## 2022-09-26 MED ORDER — SITAGLIPTIN PHOSPHATE 50 MG PO TABS: 50.0000 mg | ORAL_TABLET | Freq: Every day | ORAL | 1 refills | Status: DC

## 2022-09-26 NOTE — Telephone Encounter (Signed)
What are their thoughts on starting a different medication to help manage those high readings?  Increasing the Glipizide is risky, especially with his kidney disease as it can contribute to dangerous low readings.  Also reinforce healthy diet- 3 well balanced meals per day, avoiding sugary beverages like juice and soda (even diet).    I was thinking of Januvia (which I did see on his past medications way back in 2021) which can help bring evening readings down some without the risk of hypoglycemia?

## 2022-09-26 NOTE — Telephone Encounter (Signed)
I will send it there.  I sent in for 90 day supply as it tends to have same copay as 30 day supply just to help conserve cost.

## 2022-09-26 NOTE — Telephone Encounter (Signed)
Pts wife states that the 1 tablet of glipizide does not seem to be working.  Says that sugars are still running very high and the lowest it has been is 172.  Wondering what they need to do next.

## 2022-09-26 NOTE — Telephone Encounter (Signed)
    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 Despite the readings she gave me earlier today. She states that the lowest has been 172 and the highest 243. She also states that when his readings are over 200 "he is not comprehending anything and just sits down and goes to sleep"

## 2022-09-26 NOTE — Telephone Encounter (Signed)
Will someone call and get 3 full days worth of readings.

## 2022-09-26 NOTE — Telephone Encounter (Signed)
Pts wife states that he has been following the diet as directed. He is willing to take Januvia. They would like this sent to [facility].

## 2022-09-26 NOTE — Telephone Encounter (Signed)
Nevermind she said [employer].

## 2022-09-26 NOTE — Telephone Encounter (Signed)
Can they provide the last 3 days or so of readings so I can come up with a plan B?

## 2022-09-26 NOTE — Telephone Encounter (Signed)
.  Called to get readings. Pts wife gave me 124/148, 174. She stated that these were for today and yesterday. But then states nothing has been under 200. I again asked her if these readings were for today and yesterday. She then stated that they are not. I explained to her that we would need exact readings before breakfast and at bedtime. She will call Mr Sarsour and call back.   Date Before breakfast Before lunch Before supper Bedtime  8/4      8/5      8/6      8/7        Pt taking: Glipizide XL 5 mg qam

## 2022-09-26 NOTE — Addendum Note (Signed)
Addended by: Jannifer Franklin A on: 09/26/2022 09:08 AM   Modules accepted: Orders

## 2022-09-26 NOTE — Telephone Encounter (Signed)
Can you send the One touch System to [facility] instead of [facility].

## 2022-09-26 NOTE — Telephone Encounter (Signed)
Rx sent 

## 2022-09-27 ENCOUNTER — Encounter: Payer: Self-pay | Admitting: General Surgery

## 2022-09-27 ENCOUNTER — Ambulatory Visit: Payer: Medicare Other | Admitting: General Surgery

## 2022-09-27 VITALS — BP 159/87 | HR 69 | Temp 97.6°F | Resp 16 | Ht 73.0 in | Wt 250.0 lb

## 2022-09-27 DIAGNOSIS — L089 Local infection of the skin and subcutaneous tissue, unspecified: Secondary | ICD-10-CM | POA: Insufficient documentation

## 2022-09-27 DIAGNOSIS — K4090 Unilateral inguinal hernia, without obstruction or gangrene, not specified as recurrent: Secondary | ICD-10-CM

## 2022-09-27 DIAGNOSIS — L720 Epidermal cyst: Secondary | ICD-10-CM | POA: Diagnosis not present

## 2022-09-27 MED ORDER — DOXYCYCLINE HYCLATE 50 MG PO CAPS: 50.0000 mg | ORAL_CAPSULE | Freq: Two times a day (BID) | ORAL | 0 refills | Status: AC

## 2022-09-27 MED ORDER — DOXYCYCLINE HYCLATE 50 MG PO CAPS: 50.0000 mg | ORAL_CAPSULE | Freq: Two times a day (BID) | ORAL | 0 refills | Status: DC

## 2022-09-27 NOTE — Patient Instructions (Addendum)
Take antibiotics if area getting more red, swollen or worried for infection. Will remove cyst on 12/06/2022 under local at the office. Can take 1000mg  of tylenol before that procedure to help with pain.  Can decide about hernia, wear your belt still, and can order CT if you decide you want to think about it longer. See if the hernia is bothering you in the next few weeks.  Get your blood sugar down.  Call with changes.

## 2022-09-27 NOTE — Progress Notes (Signed)
[facility] History and Physical  Reason for Referral: Right inguinal hernia  Referring Physician: [facility] Colin Mulders PA   Chief Complaint   Follow-up     Gabriel Schultz is a 72 y.o. male.  HPI: Gabriel Schultz is known to me after having a laparoscopic cholecystectomy for acute cholecystitis and a primary repair of a large umbilical hernia with omentum incarcerated in it in 2021. He has done well from this surgery but has noticed recently as bulge and some minor discomfort in his right groin when he has a BM. He says he can feel pressure and pulling in the area but if he is wearing his hernia belt this is better. He has felt this for a few months now. 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 He is on a diet and has adjusted his diabetes medications. He has 1-2 Bms a day now with the new dit. He had stents placed in his heart in 2003 and has not had issues since that time. He had a left inguinal hernia repaired over 20 years ago.   He had an [place] with some bowel in the right groin region consistent with an inguinal hernia.    Past Medical History:  Diagnosis Date   Arthritis    Coronary artery disease    Diabetes mellitus without complication (HCC)    GERD (gastroesophageal reflux disease)    Gout    High cholesterol    Hypertension    Myocardial infarction Gabriel Schultz)    2003    Past Surgical History:  Procedure Laterality Date   BIOPSY  05/21/2019   Procedure: BIOPSY;  Surgeon: Corbin Ade, MD;  [facility];  Service: Endoscopy;;   CHOLECYSTECTOMY N/A 07/31/2019   Procedure: LAPAROSCOPIC CHOLECYSTECTOMY;  Surgeon: Lucretia Roers, MD;  Location: [facility] ORS;  Service: General;  Laterality: N/A;   COLONOSCOPY N/A 11/04/2018   Sigmoid and descending colon diverticulosis, six 5-11 mm polyps in descending and ascending colon, s/p clips. Tubular adenomas. 5 year surveillance    CORONARY STENT PLACEMENT     ESOPHAGOGASTRODUODENOSCOPY (EGD) WITH PROPOFOL N/A 05/21/2019    mild incomplete Schatzki ring s/p dilation, duodenal web s/p dilation, s/p esophageal biopsy and gastric biopsy. +Barrett's, negative H.pylori. 3 year surveillance   HERNIA REPAIR Left    inguinal   MALONEY DILATION N/A 05/21/2019   Procedure: Elease Hashimoto DILATION;  Surgeon: Corbin Ade, MD;  Location[facility];  Service: Endoscopy;  Laterality: N/A;   POLYPECTOMY  11/04/2018   Procedure: POLYPECTOMY;  Surgeon: Corbin Ade, MD;  [facility];  Service: Endoscopy;;   UMBILICAL HERNIA REPAIR  07/31/2019   Procedure: HERNIA REPAIR UMBILICAL ADULT;  Surgeon: Lucretia Roers, MD;  Location: [facility] ORS;  Service: General;;    Family History  Problem Relation Age of Onset   Diabetes Mother    Diabetes Father     Social History   Tobacco Use   Smoking status: Former    Current packs/day: 0.00    Average packs/day: 1 pack/day for 30.0 years (30.0 ttl pk-yrs)    Types: Cigarettes    Start date: 02/19/1958    Quit date: 02/20/1988    Years since quitting: 34.6   Smokeless tobacco: Former    Types: Chew    Quit date: 03/18/2016  Vaping Use   Vaping status: Never Used  Substance Use Topics   Alcohol use: Not Currently    Comment: occasionally   Drug use: No    Medications: I have reviewed the patient's current medications. Allergies as of 09/27/2022   No Known Allergies      Medication List        Accurate as of September 27, 2022  1:57 PM. If you have any questions, ask your nurse or doctor.          allopurinol 100 MG tablet Commonly known as: ZYLOPRIM Take 100 mg by mouth daily. In the evening   ALPRAZolam 0.5 MG tablet Commonly known as: XANAX Take 0.5 mg by mouth 3 (three) times daily as needed.   amLODipine 10 MG tablet Commonly known as: NORVASC Take 10 mg by mouth every evening.   aspirin EC 81 MG tablet Take 81 mg by mouth every evening.   fenofibrate  160 MG tablet Take 160 mg by mouth daily.   fluticasone 50 MCG/ACT nasal spray Commonly known as: FLONASE Place 1-2 sprays into both nostrils 2 (two) times daily as needed for allergies or rhinitis.   glipiZIDE 5 MG 24 hr tablet Commonly known as: GLUCOTROL XL Take 1 tablet (5 mg total) by mouth daily with breakfast.   glucose blood test strip 1 each by Other route as needed for other. Use as instructed   OneTouch Verio test strip Generic drug: glucose blood Use as instructed to check blood sugar four times daily.   losartan 50 MG tablet Commonly known as: COZAAR Take 50 mg by mouth daily.   metoprolol succinate 50 MG 24 hr tablet Commonly known as: TOPROL-XL Take 50 mg by mouth daily.   onetouch ultrasoft lancets Use as instructed to check blood sugars four times daily.   OneTouch Verio Flex System w/Device Kit by Does not apply route.   OneTouch Verio w/Device Kit 1 each by Does not apply route in the morning, at noon, in the evening, and at bedtime.   pantoprazole 40 MG tablet Commonly known as: PROTONIX Take 1 tablet (40 mg total) by mouth daily.   pravastatin 40 MG tablet Commonly known as: PRAVACHOL Take 1 tablet (40 mg total) by mouth daily.   sitaGLIPtin 50 MG tablet Commonly known as: Januvia Take 1 tablet (50 mg total) by mouth daily.   sodium bicarbonate 650 MG tablet Take 650 mg by mouth 2 (two) times daily.   sodium chloride 0.65 % Soln nasal spray Commonly known as: OCEAN Place 2 sprays into both nostrils 3 (three) times daily as needed for congestion.         ROS:  A comprehensive review of systems was negative except for: Gastrointestinal: positive for right groin bulge and discomfort Endocrine: positive for diabetes   Blood pressure (!) 159/87, pulse 69, temperature 97.6 F (36.4 C), temperature source Oral, resp. rate 16, height 6\' 1"  (1.854 m), weight 250 lb (113.4 kg), SpO2 95%. Physical Exam Vitals reviewed.  HENT:     Head:  Normocephalic.     Nose: Nose normal.     Mouth/Throat:     Mouth: Mucous membranes are moist.  Eyes:     Extraocular Movements: Extraocular movements intact.  Cardiovascular:     Rate and Rhythm: Normal rate and regular rhythm.  Pulmonary:     Effort: Pulmonary effort is normal.     Breath sounds: Normal breath sounds.  Abdominal:     General: There is no distension.     Palpations: Abdomen is soft.  Tenderness: There is no abdominal tenderness.     Hernia: A hernia is present. Hernia is present in the right inguinal area. There is no hernia in the ventral area or left inguinal area.     Comments: Difficult to appreciate lying down but more obvious standing, inguinal hernia   Musculoskeletal:        General: Normal range of motion.     Cervical back: Normal range of motion.     Comments: Left lower back 4cm cyst with swollen, no obvious drainage or redness, soft, superficial   Skin:    General: Skin is warm.  Neurological:     General: No focal deficit present.     Mental Status: He is alert and oriented to person, place, and time.  Psychiatric:        Mood and Affect: Mood normal.        Thought Content: Thought content normal.     Results: Personally reviewed- bowel in right inguina region  CLINICAL DATA:  Acute right groin pain.   EXAM: LIMITED ULTRASOUND OF PELVIS   TECHNIQUE: Limited transabdominal ultrasound examination of the pelvis was performed.   COMPARISON:  CT scan of September 29, 2015.   FINDINGS: Limited sonographic evaluation was performed of the right inguinal canal. Multiple lymph nodes are noted with the largest measuring 6 mm in minor axis and most likely reactive in etiology. Probable moderate size right inguinal hernia is noted which probably contains bowel loops.   IMPRESSION: Probable moderate size right inguinal hernia which probably contains bowel loops. CT scan may be performed for further evaluation.     Electronically Signed    By: Lupita Raider M.D.   On: 09/06/2022 10:38  Assessment & Plan:  SIRAJ HUTCHINGS is a 72 y.o. male with a right inguinal hernia with minimal discomfort that is mostly with BM and not all the time.  Discussed the risk and benefits including, bleeding, infection, use of mesh, risk of recurrence, risk of nerve damage causing numbness or changes in sensation, risk of damage to the cord structures. The patient understands the risk and benefits of repair with mesh, and has decided to proceed.  We also discussed open versus robotic assisted laparoscopic surgery and the use of mesh. We discussed that I do both robotic and open repairs with mesh, and that these are considered equivalent. We discussed reasons for opting for laparoscopic surgery including if a bilateral repair is needed or if a patient has a recurrence after an open repair. We discussed the option of watch and wait in men and discussed that in 5 years some studies report that 40% of men have crossed over to needing a hernia repair because the hernia has become larger or symptomatic. We discussed that women are not appropriate candidate for watchful waiting due to the risk of femoral hernias.    He wants to watch this for a while and also see if he can lose more weight and get his diabetes better controlled.   He does have a back cyst that is swollen and has been hurting him. Doxycyline sent in for him if this gets infected Plan to resect it in the office under local. Discussed risk of bleeding, infection, recurrence. Will see how he is doing then with the hernia too.   Take antibiotics if area getting more red, swollen or worried for infection. Will remove cyst on 12/06/2022 under local at the office. Can take 1000mg  of tylenol before that procedure to  help with pain.  Can decide about hernia, wear your belt still, and can order CT if you decide you want to think about it longer. See if the hernia is bothering you in the next few weeks.   Get your blood sugar down.  Call with changes.   Future Appointments  Date Time Provider Department Center  12/06/2022  3:00 PM Lucretia Roers, MD [facility] None  12/20/2022  8:30 AM Dani Gobble, NP [facility] None    All questions were answered to the satisfaction of the patient and family.    Lucretia Roers 09/27/2022, 1:57 PM

## 2022-10-01 ENCOUNTER — Telehealth: Payer: Self-pay | Admitting: Nurse Practitioner

## 2022-10-01 NOTE — Telephone Encounter (Signed)
Patient's wife was called and made aware. 

## 2022-10-01 NOTE — Telephone Encounter (Signed)
Pt called with high BG readings.   Date Before breakfast Before lunch Before supper Bedtime  09/28/22 217   151  09/29/22 190   162  09/30/22 190   225  10/01/22 214       Pt taking: After pt been taking [place] since Tuesday.

## 2022-10-01 NOTE — Telephone Encounter (Signed)
It is going to take a bit more time for the Januvia to get in his system fully, have him continue with the current plan and update me in another week or so with readings.

## 2022-10-01 NOTE — Telephone Encounter (Signed)
Tammy can you call and relay this to them in case his wife has other questions.

## 2022-10-16 DIAGNOSIS — H5203 Hypermetropia, bilateral: Secondary | ICD-10-CM | POA: Diagnosis not present

## 2022-10-16 DIAGNOSIS — H40013 Open angle with borderline findings, low risk, bilateral: Secondary | ICD-10-CM | POA: Diagnosis not present

## 2022-10-16 DIAGNOSIS — E119 Type 2 diabetes mellitus without complications: Secondary | ICD-10-CM | POA: Diagnosis not present

## 2022-10-16 DIAGNOSIS — H52203 Unspecified astigmatism, bilateral: Secondary | ICD-10-CM | POA: Diagnosis not present

## 2022-10-16 DIAGNOSIS — H31002 Unspecified chorioretinal scars, left eye: Secondary | ICD-10-CM | POA: Diagnosis not present

## 2022-10-20 DIAGNOSIS — I10 Essential (primary) hypertension: Secondary | ICD-10-CM | POA: Diagnosis not present

## 2022-10-20 DIAGNOSIS — E1159 Type 2 diabetes mellitus with other circulatory complications: Secondary | ICD-10-CM | POA: Diagnosis not present

## 2022-10-22 DIAGNOSIS — R030 Elevated blood-pressure reading, without diagnosis of hypertension: Secondary | ICD-10-CM | POA: Diagnosis not present

## 2022-10-22 DIAGNOSIS — K649 Unspecified hemorrhoids: Secondary | ICD-10-CM | POA: Diagnosis not present

## 2022-10-24 ENCOUNTER — Encounter: Payer: Self-pay | Admitting: General Surgery

## 2022-10-24 ENCOUNTER — Ambulatory Visit (INDEPENDENT_AMBULATORY_CARE_PROVIDER_SITE_OTHER): Payer: Medicare Other | Admitting: General Surgery

## 2022-10-24 VITALS — BP 129/77 | HR 58 | Temp 97.5°F | Resp 14 | Ht 73.0 in | Wt 244.0 lb

## 2022-10-24 DIAGNOSIS — L089 Local infection of the skin and subcutaneous tissue, unspecified: Secondary | ICD-10-CM

## 2022-10-24 DIAGNOSIS — L720 Epidermal cyst: Secondary | ICD-10-CM

## 2022-10-24 MED ORDER — OXYCODONE HCL 5 MG PO TABS: 5.0000 mg | ORAL_TABLET | ORAL | 0 refills | Status: AC | PRN

## 2022-10-24 MED ORDER — DOXYCYCLINE HYCLATE 50 MG PO CAPS: 50.0000 mg | ORAL_CAPSULE | Freq: Two times a day (BID) | ORAL | 0 refills | Status: AC

## 2022-10-24 NOTE — Patient Instructions (Addendum)
Pack wound daily with saline dampened gauze, cover with gauze and paper tape. Can shower and leave dressing in place and then after the shower replace with new dressing.  As time progresses you will put less and less packing into the wound.   Tylenol and ibuprofen for pain control. Take roxicodone if needed for severe pain. Do not use narcotics and operate heavy equipment.  Take antibiotic.  Call with issues.

## 2022-10-24 NOTE — Progress Notes (Signed)
[facility] Procedure Note  10/24/22  Pre-procedure Diagnosis: Infected sebaceous cyst    Post-procedure Diagnosis: Same   Procedure(s) Performed: Incision and drainage of cyst    Surgeon: Leatrice Jewels. Henreitta Leber, MD   Assistants: No qualified resident was available    Anesthesia: Lidocaine 1%    Specimens:  Culture    Estimated Blood Loss: Minimal  Wound Class: Dirty infected   Procedure Indications: Gabriel Schultz is a 72 yo who was coming in to have a cyst excised but the area has become swollen and inflamed. The area is now acting infected and we discussed incision and drainage with packing. Discussed risk of bleeding, need for packing, recurrence.   Findings: Purulent drainage from cyst, necrotic cyst wall   Procedure: The patient was taken to the procedure room and placed  on his right side down. The back was prepared and draped in the usual sterile fashion. Lidocaine 1% was injected around the cyst.   An incision was made and carried down opening up purulent drainage which was cultured. This was flushed with saline and the wall of the remaining cyst appeared necrotic. I cleared out the cavity with saline and irritated the lining with a dry gauze to ensure that cyst wall lining was removed. There was no obvious remaining cyst wall as it appeared necrotic on the posterior aspect.   The wound was packed with saline dampened gauze and covered with dry gauze and paper tape.   Final inspection revealed acceptable hemostasis. The patient tolerated the procedure well.   Future Appointments  Date Time Provider Department Center  11/08/2022  9:45 AM Lucretia Roers, MD [facility] None  11/19/2022 11:00 AM Sharlene Dory, NP [facility]  12/20/2022  8:30 AM Dani Gobble, NP RE[facility] None    Pack wound daily with saline dampened gauze, cover with gauze and paper tape. Can shower and leave dressing in place and then after the shower replace with new dressing.   As time progresses you will put less and less packing into the wound.   Tylenol and ibuprofen for pain control. Take roxicodone if needed for severe pain. Do not use narcotics and operate heavy equipment.  Take antibiotic.  Call with issues.   [name], MD [facility] [address] [place], [place] [address] [phone] (office)

## 2022-10-24 NOTE — Addendum Note (Signed)
Addended by: Legrand Rams B on: 10/24/2022 01:23 PM   Modules accepted: Orders

## 2022-10-26 ENCOUNTER — Ambulatory Visit: Payer: Self-pay | Admitting: Nurse Practitioner

## 2022-10-27 LAB — WOUND CULTURE
MICRO NUMBER:: 15420631
SPECIMEN QUALITY:: ADEQUATE

## 2022-10-29 DIAGNOSIS — N184 Chronic kidney disease, stage 4 (severe): Secondary | ICD-10-CM | POA: Diagnosis not present

## 2022-10-29 DIAGNOSIS — I129 Hypertensive chronic kidney disease with stage 1 through stage 4 chronic kidney disease, or unspecified chronic kidney disease: Secondary | ICD-10-CM | POA: Diagnosis not present

## 2022-10-29 DIAGNOSIS — N2581 Secondary hyperparathyroidism of renal origin: Secondary | ICD-10-CM | POA: Diagnosis not present

## 2022-10-29 DIAGNOSIS — D631 Anemia in chronic kidney disease: Secondary | ICD-10-CM | POA: Diagnosis not present

## 2022-10-29 DIAGNOSIS — N189 Chronic kidney disease, unspecified: Secondary | ICD-10-CM | POA: Diagnosis not present

## 2022-10-29 DIAGNOSIS — E872 Acidosis, unspecified: Secondary | ICD-10-CM | POA: Diagnosis not present

## 2022-10-29 DIAGNOSIS — E1122 Type 2 diabetes mellitus with diabetic chronic kidney disease: Secondary | ICD-10-CM | POA: Diagnosis not present

## 2022-10-29 DIAGNOSIS — N1832 Chronic kidney disease, stage 3b: Secondary | ICD-10-CM | POA: Diagnosis not present

## 2022-11-08 ENCOUNTER — Ambulatory Visit (INDEPENDENT_AMBULATORY_CARE_PROVIDER_SITE_OTHER): Payer: Medicare Other | Admitting: General Surgery

## 2022-11-08 ENCOUNTER — Encounter: Payer: Self-pay | Admitting: General Surgery

## 2022-11-08 VITALS — BP 125/65 | HR 56 | Temp 97.7°F | Resp 14 | Ht 73.0 in | Wt 244.0 lb

## 2022-11-08 DIAGNOSIS — L720 Epidermal cyst: Secondary | ICD-10-CM

## 2022-11-08 DIAGNOSIS — L089 Local infection of the skin and subcutaneous tissue, unspecified: Secondary | ICD-10-CM

## 2022-11-08 NOTE — Patient Instructions (Signed)
Continue the packing until area is superficial (more flush with skin) then can do neosporin and bandaid. Can try bandaid now instead of the gauze/ tape to cover up packing. If you are running out of iodoform let us know and we can get you another bottle but likely with using less and less you will be ok.

## 2022-11-08 NOTE — Progress Notes (Signed)
[facility]  Doing well. Packing going well.  BP 125/65   Pulse (!) 56   Temp 97.7 F (36.5 C) (Oral)   Resp 14   Ht 6\' 1"  (1.854 m)   Wt 244 lb (110.7 kg)   SpO2 94%   BMI 32.19 kg/m  Well healed, granulated, cavity about the size of a kidney bean, repacked  Patient s/p I&D of infected cyst. Doing well.   Continue the packing until area is superficial (more flush with skin) then can do neosporin and bandaid. Can try bandaid now instead of the gauze/ tape to cover up packing. If you are running out of iodoform let us know and we can get you another bottle but likely with using less and less you will be ok.   Future Appointments  Date Time Provider Department Center  11/19/2022 11:00 AM Sharlene Dory, NP [facility]  11/27/2022  9:15 AM Lucretia Roers, MD [facility] None  12/20/2022  8:30 AM Dani Gobble, NP [facility] None   [name], MD [facility] [address] [place] [place], [place] [address] [phone] (office)

## 2022-11-13 DIAGNOSIS — I10 Essential (primary) hypertension: Secondary | ICD-10-CM | POA: Diagnosis not present

## 2022-11-13 DIAGNOSIS — Z23 Encounter for immunization: Secondary | ICD-10-CM | POA: Diagnosis not present

## 2022-11-13 DIAGNOSIS — E1129 Type 2 diabetes mellitus with other diabetic kidney complication: Secondary | ICD-10-CM | POA: Diagnosis not present

## 2022-11-13 DIAGNOSIS — N184 Chronic kidney disease, stage 4 (severe): Secondary | ICD-10-CM | POA: Diagnosis not present

## 2022-11-13 DIAGNOSIS — I2510 Atherosclerotic heart disease of native coronary artery without angina pectoris: Secondary | ICD-10-CM | POA: Diagnosis not present

## 2022-11-13 DIAGNOSIS — K219 Gastro-esophageal reflux disease without esophagitis: Secondary | ICD-10-CM | POA: Diagnosis not present

## 2022-11-19 ENCOUNTER — Encounter: Payer: Self-pay | Admitting: Nurse Practitioner

## 2022-11-19 ENCOUNTER — Ambulatory Visit: Payer: Medicare Other | Attending: Nurse Practitioner | Admitting: Nurse Practitioner

## 2022-11-19 VITALS — BP 112/70 | HR 62 | Ht 73.0 in | Wt 244.2 lb

## 2022-11-19 DIAGNOSIS — R0609 Other forms of dyspnea: Secondary | ICD-10-CM

## 2022-11-19 DIAGNOSIS — E782 Mixed hyperlipidemia: Secondary | ICD-10-CM

## 2022-11-19 DIAGNOSIS — M791 Myalgia, unspecified site: Secondary | ICD-10-CM | POA: Diagnosis not present

## 2022-11-19 DIAGNOSIS — N184 Chronic kidney disease, stage 4 (severe): Secondary | ICD-10-CM

## 2022-11-19 DIAGNOSIS — T466X5A Adverse effect of antihyperlipidemic and antiarteriosclerotic drugs, initial encounter: Secondary | ICD-10-CM | POA: Diagnosis not present

## 2022-11-19 DIAGNOSIS — I2510 Atherosclerotic heart disease of native coronary artery without angina pectoris: Secondary | ICD-10-CM | POA: Diagnosis not present

## 2022-11-19 DIAGNOSIS — Z789 Other specified health status: Secondary | ICD-10-CM

## 2022-11-19 DIAGNOSIS — E785 Hyperlipidemia, unspecified: Secondary | ICD-10-CM

## 2022-11-19 DIAGNOSIS — I10 Essential (primary) hypertension: Secondary | ICD-10-CM

## 2022-11-19 NOTE — Progress Notes (Unsigned)
Cardiology Office Note:  .   Date:  11/19/2022 ID:  Gabriel Schultz, DOB May 27, 1950, MRN [id number] PCP: Assunta Found, MD  [facility] Providers Cardiologist:  Dina Rich, MD    History of Present Illness: .   Gabriel Schultz is a 72 y.o. male with a PMH of CAD, hypertension, hyperlipidemia, leg pains, type 2 diabetes, history of gout, GERD, and CKD stage IV (sees Nephrology), who presents today for 3-month follow-up appointment.  Previous CV history of stenting to the RCA and left circumflex in 2010.  Last seen by Dr. Dina Rich on February 27, 2022.  Blood pressure was above goal at office visit.  Dr. Wyline Mood recommended if blood pressures continue to remain above goal at home would likely add hydralazine given history of renal dysfunction.  Pravastatin increased to 40 mg daily.  Today he presents for 22-month follow-up appointment.  He remains very active and admits to intermittent sensation of breathing harder when working, admits to myalgias, used to be able to walk 2 miles and now not able to do that. Denies any chest pain, palpitations, syncope, presyncope, dizziness, orthopnea, PND, swelling or significant weight changes, acute bleeding, or claudication.  ROS: Negative. See HPI.   Studies Reviewed: [name]    EKG:  EKG Interpretation Date/Time:  Monday November 19 2022 10:58:14 EDT Ventricular Rate:  61 PR Interval:  182 QRS Duration:  88 QT Interval:  402 QTC Calculation: 404 R Axis:   51  Text Interpretation: Normal sinus rhythm Nonspecific T wave abnormality When compared with ECG of 30-Jul-2019 15:27, No significant change was found Confirmed by Sharlene Dory [phone]) on 11/19/2022 11:01:37 AM   Vascular ultrasound lower extremity arterial duplex 09/2021: Summary:  Right: Resting right ankle-brachial index is within normal range. No  evidence of significant right lower extremity arterial disease. The right  toe-brachial index is normal.   Left: Resting left  ankle-brachial index is within normal range. No  evidence of significant left lower extremity arterial disease. The left  toe-brachial index is normal.  Echo 05/2020: 1. Left ventricular ejection fraction, by estimation, is 60 to 65%. The  left ventricle has normal function. The left ventricle has no regional  wall motion abnormalities. There is mild left ventricular hypertrophy.  Left ventricular diastolic parameters  are consistent with Grade I diastolic dysfunction (impaired relaxation).  The average left ventricular global longitudinal strain is 17.7 %. The  global longitudinal strain is normal.   2. Right ventricular systolic function is normal. The right ventricular  size is normal.   3. The mitral valve is normal in structure. No evidence of mitral valve  regurgitation. No evidence of mitral stenosis.   4. The aortic valve is tricuspid. Aortic valve regurgitation is not  visualized. No aortic stenosis is present.   Comparison(s): Echocardiogram done 04/25/16 showed an EF of 65-70%.  Lexiscan 2012: Normal study, low risk scan.  Physical Exam:   VS:  BP 112/70   Pulse 62   Ht 6\' 1"  (1.854 m)   Wt 244 lb 3.2 oz (110.8 kg)   SpO2 97%   BMI 32.22 kg/m    Wt Readings from Last 3 Encounters:  11/19/22 244 lb 3.2 oz (110.8 kg)  11/08/22 244 lb (110.7 kg)  10/24/22 244 lb (110.7 kg)    GEN: Obese, 72 y.o. male in no acute distress NECK: No JVD; No carotid bruits CARDIAC: S1/S2, RRR, no murmurs, rubs, gallops RESPIRATORY:  Clear to auscultation without rales, wheezing or rhonchi  EXTREMITIES:  No edema; No deformity   ASSESSMENT AND PLAN: .   CAD, DOE Has noted intermittent for past 2-3 months sensation of breathing harder when working. Denies any chest pain. Etiology multifactorial. Will update Echocardiogram at this time to evaluate for any wall motion abnormalities and EF to explain DOE. If test comes back benign, may need to consider ischemic evaluation. Continue aspirin,  fenofibrate, losartan, Toprol XL, stopping Pravastatin - see below. Heart healthy diet encouraged. Care and ED precautions discussed.   HTN BP stable. No medication changes at this time. Discussed to monitor BP at home at least 2 hours after medications and sitting for 5-10 minutes. Heart healthy diet encouraged.   HLD, myalgia d/t statin, statin intolerance Admits to myalgias. Hx of statin intolerance. Will stop pravastatin. Will refer to [facility]id Clinic to discuss PCSK9i/Leqvio. Heart healthy diet encouraged.   CKD stage IV Most recent labs stable. Avoid nephrotoxic agents and encouraged adequate hydration. Follow-up with Nephrology and PCP as scheduled.   Dispo: Follow-up with me/[facility] in 6 weeks or sooner if anything changes.   Signed, Sharlene Dory, NP

## 2022-11-19 NOTE — Patient Instructions (Addendum)
Medication Instructions:  Your physician has recommended you make the following change in your medication:  Stop Pravastatin Continue all other medications as prescribed   Labwork: None  Testing/Procedures: Your physician has requested that you have an echocardiogram. Echocardiography is a painless test that uses sound waves to create images of your heart. It provides your doctor with information about the size and shape of your heart and how well your heart's chambers and valves are working. This procedure takes approximately one hour. There are no restrictions for this procedure. Please do NOT wear cologne, perfume, aftershave, or lotions (deodorant is allowed). Please arrive 15 minutes prior to your appointment time.  Follow-Up: Your physician recommends that you schedule a follow-up appointment in: 6 weeks   Any Other Special Instructions Will Be Listed Below (If Applicable).  If you need a refill on your cardiac medications before your next appointment, please call your pharmacy.

## 2022-11-27 ENCOUNTER — Encounter: Payer: Self-pay | Admitting: General Surgery

## 2022-11-27 ENCOUNTER — Ambulatory Visit (INDEPENDENT_AMBULATORY_CARE_PROVIDER_SITE_OTHER): Payer: Medicare Other | Admitting: General Surgery

## 2022-11-27 VITALS — BP 144/74 | HR 60 | Temp 97.6°F | Resp 14 | Ht 73.0 in | Wt 246.0 lb

## 2022-11-27 DIAGNOSIS — L720 Epidermal cyst: Secondary | ICD-10-CM

## 2022-11-27 DIAGNOSIS — L089 Local infection of the skin and subcutaneous tissue, unspecified: Secondary | ICD-10-CM

## 2022-11-27 NOTE — Progress Notes (Signed)
[facility]  Doing well. Area almost healed.  No issues. Discussed that could recur but unlikely with the I&D and the packing.   BP (!) 144/74   Pulse 60   Temp 97.6 F (36.4 C) (Oral)   Resp 14   Ht 6\' 1"  (1.854 m)   Wt 246 lb (111.6 kg)   SpO2 96%   BMI 32.46 kg/m  Small slither of un-epithelized tissue.  Call with issues.  Bandaid replaced. Continue bandaid until skin over area, ~ 1week.   [name], MD [facility] [address] [place] [place], [place] [address] [phone] (office)

## 2022-11-29 ENCOUNTER — Ambulatory Visit: Payer: Medicare Other | Attending: Nurse Practitioner

## 2022-11-29 DIAGNOSIS — R0609 Other forms of dyspnea: Secondary | ICD-10-CM | POA: Diagnosis not present

## 2022-12-02 LAB — ECHOCARDIOGRAM COMPLETE
AR max vel: 2.22 cm2
AV Area VTI: 1.95 cm2
AV Area mean vel: 2.19 cm2
AV Mean grad: 9 mm[Hg]
AV Peak grad: 16.6 mm[Hg]
Ao pk vel: 2.04 m/s
Area-P 1/2: 2.91 cm2
Calc EF: 60.6 %
MV VTI: 2.72 cm2
S' Lateral: 3.6 cm
Single Plane A2C EF: 66.2 %
Single Plane A4C EF: 56.1 %

## 2022-12-06 ENCOUNTER — Ambulatory Visit: Payer: Medicare Other | Admitting: General Surgery

## 2022-12-13 ENCOUNTER — Telehealth: Payer: Self-pay | Admitting: Pharmacy Technician

## 2022-12-13 ENCOUNTER — Encounter: Payer: Self-pay | Admitting: Pharmacist

## 2022-12-13 ENCOUNTER — Ambulatory Visit: Payer: Medicare Other | Attending: Cardiology | Admitting: Pharmacist

## 2022-12-13 ENCOUNTER — Other Ambulatory Visit (HOSPITAL_COMMUNITY): Payer: Self-pay

## 2022-12-13 ENCOUNTER — Telehealth: Payer: Self-pay | Admitting: Pharmacist

## 2022-12-13 DIAGNOSIS — E782 Mixed hyperlipidemia: Secondary | ICD-10-CM | POA: Diagnosis not present

## 2022-12-13 DIAGNOSIS — G720 Drug-induced myopathy: Secondary | ICD-10-CM | POA: Diagnosis not present

## 2022-12-13 DIAGNOSIS — I2510 Atherosclerotic heart disease of native coronary artery without angina pectoris: Secondary | ICD-10-CM

## 2022-12-13 DIAGNOSIS — I252 Old myocardial infarction: Secondary | ICD-10-CM | POA: Diagnosis not present

## 2022-12-13 DIAGNOSIS — E119 Type 2 diabetes mellitus without complications: Secondary | ICD-10-CM | POA: Insufficient documentation

## 2022-12-13 DIAGNOSIS — T466X5D Adverse effect of antihyperlipidemic and antiarteriosclerotic drugs, subsequent encounter: Secondary | ICD-10-CM

## 2022-12-13 DIAGNOSIS — E1122 Type 2 diabetes mellitus with diabetic chronic kidney disease: Secondary | ICD-10-CM

## 2022-12-13 NOTE — Telephone Encounter (Signed)
Pharmacy Patient Advocate Encounter  Received notification from [facility] that Prior Authorization for repatha has been APPROVED from 12/13/22 to 05/24/23. Ran test claim, Copay is $165.27- one month (GAP). This test claim was processed through [facility]- copay amounts may vary at other pharmacies due to pharmacy/plan contracts, or as the patient moves through the different stages of their insurance plan.   PA #/Case ID/Reference #: [id number]

## 2022-12-13 NOTE — Patient Instructions (Signed)
It was nice meeting you today  We would like to start a new medication called Repatha which is an injection you would take once every 2 weeks  I will complete the prior authorization for you and contact you when it is approved  Once you start the medication we will recheck your fasting lipid panel in about 3 months  Please let us know if there are any questions  Laural Golden, PharmD, BCACP, CDCES, CPP [address] [place], [place], [address] Phone: [phone], Fax: [phone]

## 2022-12-13 NOTE — Telephone Encounter (Signed)
Pharmacy Patient Advocate Encounter   Received notification from Pt Calls Messages that prior authorization for repatha is required/requested.   Insurance verification completed.   The patient is insured through [facility] .   Per test claim: PA required; PA submitted to [facility] via CoverMyMeds Key/confirmation #/EOC [id number] Status is pending

## 2022-12-13 NOTE — Progress Notes (Signed)
Patient ID: Gabriel Schultz                 DOB: 05-01-1950                    MRN: [id number]     HPI: Gabriel Schultz is a 72 y.o. male patient referred to lipid clinic by Gabriel Schultz. Patient of Dr Gabriel Schultz. PMH is significant for CAD, T2DM, CKD, history of MI, and statin intolerance.   Patient presents today with wife. Has tried both pravastatin and atorvastatin and both caused myalgias. Discontinued pravastatin last month and feels much better. Remains on fenofibrate without adverse effects.  Patient says he is physically active and still working and reports his diet is well controlled. His wife does not believe he is as active as he says.  Had stents placed in 2010.  Current Medications:  Fenofibrate 160mg  daily  Intolerances:  Pravastatin   Risk Factors:  CAD T2DM  LDL goal: <55  Labs: TC 226, Trigs 384, HDL 35, LDL 123 (08/16/22)  Past Medical History:  Diagnosis Date   Arthritis    Coronary artery disease    Diabetes mellitus without complication (HCC)    GERD (gastroesophageal reflux disease)    Gout    High cholesterol    Hypertension    Myocardial infarction [facility])    2003    Current Outpatient Medications on File Prior to Visit  Medication Sig Dispense Refill   allopurinol (ZYLOPRIM) 100 MG tablet Take 100 mg by mouth daily. In the evening     ALPRAZolam (XANAX) 0.5 MG tablet Take 0.5 mg by mouth 3 (three) times daily as needed.     amLODipine (NORVASC) 10 MG tablet Take 10 mg by mouth every evening.      aspirin EC 81 MG tablet Take 81 mg by mouth every evening.      Blood Glucose Monitoring Suppl (ONETOUCH VERIO FLEX SYSTEM) w/Device KIT by Does not apply route.     Blood Glucose Monitoring Suppl (ONETOUCH VERIO) w/Device KIT 1 each by Does not apply route in the morning, at noon, in the evening, and at bedtime. 1 kit 0   fenofibrate 160 MG tablet Take 160 mg by mouth daily.     fluticasone (FLONASE) 50 MCG/ACT nasal spray Place 1-2 sprays into both nostrils  2 (two) times daily as needed for allergies or rhinitis.      glipiZIDE (GLUCOTROL XL) 5 MG 24 hr tablet Take 1 tablet (5 mg total) by mouth daily with breakfast. 90 tablet 3   glucose blood (ONETOUCH VERIO) test strip Use as instructed to check blood sugar four times daily. 300 each 3   glucose blood test strip 1 each by Other route as needed for other. Use as instructed     Lancets (ONETOUCH ULTRASOFT) lancets Use as instructed to check blood sugars four times daily. 100 each 12   losartan (COZAAR) 50 MG tablet Take 50 mg by mouth daily.     metoprolol succinate (TOPROL-XL) 50 MG 24 hr tablet Take 50 mg by mouth daily.     oxyCODONE (ROXICODONE) 5 MG immediate release tablet Take 1 tablet (5 mg total) by mouth every 4 (four) hours as needed for severe pain or breakthrough pain. 4 tablet 0   pantoprazole (PROTONIX) 40 MG tablet Take 1 tablet (40 mg total) by mouth daily. 30 tablet 11   sitaGLIPtin (JANUVIA) 50 MG tablet Take 1 tablet (50 mg total) by mouth daily. 90  tablet 1   sodium bicarbonate 650 MG tablet Take 650 mg by mouth 2 (two) times daily.     sodium chloride (OCEAN) 0.65 % SOLN nasal spray Place 2 sprays into both nostrils 3 (three) times daily as needed for congestion.     No current facility-administered medications on file prior to visit.    Allergies  Allergen Reactions   Pravastatin Other (See Comments)    Myalgia    Assessment/Plan:  1. Hyperlipidemia - Patient last LDL 123 is above goal of <55. Aggressive goal due to history of MI and T2DM. Intolerant to statins. Advised next steps would be PCSK9i or inclisiran. Due to coverage, recommend PCSK9i.  Using demo pen, educated patient on mechanism of action, storage, site selection, administration, and possible adverse effects. Patient was able to demonstrate in room using demo pen. Will complete PA and contact patient when approved. Recheck lipid panel in 3 months.   Per wife's request, changed password for patient's  myChart.  Start Repatha 140mg  q 2 weeks Recheck lipid panel in 3 months  Laural Golden, PharmD, BCACP, CDCES, CPP [address], [address] [place], [place], [address] Phone: [phone], Fax: [phone]

## 2022-12-13 NOTE — Telephone Encounter (Signed)
Please complete PA for Repatha 

## 2022-12-14 MED ORDER — REPATHA SURECLICK 140 MG/ML ~~LOC~~ SOAJ
1.0000 mL | SUBCUTANEOUS | 5 refills | Status: DC
Start: 2022-12-14 — End: 2023-11-26

## 2022-12-14 NOTE — Telephone Encounter (Signed)
Spoke with patient's wife and let her know Repatha was approved and price. Husband and wife will discuss if they want to start now or wait until January when benefits reset

## 2022-12-14 NOTE — Addendum Note (Signed)
Addended by: Cheree Ditto on: 12/14/2022 02:59 PM   Modules accepted: Orders

## 2022-12-19 DIAGNOSIS — N184 Chronic kidney disease, stage 4 (severe): Secondary | ICD-10-CM | POA: Diagnosis not present

## 2022-12-20 ENCOUNTER — Ambulatory Visit: Payer: Medicare Other | Admitting: Nurse Practitioner

## 2022-12-20 ENCOUNTER — Telehealth: Payer: Self-pay | Admitting: Nurse Practitioner

## 2022-12-20 DIAGNOSIS — K219 Gastro-esophageal reflux disease without esophagitis: Secondary | ICD-10-CM | POA: Diagnosis not present

## 2022-12-20 DIAGNOSIS — Z7984 Long term (current) use of oral hypoglycemic drugs: Secondary | ICD-10-CM

## 2022-12-20 DIAGNOSIS — I2510 Atherosclerotic heart disease of native coronary artery without angina pectoris: Secondary | ICD-10-CM | POA: Diagnosis not present

## 2022-12-20 DIAGNOSIS — E1122 Type 2 diabetes mellitus with diabetic chronic kidney disease: Secondary | ICD-10-CM

## 2022-12-20 DIAGNOSIS — E1129 Type 2 diabetes mellitus with other diabetic kidney complication: Secondary | ICD-10-CM | POA: Diagnosis not present

## 2022-12-20 NOTE — Telephone Encounter (Signed)
Patient no showed appt.  Will make attempts to reschedule

## 2022-12-24 DIAGNOSIS — H52203 Unspecified astigmatism, bilateral: Secondary | ICD-10-CM | POA: Diagnosis not present

## 2022-12-24 DIAGNOSIS — H02403 Unspecified ptosis of bilateral eyelids: Secondary | ICD-10-CM | POA: Diagnosis not present

## 2022-12-24 DIAGNOSIS — H2513 Age-related nuclear cataract, bilateral: Secondary | ICD-10-CM | POA: Diagnosis not present

## 2022-12-24 DIAGNOSIS — E119 Type 2 diabetes mellitus without complications: Secondary | ICD-10-CM | POA: Diagnosis not present

## 2022-12-31 ENCOUNTER — Telehealth: Payer: Self-pay | Admitting: Nurse Practitioner

## 2022-12-31 ENCOUNTER — Encounter: Payer: Self-pay | Admitting: Nurse Practitioner

## 2022-12-31 ENCOUNTER — Ambulatory Visit: Payer: Medicare Other | Attending: Nurse Practitioner | Admitting: Nurse Practitioner

## 2022-12-31 VITALS — BP 128/80 | HR 80 | Ht 73.0 in | Wt 247.4 lb

## 2022-12-31 DIAGNOSIS — I10 Essential (primary) hypertension: Secondary | ICD-10-CM

## 2022-12-31 DIAGNOSIS — E785 Hyperlipidemia, unspecified: Secondary | ICD-10-CM

## 2022-12-31 DIAGNOSIS — R110 Nausea: Secondary | ICD-10-CM

## 2022-12-31 DIAGNOSIS — N184 Chronic kidney disease, stage 4 (severe): Secondary | ICD-10-CM

## 2022-12-31 DIAGNOSIS — Z789 Other specified health status: Secondary | ICD-10-CM | POA: Diagnosis not present

## 2022-12-31 DIAGNOSIS — K068 Other specified disorders of gingiva and edentulous alveolar ridge: Secondary | ICD-10-CM

## 2022-12-31 DIAGNOSIS — R0609 Other forms of dyspnea: Secondary | ICD-10-CM

## 2022-12-31 DIAGNOSIS — I2510 Atherosclerotic heart disease of native coronary artery without angina pectoris: Secondary | ICD-10-CM

## 2022-12-31 NOTE — Progress Notes (Unsigned)
Cardiology Office Note:  .   Date:  11/19/2022 ID:  Gabriel Schultz, DOB [birth year], MRN [id number] PCP: Assunta Found, MD  [facility] Providers Cardiologist:  Dina Rich, MD    History of Present Illness: .   Gabriel Schultz is a 72 y.o. male with a PMH of CAD, hypertension, hyperlipidemia, leg pains, type 2 diabetes, history of gout, GERD, and CKD stage IV (sees Nephrology), who presents today for 23-month follow-up appointment.  Previous CV history of stenting to the RCA and left circumflex in 2010.  Last seen by Dr. Dina Rich on February 27, 2022.  Blood pressure was above goal at office visit.  Dr. Wyline Mood recommended if blood pressures continue to remain above goal at home would likely add hydralazine given history of renal dysfunction.  Pravastatin increased to 40 mg daily.  Today he presents for 62-month follow-up appointment.  He remains very active and admits to intermittent sensation of breathing harder when working, admits to myalgias, used to be able to walk 2 miles and now not able to do that. Denies any chest pain, palpitations, syncope, presyncope, dizziness, orthopnea, PND, swelling or significant weight changes, acute bleeding, or claudication.  ROS: Negative. See HPI.   Studies Reviewed: [name]    EKG:      Vascular ultrasound lower extremity arterial duplex 09/2021: Summary:  Right: Resting right ankle-brachial index is within normal range. No  evidence of significant right lower extremity arterial disease. The right  toe-brachial index is normal.   Left: Resting left ankle-brachial index is within normal range. No  evidence of significant left lower extremity arterial disease. The left  toe-brachial index is normal.  Echo 05/2020: 1. Left ventricular ejection fraction, by estimation, is 60 to 65%. The  left ventricle has normal function. The left ventricle has no regional  wall motion abnormalities. There is mild left ventricular hypertrophy.  Left  ventricular diastolic parameters  are consistent with Grade I diastolic dysfunction (impaired relaxation).  The average left ventricular global longitudinal strain is 17.7 %. The  global longitudinal strain is normal.   2. Right ventricular systolic function is normal. The right ventricular  size is normal.   3. The mitral valve is normal in structure. No evidence of mitral valve  regurgitation. No evidence of mitral stenosis.   4. The aortic valve is tricuspid. Aortic valve regurgitation is not  visualized. No aortic stenosis is present.   Comparison(s): Echocardiogram done 04/25/16 showed an EF of 65-70%.  Lexiscan 2012: Normal study, low risk scan.  Physical Exam:   VS:  There were no vitals taken for this visit.   Wt Readings from Last 3 Encounters:  11/27/22 246 lb (111.6 kg)  11/19/22 244 lb 3.2 oz (110.8 kg)  11/08/22 244 lb (110.7 kg)    GEN: Obese, 72 y.o. male in no acute distress NECK: No JVD; No carotid bruits CARDIAC: S1/S2, RRR, no murmurs, rubs, gallops RESPIRATORY:  Clear to auscultation without rales, wheezing or rhonchi  EXTREMITIES:  No edema; No deformity   ASSESSMENT AND PLAN: .   CAD, DOE Has noted intermittent for past 2-3 months sensation of breathing harder when working. Denies any chest pain. Etiology multifactorial. Will update Echocardiogram at this time to evaluate for any wall motion abnormalities and EF to explain DOE. If test comes back benign, may need to consider ischemic evaluation. Continue aspirin, fenofibrate, losartan, Toprol XL, stopping Pravastatin - see below. Heart healthy diet encouraged. Care and ED precautions discussed.   HTN  BP stable. No medication changes at this time. Discussed to monitor BP at home at least 2 hours after medications and sitting for 5-10 minutes. Heart healthy diet encouraged.   HLD, myalgia d/t statin, statin intolerance Admits to myalgias. Hx of statin intolerance. Will stop pravastatin. Will refer to Lipid  Clinic to discuss PCSK9i/Leqvio. Heart healthy diet encouraged.   CKD stage IV Most recent labs stable. Avoid nephrotoxic agents and encouraged adequate hydration. Follow-up with Nephrology and PCP as scheduled.   Dispo: Follow-up with me/APP in 6 weeks or sooner if anything changes.   Signed, Sharlene Dory, NP

## 2022-12-31 NOTE — Telephone Encounter (Signed)
Checking percert on the following patient for testing scheduled at [facility].    Lexiscan-  01/09/2023

## 2022-12-31 NOTE — Patient Instructions (Addendum)
Medication Instructions:   Your physician recommends that you continue on your current medications as directed. Please refer to the Current Medication list given to you today.  Labwork:  None  Testing/Procedures: Your physician has requested that you have a lexiscan myoview. For further information please visit [email or web address]. Please follow instruction sheet, as given.  Follow-Up:  Your physician recommends that you schedule a follow-up appointment in: 6 weeks.  Any Other Special Instructions Will Be Listed Below (If Applicable).  If you need a refill on your cardiac medications before your next appointment, please call your pharmacy.

## 2023-01-09 ENCOUNTER — Encounter (HOSPITAL_COMMUNITY): Payer: Self-pay

## 2023-01-09 ENCOUNTER — Ambulatory Visit (HOSPITAL_COMMUNITY)
Admission: RE | Admit: 2023-01-09 | Discharge: 2023-01-09 | Disposition: A | Discharge status: Home / Self Care | Payer: Medicare Other | Source: Ambulatory Visit | Attending: Cardiology | Admitting: Cardiology

## 2023-01-09 ENCOUNTER — Ambulatory Visit (HOSPITAL_COMMUNITY)
Admission: RE | Admit: 2023-01-09 | Discharge: 2023-01-09 | Disposition: A | Discharge status: Home / Self Care | Payer: Medicare Other | Source: Ambulatory Visit | Attending: Nurse Practitioner | Admitting: Nurse Practitioner

## 2023-01-09 DIAGNOSIS — R0609 Other forms of dyspnea: Secondary | ICD-10-CM

## 2023-01-09 HISTORY — DX: Disorder of kidney and ureter, unspecified: N28.9

## 2023-01-09 LAB — NM MYOCAR MULTI W/SPECT W/WALL MOTION / EF
Base ST Depression (mm): 0 mm
LV dias vol: 140 mL (ref 62–150)
LV sys vol: 99 mL
Nuc Stress EF: 29 %
Peak HR: 75 {beats}/min
RATE: 0.4
Rest HR: 58 {beats}/min
Rest Nuclear Isotope Dose: 11 mCi
SDS: 4
SRS: 4
SSS: 8
ST Depression (mm): 0 mm
Stress Nuclear Isotope Dose: 33 mCi
TID: 1.21

## 2023-01-09 MED ORDER — TECHNETIUM TC 99M TETROFOSMIN IV KIT
30.0000 | PACK | Freq: Once | INTRAVENOUS | Status: AC | PRN
  Administered 2023-01-09: 33 via INTRAVENOUS

## 2023-01-09 MED ORDER — SODIUM CHLORIDE FLUSH 0.9 % IV SOLN
INTRAVENOUS | Status: AC
  Administered 2023-01-09: 10 mL via INTRAVENOUS
  Filled 2023-01-09: qty 10

## 2023-01-09 MED ORDER — TECHNETIUM TC 99M TETROFOSMIN IV KIT
10.0000 | PACK | Freq: Once | INTRAVENOUS | Status: AC | PRN
  Administered 2023-01-09: 11 via INTRAVENOUS

## 2023-01-09 MED ORDER — REGADENOSON 0.4 MG/5ML IV SOLN
INTRAVENOUS | Status: AC
  Administered 2023-01-09: 0.4 mg via INTRAVENOUS
  Filled 2023-01-09: qty 5

## 2023-01-19 DIAGNOSIS — E782 Mixed hyperlipidemia: Secondary | ICD-10-CM | POA: Diagnosis not present

## 2023-01-19 DIAGNOSIS — I10 Essential (primary) hypertension: Secondary | ICD-10-CM | POA: Diagnosis not present

## 2023-01-19 DIAGNOSIS — G720 Drug-induced myopathy: Secondary | ICD-10-CM | POA: Diagnosis not present

## 2023-01-19 DIAGNOSIS — E1159 Type 2 diabetes mellitus with other circulatory complications: Secondary | ICD-10-CM | POA: Diagnosis not present

## 2023-01-19 DIAGNOSIS — N184 Chronic kidney disease, stage 4 (severe): Secondary | ICD-10-CM | POA: Diagnosis not present

## 2023-01-25 DIAGNOSIS — N184 Chronic kidney disease, stage 4 (severe): Secondary | ICD-10-CM | POA: Diagnosis not present

## 2023-01-28 DIAGNOSIS — E1122 Type 2 diabetes mellitus with diabetic chronic kidney disease: Secondary | ICD-10-CM | POA: Diagnosis not present

## 2023-01-28 DIAGNOSIS — D631 Anemia in chronic kidney disease: Secondary | ICD-10-CM | POA: Diagnosis not present

## 2023-01-28 DIAGNOSIS — E875 Hyperkalemia: Secondary | ICD-10-CM | POA: Diagnosis not present

## 2023-01-28 DIAGNOSIS — N184 Chronic kidney disease, stage 4 (severe): Secondary | ICD-10-CM | POA: Diagnosis not present

## 2023-01-28 DIAGNOSIS — N2581 Secondary hyperparathyroidism of renal origin: Secondary | ICD-10-CM | POA: Diagnosis not present

## 2023-01-28 DIAGNOSIS — I129 Hypertensive chronic kidney disease with stage 1 through stage 4 chronic kidney disease, or unspecified chronic kidney disease: Secondary | ICD-10-CM | POA: Diagnosis not present

## 2023-01-28 DIAGNOSIS — E872 Acidosis, unspecified: Secondary | ICD-10-CM | POA: Diagnosis not present

## 2023-02-11 ENCOUNTER — Ambulatory Visit: Payer: Medicare Other | Attending: Nurse Practitioner | Admitting: Nurse Practitioner

## 2023-02-11 ENCOUNTER — Encounter: Payer: Self-pay | Admitting: Nurse Practitioner

## 2023-02-11 VITALS — BP 132/70 | HR 68 | Ht 73.0 in | Wt 246.4 lb

## 2023-02-11 DIAGNOSIS — Z789 Other specified health status: Secondary | ICD-10-CM | POA: Diagnosis not present

## 2023-02-11 DIAGNOSIS — R0609 Other forms of dyspnea: Secondary | ICD-10-CM

## 2023-02-11 DIAGNOSIS — I10 Essential (primary) hypertension: Secondary | ICD-10-CM | POA: Diagnosis not present

## 2023-02-11 DIAGNOSIS — E785 Hyperlipidemia, unspecified: Secondary | ICD-10-CM

## 2023-02-11 DIAGNOSIS — I2510 Atherosclerotic heart disease of native coronary artery without angina pectoris: Secondary | ICD-10-CM

## 2023-02-11 DIAGNOSIS — N184 Chronic kidney disease, stage 4 (severe): Secondary | ICD-10-CM | POA: Diagnosis not present

## 2023-02-11 NOTE — Patient Instructions (Addendum)
Medication Instructions:  Your physician recommends that you continue on your current medications as directed. Please refer to the Current Medication list given to you today.  Labwork: None   Testing/Procedures: None   Follow-Up: Your physician recommends that you schedule a follow-up appointment in: 4-6 weeks   Any Other Special Instructions Will Be Listed Below (If Applicable).  If you need a refill on your cardiac medications before your next appointment, please call your pharmacy.

## 2023-02-11 NOTE — Progress Notes (Signed)
Cardiology Office Note:  .   Date:  02/11/2023 ID:  Gabriel Schultz, DOB 25-Jan-1951, MRN [id number] PCP: Assunta Found, MD  [facility] Providers Cardiologist:  Dina Rich, MD    History of Present Illness: .   Gabriel Schultz is a 72 y.o. male with a PMH of CAD, hypertension, hyperlipidemia, leg pains, type 2 diabetes, history of gout, GERD, and CKD stage IV (sees Nephrology), who presents today for 35-month follow-up appointment.  Previous CV history of stenting to the RCA and left circumflex in 2010.  Last seen by Dr. Dina Rich on February 27, 2022.  Blood pressure was above goal at office visit.  Dr. Wyline Mood recommended if blood pressures continue to remain above goal at home would likely add hydralazine given history of renal dysfunction.  Pravastatin increased to 40 mg daily.  11/19/2022 - Today he presents for 25-month follow-up appointment.  He remains very active and admits to intermittent sensation of breathing harder when working, admits to myalgias, used to be able to walk 2 miles and now not able to do that. Denies any chest pain, palpitations, syncope, presyncope, dizziness, orthopnea, PND, swelling or significant weight changes, acute bleeding, or claudication.  12/31/2022 -Today presents for follow-up.  He notes feeling fatigue and stable intermittent sensation of breathing harder when working.  Denies any chest pain, palpitations, syncope, presyncope, dizziness, orthopnea, PND, swelling or significant weight changes, acute bleeding, or claudication.  Also admits to intermittent pain in his gums that started about 1 month ago and intermittent nausea, denies any recent sick contacts. States that he wears dentures.   02/11/2023 - Continues to note stable fatigue and intermittent DOE, still remains active. Denies any chest pain, palpitations, syncope, presyncope, dizziness, orthopnea, PND, swelling or significant weight changes, acute bleeding, or claudication.  ROS:  Negative. See HPI.   Studies Reviewed: [name]    EKG: EKG is not ordered today.  EKG dated November 19, 2022 revealed normal sinus rhythm, 61 bpm, nonspecific T wave abnormality.  Lexiscan 12/2022:    Baseline EKG showed normal sinus rhythm and LBBB. Stress EKG is negative for ischemia and arrhythmias.   LV perfusion is abnormal. There is no evidence of ischemia. There is evidence of infarction. There is a medium sized fixed perfusion defect with moderate reduction in uptake present in the entire inferior wall with abnormal wall motion in the defect area consistent with infarction.   Left ventricular function is abnormal. Nuclear stress EF: 29%. End diastolic cavity size is severely enlarged. End systolic cavity size is mildly enlarged.   Findings are consistent with infarction in the entire inferior wall and no ischemia. The study is high risk due to nuclear LVEF 29%. Correlate with 2D Echo.  Echo 11/2022: 1. Left ventricular ejection fraction, by estimation, is 55 to 60%. The  left ventricle has normal function. The left ventricle has no regional  wall motion abnormalities. There is mild left ventricular hypertrophy.  Left ventricular diastolic parameters  are consistent with Grade II diastolic dysfunction (pseudonormalization).   2. Right ventricular systolic function is normal. The right ventricular  size is normal. There is normal pulmonary artery systolic pressure. The  estimated right ventricular systolic pressure is 28.8 mmHg.   3. Left atrial size was mildly dilated.   4. The mitral valve is normal in structure. Trivial mitral valve  regurgitation. No evidence of mitral stenosis.   5. The aortic valve is tricuspid. There is mild calcification of the  aortic valve. Aortic valve regurgitation  is trivial. Aortic valve  sclerosis/calcification is present, without any evidence of aortic  stenosis.   6. The inferior vena cava is normal in size with greater than 50%  respiratory  variability, suggesting right atrial pressure of 3 mmHg.  Vascular ultrasound lower extremity arterial duplex 09/2021: Summary:  Right: Resting right ankle-brachial index is within normal range. No  evidence of significant right lower extremity arterial disease. The right  toe-brachial index is normal.   Left: Resting left ankle-brachial index is within normal range. No  evidence of significant left lower extremity arterial disease. The left  toe-brachial index is normal.  Echo 05/2020: 1. Left ventricular ejection fraction, by estimation, is 60 to 65%. The  left ventricle has normal function. The left ventricle has no regional  wall motion abnormalities. There is mild left ventricular hypertrophy.  Left ventricular diastolic parameters  are consistent with Grade I diastolic dysfunction (impaired relaxation).  The average left ventricular global longitudinal strain is 17.7 %. The  global longitudinal strain is normal.   2. Right ventricular systolic function is normal. The right ventricular  size is normal.   3. The mitral valve is normal in structure. No evidence of mitral valve  regurgitation. No evidence of mitral stenosis.   4. The aortic valve is tricuspid. Aortic valve regurgitation is not  visualized. No aortic stenosis is present.   Comparison(s): Echocardiogram done 04/25/16 showed an EF of 65-70%.  Lexiscan 2012: Normal study, low risk scan.  Physical Exam:   VS:  BP 132/70   Pulse 68   Ht 6\' 1"  (1.854 m)   Wt 246 lb 6.4 oz (111.8 kg)   SpO2 95%   BMI 32.51 kg/m    Wt Readings from Last 3 Encounters:  02/11/23 246 lb 6.4 oz (111.8 kg)  12/31/22 247 lb 6.4 oz (112.2 kg)  11/27/22 246 lb (111.6 kg)    GEN: Obese, 72 y.o. male in no acute distress NECK: No JVD; No carotid bruits CARDIAC: S1/S2, RRR, no murmurs, rubs, gallops RESPIRATORY:  Clear to auscultation without rales, wheezing or rhonchi  EXTREMITIES:  No edema; No deformity   ASSESSMENT AND PLAN: .    CAD, DOE Continues to note stable intermittent sensation of breathing harder when working. Denies any chest pain. Etiology multifactorial.  Recent echo revealed normal EF, no RWMA, grade 2 DD. Lexiscan performed recently was negative for ischemia/arrhthymias, with evidence of past infarction. Hx of past stenting to RCA/LCX in 2010. Discussed tx options with patient and pt requests medical management at this time. Will route note to attending cardiologist regarding recs. Continue aspirin, fenofibrate, losartan, Toprol XL, and Repatha. Heart healthy diet encouraged. Care and ED precautions discussed.   HTN BP stable. No medication changes at this time. Discussed to monitor BP at home at least 2 hours after medications and sitting for 5-10 minutes. Heart healthy diet encouraged.   HLD, statin intolerance Has been followed by lipid clinic.  Tolerating Repatha well.  Heart healthy diet encouraged.  Continue to follow-up with lipid clinic.  CKD stage IV Most recent labs around baseline. Avoid nephrotoxic agents and encouraged adequate hydration. Follow-up with Nephrology and PCP as scheduled.  Dispo: Follow-up with me/APP in 4-6 weeks or sooner if anything changes.   Signed, Sharlene Dory, NP

## 2023-02-15 ENCOUNTER — Telehealth: Payer: Self-pay | Admitting: Cardiology

## 2023-02-15 ENCOUNTER — Telehealth: Payer: Self-pay

## 2023-02-15 DIAGNOSIS — R0609 Other forms of dyspnea: Secondary | ICD-10-CM

## 2023-02-15 MED ORDER — FUROSEMIDE 20 MG PO TABS: 20.0000 mg | ORAL_TABLET | Freq: Every day | ORAL | 6 refills | Status: DC

## 2023-02-15 NOTE — Addendum Note (Signed)
Addended by: Kerney Elbe on: 02/15/2023 04:31 PM   Modules accepted: Orders

## 2023-02-15 NOTE — Telephone Encounter (Signed)
-----   Message from Sharlene Dory sent at 02/15/2023 10:14 AM EST ----- Dr. Wyline Mood gave okay to start diuretic. Please send in Rx for Lasix 20 mg daily and let's obtain a BMET, proBNP, and Magnesium level for diagnosis of "dyspnea on exertion." I recommend for the labs to be obtained in 1 week.   Thank you!   Best,  Sharlene Dory, NP ----- Message ----- From: Antoine Poche, MD Sent: 02/14/2023   9:38 AM EST To: Sharlene Dory, NP  Ok to try diueretic   Dominga Ferry MD ----- Message ----- From: Sharlene Dory, NP Sent: 02/11/2023  11:57 AM EST To: Antoine Poche, MD  Recent lexiscan negative for ischemia, noted infarction in past. Still notes DOE. Okay to begin Lasix 20 mg daily to see if this improves symptoms? Does have CKD stage IV and would run this also past his Nephrologist.  Appreciate your input.   Thank you!   Kind Regards,  Sharlene Dory, NP

## 2023-02-15 NOTE — Telephone Encounter (Signed)
Pt notified to start lasix 20 mg daily and have labs done in one week.

## 2023-02-15 NOTE — Telephone Encounter (Signed)
Patient returned staff call regarding medication management (see previous note).

## 2023-02-15 NOTE — Telephone Encounter (Signed)
Left message for patient to call back  

## 2023-02-19 DIAGNOSIS — G720 Drug-induced myopathy: Secondary | ICD-10-CM | POA: Diagnosis not present

## 2023-02-19 DIAGNOSIS — E1159 Type 2 diabetes mellitus with other circulatory complications: Secondary | ICD-10-CM | POA: Diagnosis not present

## 2023-02-19 DIAGNOSIS — K219 Gastro-esophageal reflux disease without esophagitis: Secondary | ICD-10-CM | POA: Diagnosis not present

## 2023-03-15 ENCOUNTER — Ambulatory Visit: Payer: Medicare Other | Attending: Nurse Practitioner | Admitting: Nurse Practitioner

## 2023-03-15 VITALS — BP 130/64 | HR 68 | Ht 73.0 in | Wt 251.8 lb

## 2023-03-15 DIAGNOSIS — I2510 Atherosclerotic heart disease of native coronary artery without angina pectoris: Secondary | ICD-10-CM

## 2023-03-15 DIAGNOSIS — Z789 Other specified health status: Secondary | ICD-10-CM

## 2023-03-15 DIAGNOSIS — N184 Chronic kidney disease, stage 4 (severe): Secondary | ICD-10-CM

## 2023-03-15 DIAGNOSIS — I10 Essential (primary) hypertension: Secondary | ICD-10-CM

## 2023-03-15 DIAGNOSIS — R0609 Other forms of dyspnea: Secondary | ICD-10-CM | POA: Diagnosis not present

## 2023-03-15 DIAGNOSIS — E669 Obesity, unspecified: Secondary | ICD-10-CM

## 2023-03-15 DIAGNOSIS — E785 Hyperlipidemia, unspecified: Secondary | ICD-10-CM | POA: Diagnosis not present

## 2023-03-15 NOTE — Progress Notes (Signed)
Cardiology Office Note:  .   Date:  03/15/2023 ID:  Gabriel Schultz, DOB [birth year], MRN [id number] PCP: Assunta Found, MD  [facility] Providers Cardiologist:  Dina Rich, MD    History of Present Illness: .   Gabriel Schultz is a 73 y.o. male with a PMH of CAD, hypertension, hyperlipidemia, leg pains, type 2 diabetes, history of gout, GERD, and CKD stage IV (sees Nephrology), and DOE, who presents today for follow-up appointment.  Previous CV history of stenting to the RCA and left circumflex in 2010.  Last seen by Dr. Dina Rich on February 27, 2022.  Blood pressure was above goal at office visit.  Dr. Wyline Mood recommended if blood pressures continue to remain above goal at home would likely add hydralazine given history of renal dysfunction.  Pravastatin increased to 40 mg daily.  I last saw him for follow-up on February 11, 2023.  He continues to note stable fatigue and intermittent DOE, still remains active. Denied any chest pain, palpitations, syncope, presyncope, dizziness, orthopnea, PND, swelling or significant weight changes, acute bleeding, or claudication.  Consulted his cardiologist who agreed with starting him on low-dose Lasix to see if this would improve his symptoms.  Today he presents for follow-up.  He does note improvement since I last saw him.  Admits to shortness of breath with bending over, attributes this to his weight. Denies any chest pain, palpitations, syncope, presyncope, dizziness, orthopnea, PND, swelling or significant weight changes, acute bleeding, or claudication.  ROS: Negative. See HPI.   Studies Reviewed: [name]    EKG: EKG is not ordered today.  EKG dated November 19, 2022 revealed normal sinus rhythm, 61 bpm, nonspecific T wave abnormality.  Lexiscan 12/2022:    Baseline EKG showed normal sinus rhythm and LBBB. Stress EKG is negative for ischemia and arrhythmias.   LV perfusion is abnormal. There is no evidence of ischemia. There is  evidence of infarction. There is a medium sized fixed perfusion defect with moderate reduction in uptake present in the entire inferior wall with abnormal wall motion in the defect area consistent with infarction.   Left ventricular function is abnormal. Nuclear stress EF: 29%. End diastolic cavity size is severely enlarged. End systolic cavity size is mildly enlarged.   Findings are consistent with infarction in the entire inferior wall and no ischemia. The study is high risk due to nuclear LVEF 29%. Correlate with 2D Echo.  Echo 11/2022: 1. Left ventricular ejection fraction, by estimation, is 55 to 60%. The  left ventricle has normal function. The left ventricle has no regional  wall motion abnormalities. There is mild left ventricular hypertrophy.  Left ventricular diastolic parameters  are consistent with Grade II diastolic dysfunction (pseudonormalization).   2. Right ventricular systolic function is normal. The right ventricular  size is normal. There is normal pulmonary artery systolic pressure. The  estimated right ventricular systolic pressure is 28.8 mmHg.   3. Left atrial size was mildly dilated.   4. The mitral valve is normal in structure. Trivial mitral valve  regurgitation. No evidence of mitral stenosis.   5. The aortic valve is tricuspid. There is mild calcification of the  aortic valve. Aortic valve regurgitation is trivial. Aortic valve  sclerosis/calcification is present, without any evidence of aortic  stenosis.   6. The inferior vena cava is normal in size with greater than 50%  respiratory variability, suggesting right atrial pressure of 3 mmHg.  Vascular ultrasound lower extremity arterial duplex 09/2021: Summary:  Right:  Resting right ankle-brachial index is within normal range. No  evidence of significant right lower extremity arterial disease. The right  toe-brachial index is normal.   Left: Resting left ankle-brachial index is within normal range. No   evidence of significant left lower extremity arterial disease. The left  toe-brachial index is normal.  Echo 05/2020: 1. Left ventricular ejection fraction, by estimation, is 60 to 65%. The  left ventricle has normal function. The left ventricle has no regional  wall motion abnormalities. There is mild left ventricular hypertrophy.  Left ventricular diastolic parameters  are consistent with Grade I diastolic dysfunction (impaired relaxation).  The average left ventricular global longitudinal strain is 17.7 %. The  global longitudinal strain is normal.   2. Right ventricular systolic function is normal. The right ventricular  size is normal.   3. The mitral valve is normal in structure. No evidence of mitral valve  regurgitation. No evidence of mitral stenosis.   4. The aortic valve is tricuspid. Aortic valve regurgitation is not  visualized. No aortic stenosis is present.   Comparison(s): Echocardiogram done 04/25/16 showed an EF of 65-70%.  Lexiscan 2012: Normal study, low risk scan.  Physical Exam:   VS:  BP 130/64   Pulse 68   Ht 6\' 1"  (1.854 m)   Wt 251 lb 12.8 oz (114.2 kg)   SpO2 97%   BMI 33.22 kg/m    Wt Readings from Last 3 Encounters:  03/15/23 251 lb 12.8 oz (114.2 kg)  02/11/23 246 lb 6.4 oz (111.8 kg)  12/31/22 247 lb 6.4 oz (112.2 kg)    GEN: Obese, 73 y.o. male in no acute distress NECK: No JVD; No carotid bruits CARDIAC: S1/S2, RRR, no murmurs, rubs, gallops RESPIRATORY:  Clear to auscultation without rales, wheezing or rhonchi  EXTREMITIES:  No edema; No deformity   ASSESSMENT AND PLAN: .   CAD, DOE Notes improved intermittent sensation of breathing harder when working. Denies any chest pain. Etiology multifactorial.  Recent echo revealed normal EF, no RWMA, grade 2 DD. Lexiscan performed was negative for ischemia/arrhthymias, with evidence of past infarction. Hx of past stenting to RCA/LCX in 2010. Continue current medication regimen. Heart healthy diet  encouraged. Care and ED precautions discussed. Will obtain previously ordered labs (magnesium, BMET, and proBNP) to be obtained with his nephrologist soon per patient's request.  HTN BP stable. No medication changes at this time. Discussed to monitor BP at home at least 2 hours after medications and sitting for 5-10 minutes. Heart healthy diet encouraged.   HLD, statin intolerance Has been followed by lipid clinic.  Tolerating Repatha well.  Heart healthy diet encouraged.  Continue to follow-up with lipid clinic.  CKD stage IV Most recent labs around baseline. Avoid nephrotoxic agents and encouraged adequate hydration. Follow-up with Nephrology and PCP as scheduled.  He has upcoming labs and nephrology soon-will obtain previously ordered labs as mentioned above.  5.  Obesity Weight loss via diet and exercise encouraged. Discussed the impact being overweight would have on cardiovascular risk.  Dispo: Follow-up with Dr. Dina Rich or APP in 6 months or sooner if anything changes.   Signed, Sharlene Dory, NP

## 2023-03-15 NOTE — Patient Instructions (Addendum)
Medication Instructions:  Your physician recommends that you continue on your current medications as directed. Please refer to the Current Medication list given to you today.   Labwork: Printed out previous labs to be completed  Testing/Procedures: None  Follow-Up: Your physician recommends that you schedule a follow-up appointment in: 6 months   Any Other Special Instructions Will Be Listed Below (If Applicable).  Thank you for choosing [facility]!      If you need a refill on your cardiac medications before your next appointment, please call your pharmacy.

## 2023-03-25 DIAGNOSIS — R0609 Other forms of dyspnea: Secondary | ICD-10-CM | POA: Diagnosis not present

## 2023-03-25 DIAGNOSIS — N184 Chronic kidney disease, stage 4 (severe): Secondary | ICD-10-CM | POA: Diagnosis not present

## 2023-03-26 LAB — PRO B NATRIURETIC PEPTIDE: NT-Pro BNP: 329 pg/mL (ref 0–376)

## 2023-03-26 LAB — BASIC METABOLIC PANEL
BUN/Creatinine Ratio: 13 (ref 10–24)
BUN: 41 mg/dL — ABNORMAL HIGH (ref 8–27)
CO2: 19 mmol/L — ABNORMAL LOW (ref 20–29)
Calcium: 9.8 mg/dL (ref 8.6–10.2)
Chloride: 106 mmol/L (ref 96–106)
Creatinine, Ser: 3.25 mg/dL — ABNORMAL HIGH (ref 0.76–1.27)
Glucose: 169 mg/dL — ABNORMAL HIGH (ref 70–99)
Potassium: 5.2 mmol/L (ref 3.5–5.2)
Sodium: 142 mmol/L (ref 134–144)
eGFR: 19 mL/min/{1.73_m2} — ABNORMAL LOW (ref >59)

## 2023-03-26 LAB — MAGNESIUM: Magnesium: 2.2 mg/dL (ref 1.6–2.3)

## 2023-03-27 ENCOUNTER — Telehealth: Payer: Self-pay | Admitting: Nurse Practitioner

## 2023-03-27 NOTE — Telephone Encounter (Signed)
  Pt is returning call to get lab result   Advised that since its 4:50 pm he will get a callback tomorrow

## 2023-03-28 NOTE — Telephone Encounter (Signed)
 Patient informed and verbalized understanding of plan.

## 2023-04-01 DIAGNOSIS — D631 Anemia in chronic kidney disease: Secondary | ICD-10-CM | POA: Diagnosis not present

## 2023-04-01 DIAGNOSIS — E1122 Type 2 diabetes mellitus with diabetic chronic kidney disease: Secondary | ICD-10-CM | POA: Diagnosis not present

## 2023-04-01 DIAGNOSIS — E875 Hyperkalemia: Secondary | ICD-10-CM | POA: Diagnosis not present

## 2023-04-01 DIAGNOSIS — E872 Acidosis, unspecified: Secondary | ICD-10-CM | POA: Diagnosis not present

## 2023-04-01 DIAGNOSIS — N2581 Secondary hyperparathyroidism of renal origin: Secondary | ICD-10-CM | POA: Diagnosis not present

## 2023-04-01 DIAGNOSIS — N189 Chronic kidney disease, unspecified: Secondary | ICD-10-CM | POA: Diagnosis not present

## 2023-04-01 DIAGNOSIS — N184 Chronic kidney disease, stage 4 (severe): Secondary | ICD-10-CM | POA: Diagnosis not present

## 2023-04-01 DIAGNOSIS — I129 Hypertensive chronic kidney disease with stage 1 through stage 4 chronic kidney disease, or unspecified chronic kidney disease: Secondary | ICD-10-CM | POA: Diagnosis not present

## 2023-04-05 DIAGNOSIS — J040 Acute laryngitis: Secondary | ICD-10-CM | POA: Diagnosis not present

## 2023-04-05 DIAGNOSIS — R051 Acute cough: Secondary | ICD-10-CM | POA: Diagnosis not present

## 2023-05-27 DIAGNOSIS — U071 COVID-19: Secondary | ICD-10-CM | POA: Diagnosis not present

## 2023-06-11 DIAGNOSIS — N184 Chronic kidney disease, stage 4 (severe): Secondary | ICD-10-CM | POA: Diagnosis not present

## 2023-07-20 DIAGNOSIS — E1159 Type 2 diabetes mellitus with other circulatory complications: Secondary | ICD-10-CM | POA: Diagnosis not present

## 2023-07-20 DIAGNOSIS — G720 Drug-induced myopathy: Secondary | ICD-10-CM | POA: Diagnosis not present

## 2023-07-20 DIAGNOSIS — I2510 Atherosclerotic heart disease of native coronary artery without angina pectoris: Secondary | ICD-10-CM | POA: Diagnosis not present

## 2023-07-20 DIAGNOSIS — E782 Mixed hyperlipidemia: Secondary | ICD-10-CM | POA: Diagnosis not present

## 2023-08-08 DIAGNOSIS — N1832 Chronic kidney disease, stage 3b: Secondary | ICD-10-CM | POA: Diagnosis not present

## 2023-08-19 DIAGNOSIS — E875 Hyperkalemia: Secondary | ICD-10-CM | POA: Diagnosis not present

## 2023-08-19 DIAGNOSIS — D631 Anemia in chronic kidney disease: Secondary | ICD-10-CM | POA: Diagnosis not present

## 2023-08-19 DIAGNOSIS — I129 Hypertensive chronic kidney disease with stage 1 through stage 4 chronic kidney disease, or unspecified chronic kidney disease: Secondary | ICD-10-CM | POA: Diagnosis not present

## 2023-08-19 DIAGNOSIS — E1122 Type 2 diabetes mellitus with diabetic chronic kidney disease: Secondary | ICD-10-CM | POA: Diagnosis not present

## 2023-08-19 DIAGNOSIS — N184 Chronic kidney disease, stage 4 (severe): Secondary | ICD-10-CM | POA: Diagnosis not present

## 2023-08-19 DIAGNOSIS — N2581 Secondary hyperparathyroidism of renal origin: Secondary | ICD-10-CM | POA: Diagnosis not present

## 2023-08-19 DIAGNOSIS — E872 Acidosis, unspecified: Secondary | ICD-10-CM | POA: Diagnosis not present

## 2023-08-27 DIAGNOSIS — J22 Unspecified acute lower respiratory infection: Secondary | ICD-10-CM | POA: Diagnosis not present

## 2023-08-27 DIAGNOSIS — R059 Cough, unspecified: Secondary | ICD-10-CM | POA: Diagnosis not present

## 2023-09-03 DIAGNOSIS — J069 Acute upper respiratory infection, unspecified: Secondary | ICD-10-CM | POA: Diagnosis not present

## 2023-09-26 ENCOUNTER — Encounter (INDEPENDENT_AMBULATORY_CARE_PROVIDER_SITE_OTHER): Payer: Self-pay | Admitting: *Deleted

## 2023-10-08 DIAGNOSIS — N184 Chronic kidney disease, stage 4 (severe): Secondary | ICD-10-CM | POA: Diagnosis not present

## 2023-10-10 ENCOUNTER — Other Ambulatory Visit: Payer: Self-pay | Admitting: Nurse Practitioner

## 2023-10-10 DIAGNOSIS — E1129 Type 2 diabetes mellitus with other diabetic kidney complication: Secondary | ICD-10-CM | POA: Diagnosis not present

## 2023-10-10 DIAGNOSIS — N184 Chronic kidney disease, stage 4 (severe): Secondary | ICD-10-CM | POA: Diagnosis not present

## 2023-10-10 DIAGNOSIS — I10 Essential (primary) hypertension: Secondary | ICD-10-CM | POA: Diagnosis not present

## 2023-10-10 DIAGNOSIS — I2510 Atherosclerotic heart disease of native coronary artery without angina pectoris: Secondary | ICD-10-CM | POA: Diagnosis not present

## 2023-10-10 DIAGNOSIS — G720 Drug-induced myopathy: Secondary | ICD-10-CM | POA: Diagnosis not present

## 2023-10-10 DIAGNOSIS — Z0001 Encounter for general adult medical examination with abnormal findings: Secondary | ICD-10-CM | POA: Diagnosis not present

## 2023-10-14 DIAGNOSIS — D631 Anemia in chronic kidney disease: Secondary | ICD-10-CM | POA: Diagnosis not present

## 2023-10-14 DIAGNOSIS — I129 Hypertensive chronic kidney disease with stage 1 through stage 4 chronic kidney disease, or unspecified chronic kidney disease: Secondary | ICD-10-CM | POA: Diagnosis not present

## 2023-10-14 DIAGNOSIS — N2581 Secondary hyperparathyroidism of renal origin: Secondary | ICD-10-CM | POA: Diagnosis not present

## 2023-10-14 DIAGNOSIS — E1122 Type 2 diabetes mellitus with diabetic chronic kidney disease: Secondary | ICD-10-CM | POA: Diagnosis not present

## 2023-10-14 DIAGNOSIS — N184 Chronic kidney disease, stage 4 (severe): Secondary | ICD-10-CM | POA: Diagnosis not present

## 2023-10-17 DIAGNOSIS — B029 Zoster without complications: Secondary | ICD-10-CM | POA: Diagnosis not present

## 2023-11-25 ENCOUNTER — Other Ambulatory Visit: Payer: Self-pay | Admitting: Cardiology

## 2023-11-25 ENCOUNTER — Other Ambulatory Visit: Payer: Self-pay | Admitting: Nurse Practitioner

## 2023-11-25 DIAGNOSIS — E782 Mixed hyperlipidemia: Secondary | ICD-10-CM

## 2023-11-25 DIAGNOSIS — I2510 Atherosclerotic heart disease of native coronary artery without angina pectoris: Secondary | ICD-10-CM

## 2023-12-05 ENCOUNTER — Encounter: Payer: Self-pay | Admitting: Cardiology

## 2023-12-05 ENCOUNTER — Ambulatory Visit: Attending: Cardiology | Admitting: Cardiology

## 2023-12-05 VITALS — BP 132/72 | HR 67 | Ht 73.0 in | Wt 228.4 lb

## 2023-12-05 DIAGNOSIS — I2510 Atherosclerotic heart disease of native coronary artery without angina pectoris: Secondary | ICD-10-CM

## 2023-12-05 DIAGNOSIS — Z79899 Other long term (current) drug therapy: Secondary | ICD-10-CM | POA: Diagnosis not present

## 2023-12-05 DIAGNOSIS — E782 Mixed hyperlipidemia: Secondary | ICD-10-CM

## 2023-12-05 DIAGNOSIS — I10 Essential (primary) hypertension: Secondary | ICD-10-CM | POA: Diagnosis not present

## 2023-12-05 NOTE — Progress Notes (Signed)
 Clinical Summary Mr. Sagan is a 73 y.o.male seen today for follow up of the following medical problems.    1.CAD - 2010 stenting to the RCA and LCX -05/2020 echo LVEF 60-65%, no WMAs, grade I dd, normal RV     11/2022 echo: LVEF 55-60%, no WMAs, grade II dd, normal RV function 12/2022 nuclear stress: prior infarction, no current ischemia  - no chest pains, no SOB/DOE - compliant with meds   2. Leg pains - fatigue, aching leg muscles with walking long distance. Resolves with rest  09/2021 normal ABIs   3. HTN - compliant with meds     4. Hyperlipidemia - reports prior side effects on stronger statins. Has tried multiple ones.  - currently on repatha , due for repeat lipid panel   5.GERD - followed by GI     6. CKD IV - followed by neprholog Dr Dennise  7. Chronic LBBB - was noted at 12/2022 stress test, by 10/2022 EKG was not present - chronic LBBB today by EKG   AAA screen 2017 CT A/P no aneurysm  Past Medical History:  Diagnosis Date   Arthritis    Coronary artery disease    Diabetes mellitus without complication (HCC)    GERD (gastroesophageal reflux disease)    Gout    High cholesterol    Hypertension    Myocardial infarction [facility])    2003   Renal insufficiency      Allergies  Allergen Reactions   Pravastatin  Other (See Comments)    Myalgia     Current Outpatient Medications  Medication Sig Dispense Refill   allopurinol (ZYLOPRIM) 100 MG tablet Take 100 mg by mouth daily. In the evening     ALPRAZolam (XANAX) 0.5 MG tablet Take 0.5 mg by mouth 3 (three) times daily as needed.     amLODipine  (NORVASC ) 10 MG tablet Take 10 mg by mouth every evening.      aspirin EC 81 MG tablet Take 81 mg by mouth every evening.      Blood Glucose Monitoring Suppl (ONETOUCH VERIO FLEX SYSTEM) w/Device KIT by Does not apply route.     Blood Glucose Monitoring Suppl (ONETOUCH VERIO) w/Device KIT 1 each by Does not apply route in the morning, at noon, in the  evening, and at bedtime. 1 kit 0   fluticasone (FLONASE) 50 MCG/ACT nasal spray Place 1-2 sprays into both nostrils 2 (two) times daily as needed for allergies or rhinitis.      glipiZIDE  (GLUCOTROL  XL) 5 MG 24 hr tablet Take 1 tablet (5 mg total) by mouth daily with breakfast. 90 tablet 3   glucose blood (ONETOUCH VERIO) test strip Use as instructed to check blood sugar four times daily. 300 each 3   glucose blood test strip 1 each by Other route as needed for other. Use as instructed     Lancets (ONETOUCH ULTRASOFT) lancets Use as instructed to check blood sugars four times daily. 100 each 12   losartan (COZAAR) 50 MG tablet Take 50 mg by mouth daily.     metoprolol  succinate (TOPROL -XL) 50 MG 24 hr tablet Take 50 mg by mouth daily.     MOUNJARO 2.5 MG/0.5ML Pen Inject into the skin once a week.     oxyCODONE  (ROXICODONE ) 5 MG immediate release tablet Take 1 tablet (5 mg total) by mouth every 4 (four) hours as needed for severe pain or breakthrough pain. 4 tablet 0   pantoprazole  (PROTONIX ) 40 MG tablet Take  1 tablet (40 mg total) by mouth daily. (Patient taking differently: Take 40 mg by mouth daily as needed.) 30 tablet 11   REPATHA  SURECLICK 140 MG/ML SOAJ INJECT 140 MG INTO THE SKIN EVERY 14 DAYS 2 mL 5   sitaGLIPtin  (JANUVIA ) 50 MG tablet Take 1 tablet (50 mg total) by mouth daily. 90 tablet 1   sodium bicarbonate 650 MG tablet Take 650 mg by mouth 2 (two) times daily.     sodium chloride  (OCEAN) 0.65 % SOLN nasal spray Place 2 sprays into both nostrils 3 (three) times daily as needed for congestion.     fenofibrate 160 MG tablet Take 160 mg by mouth daily. (Patient not taking: Reported on 12/05/2023)     furosemide  (LASIX ) 20 MG tablet Take 1 tablet (20 mg total) by mouth daily. (Patient not taking: Reported on 12/05/2023) 30 tablet 6   No current facility-administered medications for this visit.     Past Surgical History:  Procedure Laterality Date   BIOPSY  05/21/2019   Procedure:  BIOPSY;  Surgeon: Shaaron Lamar HERO, MD;  Location: [facility];  Service: Endoscopy;;   CHOLECYSTECTOMY N/A 07/31/2019   Procedure: LAPAROSCOPIC CHOLECYSTECTOMY;  Surgeon: Kallie Manuelita BROCKS, MD;  Location: [facility];  Service: General;  Laterality: N/A;   COLONOSCOPY N/A 11/04/2018   Sigmoid and descending colon diverticulosis, six 5-11 mm polyps in descending and ascending colon, s/p clips. Tubular adenomas. 5 year surveillance   CORONARY STENT PLACEMENT     ESOPHAGOGASTRODUODENOSCOPY (EGD) WITH PROPOFOL  N/A 05/21/2019    mild incomplete Schatzki ring s/p dilation, duodenal web s/p dilation, s/p esophageal biopsy and gastric biopsy. +Barrett's, negative H.pylori. 3 year surveillance   HERNIA REPAIR Left    inguinal   MALONEY DILATION N/A 05/21/2019   Procedure: AGAPITO DILATION;  Surgeon: Shaaron Lamar HERO, MD;  Location: [facility];  Service: Endoscopy;  Laterality: N/A;   POLYPECTOMY  11/04/2018   Procedure: POLYPECTOMY;  Surgeon: Shaaron Lamar HERO, MD;  Location: [facility];  Service: Endoscopy;;   UMBILICAL HERNIA REPAIR  07/31/2019   Procedure: HERNIA REPAIR UMBILICAL ADULT;  Surgeon: Kallie Manuelita BROCKS, MD;  Location: [facility];  Service: General;;     Allergies  Allergen Reactions   Pravastatin  Other (See Comments)    Myalgia      Family History  Problem Relation Age of Onset   Diabetes Mother    Diabetes Father      Social History Mr. Wirtanen reports that he quit smoking about 35 years ago. His smoking use included cigarettes. He started smoking about 65 years ago. He has a 30 pack-year smoking history. He quit smokeless tobacco use about 7 years ago.  His smokeless tobacco use included chew. Mr. Roser reports that he does not currently use alcohol.     Physical Examination Today's Vitals   12/05/23 0829 12/05/23 0850  BP: 138/72 132/72  Pulse: 67   SpO2: 98%   Weight: 228 lb 6.4 oz (103.6 kg)   Height: 6' 1 (1.854 m)    Body mass index is 30.13 kg/m.  Gen: resting  comfortably, no acute distress HEENT: no scleral icterus, pupils equal round and reactive, no palptable cervical adenopathy,  CV: RRR, no mrg, no jvd Resp: Clear to auscultation bilaterally GI: abdomen is soft, non-tender, non-distended, normal bowel sounds, no hepatosplenomegaly MSK: extremities are warm, no edema.  Skin: warm, no rash Neuro:  no focal deficits Psych: appropriate affect   Diagnostic Studies Echo 04/25/16:   - Left  ventricle: The cavity size was normal. Wall thickness was   increased in a pattern of mild LVH. Systolic function was normal.   The estimated ejection fraction was in the range of 60% to 65%.   Wall motion was normal; there were no regional wall motion   abnormalities. Left ventricular diastolic function parameters   were normal. - Aortic valve: Mildly to moderately calcified annulus. Trileaflet;   mildly thickened leaflets. Valve area (VTI): 2.01 cm^2. Valve   area (Vmax): 1.97 cm^2. - Technically adequate study.     05/2020 echo IMPRESSIONS     1. Left ventricular ejection fraction, by estimation, is 60 to 65%. The  left ventricle has normal function. The left ventricle has no regional  wall motion abnormalities. There is mild left ventricular hypertrophy.  Left ventricular diastolic parameters  are consistent with Grade I diastolic dysfunction (impaired relaxation).  The average left ventricular global longitudinal strain is 17.7 %. The  global longitudinal strain is normal.   2. Right ventricular systolic function is normal. The right ventricular  size is normal.   3. The mitral valve is normal in structure. No evidence of mitral valve  regurgitation. No evidence of mitral stenosis.   4. The aortic valve is tricuspid. Aortic valve regurgitation is not  visualized. No aortic stenosis is present.    09/2021 ABI Summary:  Right: Resting right ankle-brachial index is within normal range. No  evidence of significant right lower extremity arterial  disease. The right  toe-brachial index is normal.   Left: Resting left ankle-brachial index is within normal range. No  evidence of significant left lower extremity arterial disease. The left  toe-brachial index is normal.     11/2022 echo 1. Left ventricular ejection fraction, by estimation, is 55 to 60%. The  left ventricle has normal function. The left ventricle has no regional  wall motion abnormalities. There is mild left ventricular hypertrophy.  Left ventricular diastolic parameters  are consistent with Grade II diastolic dysfunction (pseudonormalization).   2. Right ventricular systolic function is normal. The right ventricular  size is normal. There is normal pulmonary artery systolic pressure. The  estimated right ventricular systolic pressure is 28.8 mmHg.   3. Left atrial size was mildly dilated.   4. The mitral valve is normal in structure. Trivial mitral valve  regurgitation. No evidence of mitral stenosis.   5. The aortic valve is tricuspid. There is mild calcification of the  aortic valve. Aortic valve regurgitation is trivial. Aortic valve  sclerosis/calcification is present, without any evidence of aortic  stenosis.   6. The inferior vena cava is normal in size with greater than 50%  respiratory variability, suggesting right atrial pressure of 3 mmHg.    Assessment and Plan   1.CAD - denies any recent symptoms. Echo and stress last year overall benign  - continue current meds - EKG today shows NSR, chronic LBBB     2. HTN -at goal,c ontinue current meds   3. Hyperlipidemia - repeat lipid panel   F/u 6 months  Dorn PHEBE Ross, M.D.

## 2023-12-05 NOTE — Patient Instructions (Addendum)
 Medication Instructions:   Continue all current medications.    Labwork:  FLP - order given Reminder:  Nothing to eat or drink after 12 midnight prior to labs. Office will contact with results via phone, letter or mychart.     Testing/Procedures:  none  Follow-Up:  6 months   Any Other Special Instructions Will Be Listed Below (If Applicable).   If you need a refill on your cardiac medications before your next appointment, please call your pharmacy.

## 2023-12-06 ENCOUNTER — Encounter: Payer: Self-pay | Admitting: Nurse Practitioner

## 2023-12-06 ENCOUNTER — Ambulatory Visit (INDEPENDENT_AMBULATORY_CARE_PROVIDER_SITE_OTHER): Admitting: Nurse Practitioner

## 2023-12-06 VITALS — BP 136/80 | HR 60 | Ht 73.0 in | Wt 225.8 lb

## 2023-12-06 DIAGNOSIS — E1122 Type 2 diabetes mellitus with diabetic chronic kidney disease: Secondary | ICD-10-CM | POA: Diagnosis not present

## 2023-12-06 DIAGNOSIS — Z7984 Long term (current) use of oral hypoglycemic drugs: Secondary | ICD-10-CM

## 2023-12-06 DIAGNOSIS — N184 Chronic kidney disease, stage 4 (severe): Secondary | ICD-10-CM | POA: Diagnosis not present

## 2023-12-06 LAB — POCT GLYCOSYLATED HEMOGLOBIN (HGB A1C): Hemoglobin A1C: 6.4 % — AB (ref 4.0–5.6)

## 2023-12-06 MED ORDER — TIRZEPATIDE 5 MG/0.5ML ~~LOC~~ SOAJ: 5.0000 mg | SUBCUTANEOUS | 1 refills | Status: AC

## 2023-12-06 NOTE — Progress Notes (Signed)
 Endocrinology Follow Up Note       12/06/2023, 11:20 AM   Subjective:    Patient ID: Gabriel Schultz, male    DOB: 1951-01-03.  Gabriel Schultz is being seen in follow up after being seen in consultation for management of currently uncontrolled symptomatic diabetes requested by  Marvine Rush, MD.   Past Medical History:  Diagnosis Date   Arthritis    Coronary artery disease    Diabetes mellitus without complication (HCC)    GERD (gastroesophageal reflux disease)    Gout    High cholesterol    Hypertension    Myocardial infarction [facility])    2003   Renal insufficiency     Past Surgical History:  Procedure Laterality Date   BIOPSY  05/21/2019   Procedure: BIOPSY;  Surgeon: Shaaron Lamar HERO, MD;  Location: [facility];  Service: Endoscopy;;   CHOLECYSTECTOMY N/A 07/31/2019   Procedure: LAPAROSCOPIC CHOLECYSTECTOMY;  Surgeon: Kallie Manuelita BROCKS, MD;  Location: [facility] ORS;  Service: General;  Laterality: N/A;   COLONOSCOPY N/A 11/04/2018   Sigmoid and descending colon diverticulosis, six 5-11 mm polyps in descending and ascending colon, s/p clips. Tubular adenomas. 5 year surveillance   CORONARY STENT PLACEMENT     ESOPHAGOGASTRODUODENOSCOPY (EGD) WITH PROPOFOL  N/A 05/21/2019    mild incomplete Schatzki ring s/p dilation, duodenal web s/p dilation, s/p esophageal biopsy and gastric biopsy. +Barrett's, negative H.pylori. 3 year surveillance   HERNIA REPAIR Left    inguinal   MALONEY DILATION N/A 05/21/2019   Procedure: AGAPITO DILATION;  Surgeon: Shaaron Lamar HERO, MD;  Location: [facility];  Service: Endoscopy;  Laterality: N/A;   POLYPECTOMY  11/04/2018   Procedure: POLYPECTOMY;  Surgeon: Shaaron Lamar HERO, MD;  Location: [facility];  Service: Endoscopy;;   UMBILICAL HERNIA REPAIR  07/31/2019   Procedure: HERNIA REPAIR UMBILICAL ADULT;  Surgeon: Kallie Manuelita BROCKS, MD;  Location: [facility];  Service: General;;     Social History   Socioeconomic History   Marital status: Married    Spouse name: Not on file   Number of children: Not on file   Years of education: Not on file   Highest education level: Not on file  Occupational History   Not on file  Tobacco Use   Smoking status: Former    Current packs/day: 0.00    Average packs/day: 1 pack/day for 30.0 years (30.0 ttl pk-yrs)    Types: Cigarettes    Start date: 02/19/1958    Quit date: 02/20/1988    Years since quitting: 35.8   Smokeless tobacco: Former    Types: Chew    Quit date: 03/18/2016  Vaping Use   Vaping status: Never Used  Substance and Sexual Activity   Alcohol use: Not Currently    Comment: occasionally   Drug use: No   Sexual activity: Yes  Other Topics Concern   Not on file  Social History Narrative   Not on file   Social Drivers of Health   Financial Resource Strain: Not on file  Food Insecurity: Not on file  Transportation Needs: Not on file  Physical Activity: Not on file  Stress: Not on file  Social Connections:  Not on file    Family History  Problem Relation Age of Onset   Diabetes Mother    Diabetes Father     Outpatient Encounter Medications as of 12/06/2023  Medication Sig   allopurinol (ZYLOPRIM) 100 MG tablet Take 100 mg by mouth daily. In the evening   ALPRAZolam (XANAX) 0.5 MG tablet Take 0.5 mg by mouth 3 (three) times daily as needed.   amLODipine  (NORVASC ) 10 MG tablet Take 10 mg by mouth every evening.    aspirin EC 81 MG tablet Take 81 mg by mouth every evening.    Blood Glucose Monitoring Suppl (ONETOUCH VERIO FLEX SYSTEM) w/Device KIT by Does not apply route.   Blood Glucose Monitoring Suppl (ONETOUCH VERIO) w/Device KIT 1 each by Does not apply route in the morning, at noon, in the evening, and at bedtime.   fluticasone (FLONASE) 50 MCG/ACT nasal spray Place 1-2 sprays into both nostrils 2 (two) times daily as needed for allergies or rhinitis.    glucose blood (ONETOUCH VERIO) test  strip Use as instructed to check blood sugar four times daily.   glucose blood test strip 1 each by Other route as needed for other. Use as instructed   Lancets (ONETOUCH ULTRASOFT) lancets Use as instructed to check blood sugars four times daily.   losartan (COZAAR) 50 MG tablet Take 50 mg by mouth daily.   metoprolol  succinate (TOPROL -XL) 50 MG 24 hr tablet Take 50 mg by mouth daily.   oxyCODONE  (ROXICODONE ) 5 MG immediate release tablet Take 1 tablet (5 mg total) by mouth every 4 (four) hours as needed for severe pain or breakthrough pain.   pantoprazole  (PROTONIX ) 40 MG tablet Take 1 tablet (40 mg total) by mouth daily. (Patient taking differently: Take 40 mg by mouth daily as needed.)   REPATHA  SURECLICK 140 MG/ML SOAJ INJECT 140 MG INTO THE SKIN EVERY 14 DAYS   sodium bicarbonate 650 MG tablet Take 650 mg by mouth 2 (two) times daily.   sodium chloride  (OCEAN) 0.65 % SOLN nasal spray Place 2 sprays into both nostrils 3 (three) times daily as needed for congestion.   tirzepatide (MOUNJARO) 5 MG/0.5ML Pen Inject 5 mg into the skin once a week.   [DISCONTINUED] glipiZIDE  (GLUCOTROL  XL) 5 MG 24 hr tablet Take 1 tablet (5 mg total) by mouth daily with breakfast.   [DISCONTINUED] MOUNJARO 2.5 MG/0.5ML Pen Inject into the skin once a week.   [DISCONTINUED] sitaGLIPtin  (JANUVIA ) 50 MG tablet Take 1 tablet (50 mg total) by mouth daily. (Patient taking differently: Take 25 mg by mouth daily.)   [DISCONTINUED] fenofibrate 160 MG tablet Take 160 mg by mouth daily. (Patient not taking: Reported on 12/06/2023)   [DISCONTINUED] furosemide  (LASIX ) 20 MG tablet Take 1 tablet (20 mg total) by mouth daily. (Patient not taking: Reported on 12/06/2023)   No facility-administered encounter medications on file as of 12/06/2023.    ALLERGIES: Allergies  Allergen Reactions   Pravastatin  Other (See Comments)    Myalgia    VACCINATION STATUS: Immunization History  Administered Date(s) Administered   Tdap  12/04/2016    Diabetes He presents for his follow-up diabetic visit. He has type 2 diabetes mellitus. Onset time: diagnosed at approx age of 61. There are no hypoglycemic associated symptoms. Associated symptoms include blurred vision, fatigue and polyuria. There are no hypoglycemic complications. Symptoms are stable. 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 Risk factors for coronary artery disease include diabetes mellitus, dyslipidemia, family history,  male sex, obesity, hypertension and sedentary lifestyle. Current diabetic treatment includes oral agent (monotherapy). He is compliant with treatment most of the time. His weight is decreasing steadily. He is following a generally healthy diet. When asked about meal planning, he reported none. He has not had a previous visit with a dietitian. He participates in exercise intermittently. (He presents today after long absence with no meter or logs to review. He notes his morning sugars are the highest of the day sometimes up to 170 but the afternoons are more in the 120s.  His POCT A1c today is 6.4%, improving from last A1c of 8.4%.  His PCP started him on Mounjaro in July, has been on the 2.5 mg dose.  He denies any unpleasant side effects from this medication.  He denies any hypoglycemia.) An ACE inhibitor/angiotensin II receptor blocker is being taken. He does not see a podiatrist.Eye exam is current.     Review of systems  Constitutional: + decreasing body weight-started on Mounjaro by PCP in July,  current Body mass index is 29.79 kg/m. , no fatigue, no subjective hyperthermia, no subjective hypothermia Eyes: no blurry vision, no xerophthalmia ENT: no sore throat, no nodules palpated in throat, no dysphagia/odynophagia, no hoarseness Cardiovascular: no chest pain, no shortness of breath, no palpitations, no leg swelling Respiratory: no cough, no shortness of breath Gastrointestinal: no  nausea/vomiting/diarrhea Musculoskeletal: no muscle/joint aches Skin: no rashes, no hyperemia Neurological: no tremors, no numbness, no tingling, no dizziness Psychiatric: no depression, no anxiety  Objective:     BP 136/80 (BP Location: Left Arm, Patient Position: Sitting, Cuff Size: Large)   Pulse 60   Ht 6' 1 (1.854 m)   Wt 225 lb 12.8 oz (102.4 kg)   BMI 29.79 kg/m   Wt Readings from Last 3 Encounters:  12/06/23 225 lb 12.8 oz (102.4 kg)  12/05/23 228 lb 6.4 oz (103.6 kg)  03/15/23 251 lb 12.8 oz (114.2 kg)     BP Readings from Last 3 Encounters:  12/06/23 136/80  12/05/23 132/72  03/15/23 130/64      Physical Exam- Limited  Constitutional:  Body mass index is 29.79 kg/m. , not in acute distress, normal state of mind Eyes:  EOMI, no exophthalmos Musculoskeletal: no gross deformities, strength intact in all four extremities, no gross restriction of joint movements Skin:  no rashes, no hyperemia Neurological: no tremor with outstretched hands    CMP ( most recent) CMP     Component Value Date/Time   NA 142 03/25/2023 0844   K 5.2 03/25/2023 0844   CL 106 03/25/2023 0844   CO2 19 (L) 03/25/2023 0844   GLUCOSE 169 (H) 03/25/2023 0844   GLUCOSE 179 (H) 08/06/2019 0502   BUN 41 (H) 03/25/2023 0844   CREATININE 3.25 (H) 03/25/2023 0844   CALCIUM  9.8 03/25/2023 0844   PROT 7.1 08/03/2019 0604   ALBUMIN 2.8 (L) 08/06/2019 0502   AST 26 08/03/2019 0604   ALT 39 08/03/2019 0604   ALKPHOS 51 08/03/2019 0604   BILITOT 0.7 08/03/2019 0604   EGFR 19 (L) 03/25/2023 0844   GFRNONAA 33 (L) 08/06/2019 0502     Diabetic Labs (most recent): Lab Results  Component Value Date   HGBA1C 6.4 (A) 12/06/2023   HGBA1C 7.4 (H) 07/31/2019   HGBA1C (H) 06/05/2008    6.4 (NOTE) The ADA recommends the following therapeutic goal for glycemic control related to Hgb A1c measurement: Goal of therapy: <6.5 Hgb A1c  Reference: American Diabetes Association: Clinical Practice  Recommendations 2010, Diabetes Care, 2010, 33: (Suppl  1).     Lipid Panel ( most recent) Lipid Panel  No results found for: CHOL, TRIG, HDL, CHOLHDL, VLDL, LDLCALC, LDLDIRECT, LABVLDL    No results found for: TSH, FREET4         Assessment & Plan:   1) Type 2 diabetes mellitus with stage 4 chronic kidney disease, without long-term current use of insulin  (HCC)  He presents today after long absence with no meter or logs to review. He notes his morning sugars are the highest of the day sometimes up to 170 but the afternoons are more in the 120s.  His POCT A1c today is 6.4%, improving from last A1c of 8.4%.  His PCP started him on Mounjaro in July, has been on the 2.5 mg dose.  He denies any unpleasant side effects from this medication.  He denies any hypoglycemia.  - Gabriel Schultz has currently uncontrolled symptomatic type 2 DM since 73 years of age.   -Recent labs reviewed.  - I had a long discussion with him about the progressive nature of diabetes and the pathology behind its complications. -his diabetes is complicated by CAD with MI, CKD stage 4, and he remains at a high risk for more acute and chronic complications which include CAD, CVA, CKD, retinopathy, and neuropathy. These are all discussed in detail with him.  The following Lifestyle Medicine recommendations according to American College of Lifestyle Medicine [facility]) were discussed and offered to patient and he agrees to start the journey:  A. Whole Foods, Plant-based plate comprising of fruits and vegetables, plant-based proteins, whole-grain carbohydrates was discussed in detail with the patient.   A list for source of those nutrients were also provided to the patient.  Patient will use only water  or unsweetened tea for hydration. B.  The need to stay away from risky substances including alcohol, smoking; obtaining 7 to 9 hours of restorative sleep, at least 150 minutes of moderate intensity exercise  weekly, the importance of healthy social connections,  and stress reduction techniques were discussed. C.  A full color page of  Calorie density of various food groups per pound showing examples of each food groups was provided to the patient.  - Nutritional counseling repeated/built upon at each appointment.  - The patient admits there is a room for improvement in their diet and drink choices. -  Suggestion is made for the patient to avoid simple carbohydrates from their diet including Cakes, Sweet Desserts / Pastries, Ice Cream, Soda (diet and regular), Sweet Tea, Candies, Chips, Cookies, Sweet Pastries, Store Bought Juices, Alcohol in Excess of 1-2 drinks a day, Artificial Sweeteners, Coffee Creamer, and Sugar-free Products. This will help patient to have stable blood glucose profile and potentially avoid unintended weight gain.   - I encouraged the patient to switch to unprocessed or minimally processed complex starch and increased protein intake (animal or plant source), fruits, and vegetables.   - Patient is advised to stick to a routine mealtimes to eat 3 meals a day and avoid unnecessary snacks (to snack only to correct hypoglycemia).  - he will be scheduled with Santana Duke, RDN, CDE for diabetes education.  - I have approached him with the following individualized plan to manage his diabetes and patient agrees:   -Will increase his Mounjaro to 5 mg SQ weekly and stop the Januva (duplicate incretin therapy) and stop Glipizide  to avoid potential for hypoglycemia (he is a [employer]).    -he is  encouraged to continue monitoring fasting glucose 1-2 times weekly, and reach out if glucose is above 200 or less than 70 for 3 tests in a row.  - Adjustment parameters are given to him for hypo and hyperglycemia in writing.  - he is not a candidate for Metformin  due to concurrent renal insufficiency.  - Specific targets for  A1c; LDL, HDL, and Triglycerides were discussed with the  patient.  2) Blood Pressure /Hypertension:  his blood pressure is controlled to target.   he is advised to continue his current medications as prescribed by his PCP.  3) Lipids/Hyperlipidemia:    Review of his recent lipid panel from 08/17/22 showed uncontrolled LDL at 123 and elevated triglycerides of 384 .  he is advised to continue Fenofibrate 160 mg po daily and Pravastatin  40 mg daily at bedtime.  Side effects and precautions discussed with him.  4)  Weight/Diet:  his Body mass index is 29.79 kg/m.  -  clearly complicating his diabetes care.   he is a candidate for weight loss. I discussed with him the fact that loss of 5 - 10% of his  current body weight will have the most impact on his diabetes management.  Exercise, and detailed carbohydrates information provided  -  detailed on discharge instructions.  5) Chronic Care/Health Maintenance: -he is on ACEI/ARB and Statin medications and is encouraged to initiate and continue to follow up with Ophthalmology, Dentist, Podiatrist at least yearly or according to recommendations, and advised to stay away from smoking. I have recommended yearly flu vaccine and pneumonia vaccine at least every 5 years; moderate intensity exercise for up to 150 minutes weekly; and sleep for at least 7 hours a day.  - he is advised to maintain close follow up with Marvine Rush, MD for primary care needs, as well as his other providers for optimal and coordinated care.     I spent  40  minutes in the care of the patient today including review of labs from CMP, Lipids, Thyroid  Function, Hematology (current and previous including abstractions from other facilities); face-to-face time discussing  his blood glucose readings/logs, discussing hypoglycemia and hyperglycemia episodes and symptoms, medications doses, his options of short and long term treatment based on the latest standards of care / guidelines;  discussion about incorporating lifestyle medicine;  and  documenting the encounter. Risk reduction counseling performed per USPSTF guidelines to reduce obesity and cardiovascular risk factors.     Please refer to Patient Instructions for Blood Glucose Monitoring and Insulin /Medications Dosing Guide  in media tab for additional information. Please  also refer to  Patient Self Inventory in the Media  tab for reviewed elements of pertinent patient history.  Rodgers JONELLE Ada participated in the discussions, expressed understanding, and voiced agreement with the above plans.  All questions were answered to his satisfaction. he is encouraged to contact clinic should he have any questions or concerns prior to his return visit.     Follow up plan: - Return in about 4 months (around 04/07/2024) for Diabetes F/U with A1c in office, No previsit labs, Bring meter and logs.    Benton Rio, [facility] [address] [place], [place] [address] Phone: [phone] Fax: [phone]  12/06/2023, 11:20 AM

## 2023-12-11 LAB — LIPID PANEL
Chol/HDL Ratio: 3.4 ratio (ref 0.0–5.0)
Cholesterol, Total: 131 mg/dL (ref 100–199)
HDL: 39 mg/dL — ABNORMAL LOW (ref >39)
LDL Chol Calc (NIH): 56 mg/dL (ref 0–99)
Triglycerides: 223 mg/dL — ABNORMAL HIGH (ref 0–149)
VLDL Cholesterol Cal: 36 mg/dL (ref 5–40)

## 2023-12-17 ENCOUNTER — Other Ambulatory Visit: Payer: Self-pay

## 2023-12-17 ENCOUNTER — Emergency Department (HOSPITAL_COMMUNITY)
Admission: EM | Admit: 2023-12-17 | Discharge: 2023-12-17 | Disposition: A | Discharge status: Home / Self Care | Attending: Emergency Medicine | Admitting: Emergency Medicine

## 2023-12-17 ENCOUNTER — Encounter (HOSPITAL_COMMUNITY): Payer: Self-pay

## 2023-12-17 DIAGNOSIS — K644 Residual hemorrhoidal skin tags: Secondary | ICD-10-CM | POA: Diagnosis not present

## 2023-12-17 DIAGNOSIS — K921 Melena: Secondary | ICD-10-CM | POA: Diagnosis present

## 2023-12-17 DIAGNOSIS — K625 Hemorrhage of anus and rectum: Secondary | ICD-10-CM

## 2023-12-17 DIAGNOSIS — K649 Unspecified hemorrhoids: Secondary | ICD-10-CM

## 2023-12-17 LAB — COMPREHENSIVE METABOLIC PANEL WITH GFR
ALT: 47 U/L — ABNORMAL HIGH (ref 0–44)
AST: 37 U/L (ref 15–41)
Albumin: 4.6 g/dL (ref 3.5–5.0)
Alkaline Phosphatase: 37 U/L — ABNORMAL LOW (ref 38–126)
Anion gap: 14 (ref 5–15)
BUN: 32 mg/dL — ABNORMAL HIGH (ref 8–23)
CO2: 19 mmol/L — ABNORMAL LOW (ref 22–32)
Calcium: 9.4 mg/dL (ref 8.9–10.3)
Chloride: 106 mmol/L (ref 98–111)
Creatinine, Ser: 2.82 mg/dL — ABNORMAL HIGH (ref 0.61–1.24)
GFR, Estimated: 23 mL/min — ABNORMAL LOW (ref >60)
Glucose, Bld: 146 mg/dL — ABNORMAL HIGH (ref 70–99)
Potassium: 4.6 mmol/L (ref 3.5–5.1)
Sodium: 139 mmol/L (ref 135–145)
Total Bilirubin: 0.4 mg/dL (ref 0.0–1.2)
Total Protein: 7.1 g/dL (ref 6.5–8.1)

## 2023-12-17 LAB — URINALYSIS, ROUTINE W REFLEX MICROSCOPIC
Bacteria, UA: NONE SEEN
Bilirubin Urine: NEGATIVE
Glucose, UA: >=500 mg/dL — AB
Hgb urine dipstick: NEGATIVE
Ketones, ur: NEGATIVE mg/dL
Leukocytes,Ua: NEGATIVE
Nitrite: NEGATIVE
Protein, ur: NEGATIVE mg/dL
Specific Gravity, Urine: 1.013 (ref 1.005–1.030)
pH: 6 (ref 5.0–8.0)

## 2023-12-17 LAB — CBC WITH DIFFERENTIAL/PLATELET
Abs Immature Granulocytes: 0.01 K/uL (ref 0.00–0.07)
Basophils Absolute: 0.1 K/uL (ref 0.0–0.1)
Basophils Relative: 1 %
Eosinophils Absolute: 0.2 K/uL (ref 0.0–0.5)
Eosinophils Relative: 4 %
HCT: 43 % (ref 39.0–52.0)
Hemoglobin: 14.4 g/dL (ref 13.0–17.0)
Immature Granulocytes: 0 %
Lymphocytes Relative: 27 %
Lymphs Abs: 1.3 K/uL (ref 0.7–4.0)
MCH: 30.9 pg (ref 26.0–34.0)
MCHC: 33.5 g/dL (ref 30.0–36.0)
MCV: 92.3 fL (ref 80.0–100.0)
Monocytes Absolute: 0.5 K/uL (ref 0.1–1.0)
Monocytes Relative: 10 %
Neutro Abs: 2.9 K/uL (ref 1.7–7.7)
Neutrophils Relative %: 58 %
Platelets: 225 K/uL (ref 150–400)
RBC: 4.66 MIL/uL (ref 4.22–5.81)
RDW: 14.6 % (ref 11.5–15.5)
WBC: 4.9 K/uL (ref 4.0–10.5)
nRBC: 0 % (ref 0.0–0.2)

## 2023-12-17 LAB — PROTIME-INR
INR: 1 (ref 0.8–1.2)
Prothrombin Time: 13.7 s (ref 11.4–15.2)

## 2023-12-17 LAB — TYPE AND SCREEN
ABO/RH(D): A NEG
Antibody Screen: NEGATIVE

## 2023-12-17 NOTE — Discharge Instructions (Signed)
 Please follow-up closely with gastroenterology and your primary care doctor on an outpatient basis.  Return to emergency department immediately for any new or worsening symptoms.

## 2023-12-17 NOTE — ED Triage Notes (Signed)
 Pt arrived via POV c/o hematochezia for past 6 days. Pt reports prior to this, he was constipated and took Metamucil and laxatives and began having normal stools. Pt reports now he is noticing more bright red blood in is stool with blood clots.

## 2023-12-17 NOTE — ED Provider Notes (Signed)
 [place] EMERGENCY DEPARTMENT AT [facility] Provider Note   CSN: [id number] Arrival date & time: 12/17/23  [id number]     Patient presents with: Hematochezia   Gabriel Schultz is a 73 y.o. male.   Patient is a 73 year old male who presents emergency department chief complaint of hematochezia which has been ongoing for approximate the past 6 days.  He notes that he has had blood in his stools with each bowel movement.  He does note that prior to that he was experiencing constipation and was taking laxatives.  Patient notes that he has had no associated abdominal pain, nausea, vomiting, diarrhea.  He denies any associated dizziness, lightheadedness, syncope.  He has had no chest pain or shortness of breath.        Prior to Admission medications   Medication Sig Start Date End Date Taking? Authorizing Provider  allopurinol (ZYLOPRIM) 100 MG tablet Take 100 mg by mouth daily. In the evening    [provider]  ALPRAZolam (XANAX) 0.5 MG tablet Take 0.5 mg by mouth 3 (three) times daily as needed. 12/20/21   [provider]  amLODipine  (NORVASC ) 10 MG tablet Take 10 mg by mouth every evening.     [provider]  aspirin EC 81 MG tablet Take 81 mg by mouth every evening.     [provider]  Blood Glucose Monitoring Suppl (ONETOUCH VERIO FLEX SYSTEM) w/Device KIT by Does not apply route.    [provider]  Blood Glucose Monitoring Suppl (ONETOUCH VERIO) w/Device KIT 1 each by Does not apply route in the morning, at noon, in the evening, and at bedtime. 09/26/22   Therisa Benton PARAS, NP  fluticasone (FLONASE) 50 MCG/ACT nasal spray Place 1-2 sprays into both nostrils 2 (two) times daily as needed for allergies or rhinitis.     [provider]  glucose blood (ONETOUCH VERIO) test strip Use as instructed to check blood sugar four times daily. 09/24/22   Therisa Benton PARAS, NP  glucose blood test strip 1 each by Other route as needed for  other. Use as instructed    [provider]  Lancets (ONETOUCH ULTRASOFT) lancets Use as instructed to check blood sugars four times daily. 09/24/22   Therisa Benton PARAS, NP  losartan (COZAAR) 50 MG tablet Take 50 mg by mouth daily. 10/06/21   [provider]  metoprolol  succinate (TOPROL -XL) 50 MG 24 hr tablet Take 50 mg by mouth daily. 03/07/21   [provider]  oxyCODONE  (ROXICODONE ) 5 MG immediate release tablet Take 1 tablet (5 mg total) by mouth every 4 (four) hours as needed for severe pain or breakthrough pain. 10/24/22   Kallie Manuelita BROCKS, MD  pantoprazole  (PROTONIX ) 40 MG tablet Take 1 tablet (40 mg total) by mouth daily. Patient taking differently: Take 40 mg by mouth daily as needed. 10/16/21   Kennedy Charmaine CROME, NP  REPATHA  SURECLICK 140 MG/ML SOAJ INJECT 140 MG INTO THE SKIN EVERY 14 DAYS 11/26/23   Alvan Dorn FALCON, MD  sodium bicarbonate 650 MG tablet Take 650 mg by mouth 2 (two) times daily. 02/20/22   [provider]  sodium chloride  (OCEAN) 0.65 % SOLN nasal spray Place 2 sprays into both nostrils 3 (three) times daily as needed for congestion.    [provider]  tirzepatide CLOYDE) 5 MG/0.5ML Pen Inject 5 mg into the skin once a week. 12/06/23   Therisa Benton PARAS, NP    Allergies: Pravastatin     Review  of Systems  Gastrointestinal:  Positive for blood in stool.  All other systems reviewed and are negative.   Updated Vital Signs BP (!) 156/79 (BP Location: Right Arm)   Pulse 65   Temp 97.8 F (36.6 C) (Oral)   Resp 16   Ht 6' 1 (1.854 m)   Wt 102.4 kg   SpO2 98%   BMI 29.79 kg/m   Physical Exam Vitals and nursing note reviewed. Exam conducted with a chaperone present.  Constitutional:      General: He is not in acute distress.    Appearance: Normal appearance. He is not ill-appearing.  HENT:     Head: Normocephalic and atraumatic.     Nose: Nose normal.     Mouth/Throat:     Mouth: Mucous membranes are moist.   Eyes:     Extraocular Movements: Extraocular movements intact.     Conjunctiva/sclera: Conjunctivae normal.     Pupils: Pupils are equal, round, and reactive to light.  Cardiovascular:     Rate and Rhythm: Normal rate and regular rhythm.     Pulses: Normal pulses.     Heart sounds: Normal heart sounds. No murmur heard.    No gallop.  Pulmonary:     Effort: Pulmonary effort is normal. No respiratory distress.     Breath sounds: Normal breath sounds. No stridor. No wheezing, rhonchi or rales.  Abdominal:     General: Abdomen is flat. Bowel sounds are normal. There is no distension.     Palpations: Abdomen is soft.     Tenderness: There is no abdominal tenderness. There is no guarding.  Genitourinary:    Rectum: Normal. Guaiac result positive.     Comments: Small external nonbleeding hemorrhoids Musculoskeletal:        General: Normal range of motion.     Cervical back: Normal range of motion and neck supple.  Skin:    General: Skin is warm and dry.  Neurological:     General: No focal deficit present.     Mental Status: He is alert and oriented to person, place, and time. Mental status is at baseline.  Psychiatric:        Mood and Affect: Mood normal.        Behavior: Behavior normal.        Thought Content: Thought content normal.        Judgment: Judgment normal.     (all labs ordered are listed, but only abnormal results are displayed) Labs Reviewed  CBC WITH DIFFERENTIAL/PLATELET  COMPREHENSIVE METABOLIC PANEL WITH GFR  PROTIME-INR  URINALYSIS, ROUTINE W REFLEX MICROSCOPIC  POC OCCULT BLOOD, ED  TYPE AND SCREEN    EKG: None  Radiology: No results found.   Procedures   Medications Ordered in the ED - No data to display                                  Medical Decision Making Amount and/or Complexity of Data Reviewed Labs: ordered.   This patient presents to the ED for concern of rectal bleeding differential diagnosis includes diverticular bleed,  hemorrhoids, GERD, peptic ulcer, diverticulitis, anemia    Additional history obtained:  Additional history obtained from medical records External records from outside source obtained and reviewed including medical records   Lab Tests:  I Ordered, and personally interpreted labs.  The pertinent results include: No leukocytosis, no anemia, creatinine at baseline, unremarkable liver function and  electrolytes, normal urinalysis    Problem List / ED Course:  Patient is doing very well at this time and is stable for discharge home.  Patient was Hemoccult positive on exam but had no bright red blood per rectum and no melena.  Patient has stable vital signs at this point with no hypotension and no tachycardia.  He has no associated anemia at this point and hemoglobin and hematocrit are actually improved from baseline.  Do not suspect that admission is warranted at this time.  Abdominal exam is benign with no focal tenderness throughout do not suspect an acute intra-abdominal surgical process.  Discussed the need for close follow-up with his gastroenterologist on outpatient basis for further evaluation and colonoscopy.  Strict turn precautions were provided as well for any new or worsening symptoms.  Patient voiced understanding and had no additional questions.   Social Determinants of Health:  None        Final diagnoses:  None    ED Discharge Orders     None          Daralene Lonni JONETTA DEVONNA 12/17/23 1243    Melvenia Motto, MD 12/17/23 [phone]

## 2023-12-31 ENCOUNTER — Encounter: Payer: Self-pay | Admitting: *Deleted

## 2023-12-31 ENCOUNTER — Other Ambulatory Visit: Payer: Self-pay | Admitting: *Deleted

## 2023-12-31 ENCOUNTER — Encounter: Payer: Self-pay | Admitting: Gastroenterology

## 2023-12-31 ENCOUNTER — Ambulatory Visit: Admitting: Gastroenterology

## 2023-12-31 VITALS — BP 138/76 | HR 76 | Temp 97.9°F | Ht 73.0 in | Wt 225.4 lb

## 2023-12-31 DIAGNOSIS — R7401 Elevation of levels of liver transaminase levels: Secondary | ICD-10-CM

## 2023-12-31 DIAGNOSIS — K219 Gastro-esophageal reflux disease without esophagitis: Secondary | ICD-10-CM | POA: Diagnosis not present

## 2023-12-31 DIAGNOSIS — K921 Melena: Secondary | ICD-10-CM | POA: Diagnosis not present

## 2023-12-31 DIAGNOSIS — Z860100 Personal history of colon polyps, unspecified: Secondary | ICD-10-CM | POA: Diagnosis not present

## 2023-12-31 DIAGNOSIS — K644 Residual hemorrhoidal skin tags: Secondary | ICD-10-CM | POA: Diagnosis not present

## 2023-12-31 DIAGNOSIS — K2270 Barrett's esophagus without dysplasia: Secondary | ICD-10-CM

## 2023-12-31 MED ORDER — PEG 3350-KCL-NA BICARB-NACL 420 G PO SOLR: 4000.0000 mL | Freq: Once | ORAL | 0 refills | Status: AC

## 2023-12-31 NOTE — Progress Notes (Signed)
 GI Office Note    Referring Provider: Marvine Rush, MD Primary Care Physician:  Marvine Rush, MD Primary Gastroenterologist: Lamar HERO.Rourk, MD  Date:  12/31/2023  ID:  Gabriel Schultz, DOB [birth year], MRN [id number]   Chief Complaint   Chief Complaint  Patient presents with   Rectal Bleeding    Had some rectal bleeding. Went to ED was told it was a internal hemorrhoid. Has not seen any bleeding since     History of Present Illness  Gabriel Schultz is a 73 y.o. male with a history of CAD with MI in 2003, HTN, HLD, GERD, diabetes, CKD, and gout presenting today with complaint of hematochezia.  Colonoscopy September 2020: - Sigmoid and descending diverticulosis - Six 5-11 mm adenomatous polyps in the descending and ascending colon with clip placement - Repeat 2025 recommended  EGD April 2021: - Incomplete Schatzki's ring s/p dilation - 1 cm island of salmon-colored mucosa with biopsy confirmed Barrett's - Multiple benign gastric polyps - Duodenal web s/p dilation - Recommend repeat in 2024.  OV 08/25/2019. Follow up of GERD and Dysphagia and recently diagnosed Barrett's. Denied any issues, had recent cholecystectomy  and was taking protonix  40 mg BID. Advised to go to daily PPI in 1 month but may increase to twice daily if worsening GERD symptoms.   Last office visit in August 2023.  Started having constipation a couple weeks prior, had been following a new diet for his blood sugars.  States he will have the urge to go but when he gets there is not usually able to go, usually have been going more frequently and looser stools given gallbladder removal.  Sometimes having Bristol 1 stools or like a pencil.  Other times will have large amounts of stool.  Denied any abdominal pain, melena, hematochezia.  He has had intentional weight loss.  Had recently started taking Metamucil and seeing some improvement and drinking 2 actually bottles of water  daily.  Having sinus drainage and having  to clear his throat frequently and taking Mucinex  as needed.  Denied any dysphagia or belching, chest pain, or shortness of breath.  Recommended Benefiber supplementation 2-3 teaspoons daily and continue pantoprazole  40 mg but reduce to once daily.  Advise follow-up in 1 year or sooner if constipation not relieved.  Multiple letters including colonoscopy questionnaire sent to patient in 2024 and earlier this year in 2025 and he has not called the office to schedule procedures.  ED visit 12/17/2023 with reports of hematochezia for 6 days.  Having blood with each bowel movement and prior to that noted some constipation and was taking laxatives.  He denied any abdominal pain, nausea, vomiting, diarrhea, dizziness, lightheadedness, syncope, chest pain, or shortness of breath.  EDP exam notes small external nonbleeding hemorrhoids and guaiac positive stool.  No overt bleeding was identified on examination.  No anemia with H&H stable  Labs 12/17/2023: Hemoglobin 14.4 (previously 11.1), creatinine 2.82, ALT 47 (H), AST 37, normal T. bili.  INR 1.  UA with glycosuria, negative for UTI.  Today: Discussed the use of AI scribe software for clinical note transcription with the patient, who gave verbal consent to proceed.  He experienced rectal bleeding following difficulty with bowel movements, requiring straining. The bleeding was characterized by dark red drops in the toilet after bowel movements and persisted for a few days before he sought medical attention. Blood present on toilet tissue as well. He continued taking Metamucil once daily, which improved his symptoms, and the bleeding resolved  and has had nothing within a week and a half.   His bowel movements were initially hard, requiring straining, but after starting Metamucil, they became softer. He describes his stools as 'water  and pellets' initially or 'soft, formed bowel movements' now with some more mushy stools.  No abdominal pain, nausea, vomiting, or  changes in appetite. He has a history of diverticulosis and colon polyps. Denies chest pain, dyspnea, lightheadedness, syncope.   He has a history of Barrett's esophagus and had an upper endoscopy in 2021. He recently changed his diet to manage blood sugar, resulting in some weight loss. He is on Mounjaro for diabetes management, taken weekly, and has adjusted his diet to include more salads and fewer sugary foods. His blood sugar levels are generally well-controlled, with morning readings around 130-140 mg/dL and lower during the day.  No recent alcohol or tobacco use. A recent ultrasound indicated a new hernia in the right groin area, which started a couple of months ago but denies significant pain currently.      Wt Readings from Last 6 Encounters:  12/31/23 225 lb 6.4 oz (102.2 kg)  12/17/23 225 lb 12.8 oz (102.4 kg)  12/06/23 225 lb 12.8 oz (102.4 kg)  12/05/23 228 lb 6.4 oz (103.6 kg)  03/15/23 251 lb 12.8 oz (114.2 kg)  02/11/23 246 lb 6.4 oz (111.8 kg)    Body mass index is 29.74 kg/m.   Current Outpatient Medications  Medication Sig Dispense Refill   allopurinol (ZYLOPRIM) 100 MG tablet Take 100 mg by mouth daily. In the evening     ALPRAZolam (XANAX) 0.5 MG tablet Take 0.5 mg by mouth 3 (three) times daily as needed.     amLODipine  (NORVASC ) 10 MG tablet Take 10 mg by mouth every evening.      aspirin EC 81 MG tablet Take 81 mg by mouth every evening.      Blood Glucose Monitoring Suppl (ONETOUCH VERIO FLEX SYSTEM) w/Device KIT by Does not apply route.     Blood Glucose Monitoring Suppl (ONETOUCH VERIO) w/Device KIT 1 each by Does not apply route in the morning, at noon, in the evening, and at bedtime. 1 kit 0   fluticasone (FLONASE) 50 MCG/ACT nasal spray Place 1-2 sprays into both nostrils 2 (two) times daily as needed for allergies or rhinitis.      glucose blood (ONETOUCH VERIO) test strip Use as instructed to check blood sugar four times daily. 300 each 3   glucose blood  test strip 1 each by Other route as needed for other. Use as instructed     Lancets (ONETOUCH ULTRASOFT) lancets Use as instructed to check blood sugars four times daily. 100 each 12   losartan (COZAAR) 50 MG tablet Take 50 mg by mouth daily.     metoprolol  succinate (TOPROL -XL) 50 MG 24 hr tablet Take 50 mg by mouth daily.     REPATHA  SURECLICK 140 MG/ML SOAJ INJECT 140 MG INTO THE SKIN EVERY 14 DAYS 2 mL 5   sodium bicarbonate 650 MG tablet Take 650 mg by mouth 2 (two) times daily.     sodium chloride  (OCEAN) 0.65 % SOLN nasal spray Place 2 sprays into both nostrils 3 (three) times daily as needed for congestion.     tirzepatide (MOUNJARO) 5 MG/0.5ML Pen Inject 5 mg into the skin once a week. 6 mL 1   oxyCODONE  (ROXICODONE ) 5 MG immediate release tablet Take 1 tablet (5 mg total) by mouth every 4 (four) hours as needed  for severe pain or breakthrough pain. (Patient not taking: Reported on 12/31/2023) 4 tablet 0   pantoprazole  (PROTONIX ) 40 MG tablet Take 1 tablet (40 mg total) by mouth daily. (Patient taking differently: Take 40 mg by mouth daily as needed.) 30 tablet 11   No current facility-administered medications for this visit.    Past Medical History:  Diagnosis Date   Arthritis    Coronary artery disease    Diabetes mellitus without complication (HCC)    GERD (gastroesophageal reflux disease)    Gout    High cholesterol    Hypertension    Myocardial infarction [facility])    2003   Renal insufficiency     Past Surgical History:  Procedure Laterality Date   BIOPSY  05/21/2019   Procedure: BIOPSY;  Surgeon: Shaaron Lamar HERO, MD;  Location: [facility];  Service: Endoscopy;;   CHOLECYSTECTOMY N/A 07/31/2019   Procedure: LAPAROSCOPIC CHOLECYSTECTOMY;  Surgeon: Kallie Manuelita BROCKS, MD;  Location: [facility];  Service: General;  Laterality: N/A;   COLONOSCOPY N/A 11/04/2018   Sigmoid and descending colon diverticulosis, six 5-11 mm polyps in descending and ascending colon, s/p clips. Tubular  adenomas. 5 year surveillance   CORONARY STENT PLACEMENT     ESOPHAGOGASTRODUODENOSCOPY (EGD) WITH PROPOFOL  N/A 05/21/2019    mild incomplete Schatzki ring s/p dilation, duodenal web s/p dilation, s/p esophageal biopsy and gastric biopsy. +Barrett's, negative H.pylori. 3 year surveillance   HERNIA REPAIR Left    inguinal   MALONEY DILATION N/A 05/21/2019   Procedure: AGAPITO DILATION;  Surgeon: Shaaron Lamar HERO, MD;  Location: [facility];  Service: Endoscopy;  Laterality: N/A;   POLYPECTOMY  11/04/2018   Procedure: POLYPECTOMY;  Surgeon: Shaaron Lamar HERO, MD;  Location: [facility];  Service: Endoscopy;;   UMBILICAL HERNIA REPAIR  07/31/2019   Procedure: HERNIA REPAIR UMBILICAL ADULT;  Surgeon: Kallie Manuelita BROCKS, MD;  Location: [facility];  Service: General;;    Family History  Problem Relation Age of Onset   Diabetes Mother    Diabetes Father     Allergies as of 12/31/2023 - Review Complete 12/31/2023  Allergen Reaction Noted   Pravastatin  Other (See Comments) 11/19/2022    Social History   Socioeconomic History   Marital status: Married    Spouse name: Not on file   Number of children: Not on file   Years of education: Not on file   Highest education level: Not on file  Occupational History   Not on file  Tobacco Use   Smoking status: Former    Current packs/day: 0.00    Average packs/day: 1 pack/day for 30.0 years (30.0 ttl pk-yrs)    Types: Cigarettes    Start date: 02/19/1958    Quit date: 02/20/1988    Years since quitting: 35.8   Smokeless tobacco: Former    Types: Chew    Quit date: 03/18/2016  Vaping Use   Vaping status: Never Used  Substance and Sexual Activity   Alcohol use: Not Currently    Comment: occasionally   Drug use: No   Sexual activity: Yes  Other Topics Concern   Not on file  Social History Narrative   Not on file   Social Drivers of Health   Financial Resource Strain: Not on file  Food Insecurity: Not on file  Transportation Needs: Not on file   Physical Activity: Not on file  Stress: Not on file  Social Connections: Not on file    Review of Systems   Gen:  Denies fever, chills, anorexia. Denies fatigue, weakness, weight loss.  CV: Denies chest pain, palpitations, syncope, peripheral edema, and claudication. Resp: Denies dyspnea at rest, cough, wheezing, coughing up blood, and pleurisy. GI: See HPI Derm: Denies rash, itching, dry skin Psych: Denies depression, anxiety, memory loss, confusion. No homicidal or suicidal ideation.  Heme: Denies bruising, bleeding, and enlarged lymph nodes.  Physical Exam   BP 138/76 (BP Location: Right Arm, Patient Position: Sitting, Cuff Size: Large)   Pulse 76   Temp 97.9 F (36.6 C) (Temporal)   Ht 6' 1 (1.854 m)   Wt 225 lb 6.4 oz (102.2 kg)   BMI 29.74 kg/m   General:   Alert and oriented. No distress noted. Pleasant and cooperative.  Head:  Normocephalic and atraumatic. Eyes:  Conjuctiva clear without scleral icterus. Mouth:  Oral mucosa pink and moist. Good dentition. No lesions. Lungs:  Clear to auscultation bilaterally. No wheezes, rales, or rhonchi. No distress.  Heart:  S1, S2 present without murmurs appreciated.  Abdomen:  +BS, soft, non-tender and non-distended. No rebound or guarding. No HSM or masses noted. Rectal: deferred Msk:  Symmetrical without gross deformities. Normal posture. Extremities:  Without edema. Neurologic:  Alert and  oriented x4 Psych:  Alert and cooperative. Normal mood and affect.  Assessment & Plan  Gabriel Schultz is a 73 y.o. male presenting today with for further evaluation of hematochezia  Rectal bleeding and constipation Intermittent rectal bleeding with constipation, likely due to hemorrhoids exacerbated by hard stools and straining. Differential includes diverticulosis and colon polyps, malignancy possible but less likely. No abdominal pain, nausea, vomiting, or reflux. Bright red blood on wiping, dark red drops in toilet. Could have been  mild mucosal tear as well given straining. ED visit for this with rectal exam reporting external hemorrhoids although patient reports he was told possible internal hemorrhoids. No recent use of hemorrhoid cream for this episode. Constipation improved with Metamucil. No significant changes in bowel habits prior to this episode. Mounjaro may contribute to constipation. - Continue Metamucil daily for bowel regulation. - Add Miralax if constipation worsens or stools become hard. - Scheduled colonoscopy to evaluate rectal bleeding and rule out other causes. - Educated on avoiding spicy foods that may exacerbate symptoms. - Consider using hemorrhoid cream if rectal bleeding reoccurs.   Surveillance for personal history of colon polyps Due for colonoscopy as last performed in 2020 with multiple tubular adenomas. Colonoscopy recommended to evaluate rectal bleeding and ensure no new polyps have developed. - Scheduled colonoscopy as below.   Barrett's esophagus surveillance and GERD Barrett's esophagus with last upper endoscopy in 2021. No current symptoms of GERD or reflux. Upper endoscopy recommended to monitor for progression and rule out esophageal cancer. Currently using pantoprazole  as needed and not daily. No dysphagia, nausea, or vomiting. No ear;u satiety. - Scheduled upper endoscopy after colonoscopy. Discussed performing at time of colonoscopy however he states he would like to wait several days in between procedures.  I discussed with him that given his kidney disease I would rather not do them close together but wait 4-8 weeks between procedures if he does not want to perform them on the same day given it is not urgent.   Elevated liver enzymes Mildly elevated ALT recently, possibly due to medication effects. Differential includes MASLD as well, most likely. Other causes possible as well - hepatitis, etc. but less likely.  No immediate concern but requires monitoring. - Will recheck liver enzymes  in three months. - Will perform  additional workup if liver enzymes remain elevated.      Procedure orders: Proceed with upper endoscopy and colonoscopy with propofol  by Dr. Shaaron in near future: the risks, benefits, and alternatives have been discussed with the patient in detail. The patient states understanding and desires to proceed. ASA 3 (ok room 1/2)  Hold Mounjaro for 1 week.  Follow up   Follow up 3-4 months.     Charmaine Melia, MSN, FNP-BC, AGACNP-BC [facility]

## 2023-12-31 NOTE — H&P (View-Only) (Signed)
 GI Office Note    Referring Provider: Marvine Rush, MD Primary Care Physician:  Marvine Rush, MD Primary Gastroenterologist: Lamar HERO.Rourk, MD  Date:  12/31/2023  ID:  Gabriel Schultz, DOB [birth year], MRN [id number]   Chief Complaint   Chief Complaint  Patient presents with   Rectal Bleeding    Had some rectal bleeding. Went to ED was told it was a internal hemorrhoid. Has not seen any bleeding since     History of Present Illness  Gabriel Schultz is a 73 y.o. male with a history of CAD with MI in 2003, HTN, HLD, GERD, diabetes, CKD, and gout presenting today with complaint of hematochezia.  Colonoscopy September 2020: - Sigmoid and descending diverticulosis - Six 5-11 mm adenomatous polyps in the descending and ascending colon with clip placement - Repeat 2025 recommended  EGD April 2021: - Incomplete Schatzki's ring s/p dilation - 1 cm island of salmon-colored mucosa with biopsy confirmed Barrett's - Multiple benign gastric polyps - Duodenal web s/p dilation - Recommend repeat in 2024.  OV 08/25/2019. Follow up of GERD and Dysphagia and recently diagnosed Barrett's. Denied any issues, had recent cholecystectomy  and was taking protonix  40 mg BID. Advised to go to daily PPI in 1 month but may increase to twice daily if worsening GERD symptoms.   Last office visit in August 2023.  Started having constipation a couple weeks prior, had been following a new diet for his blood sugars.  States he will have the urge to go but when he gets there is not usually able to go, usually have been going more frequently and looser stools given gallbladder removal.  Sometimes having Bristol 1 stools or like a pencil.  Other times will have large amounts of stool.  Denied any abdominal pain, melena, hematochezia.  He has had intentional weight loss.  Had recently started taking Metamucil and seeing some improvement and drinking 2 actually bottles of water  daily.  Having sinus drainage and having  to clear his throat frequently and taking Mucinex  as needed.  Denied any dysphagia or belching, chest pain, or shortness of breath.  Recommended Benefiber supplementation 2-3 teaspoons daily and continue pantoprazole  40 mg but reduce to once daily.  Advise follow-up in 1 year or sooner if constipation not relieved.  Multiple letters including colonoscopy questionnaire sent to patient in 2024 and earlier this year in 2025 and he has not called the office to schedule procedures.  ED visit 12/17/2023 with reports of hematochezia for 6 days.  Having blood with each bowel movement and prior to that noted some constipation and was taking laxatives.  He denied any abdominal pain, nausea, vomiting, diarrhea, dizziness, lightheadedness, syncope, chest pain, or shortness of breath.  EDP exam notes small external nonbleeding hemorrhoids and guaiac positive stool.  No overt bleeding was identified on examination.  No anemia with H&H stable  Labs 12/17/2023: Hemoglobin 14.4 (previously 11.1), creatinine 2.82, ALT 47 (H), AST 37, normal T. bili.  INR 1.  UA with glycosuria, negative for UTI.  Today: Discussed the use of AI scribe software for clinical note transcription with the patient, who gave verbal consent to proceed.  He experienced rectal bleeding following difficulty with bowel movements, requiring straining. The bleeding was characterized by dark red drops in the toilet after bowel movements and persisted for a few days before he sought medical attention. Blood present on toilet tissue as well. He continued taking Metamucil once daily, which improved his symptoms, and the bleeding resolved  and has had nothing within a week and a half.   His bowel movements were initially hard, requiring straining, but after starting Metamucil, they became softer. He describes his stools as 'water  and pellets' initially or 'soft, formed bowel movements' now with some more mushy stools.  No abdominal pain, nausea, vomiting, or  changes in appetite. He has a history of diverticulosis and colon polyps. Denies chest pain, dyspnea, lightheadedness, syncope.   He has a history of Barrett's esophagus and had an upper endoscopy in 2021. He recently changed his diet to manage blood sugar, resulting in some weight loss. He is on Mounjaro for diabetes management, taken weekly, and has adjusted his diet to include more salads and fewer sugary foods. His blood sugar levels are generally well-controlled, with morning readings around 130-140 mg/dL and lower during the day.  No recent alcohol or tobacco use. A recent ultrasound indicated a new hernia in the right groin area, which started a couple of months ago but denies significant pain currently.      Wt Readings from Last 6 Encounters:  12/31/23 225 lb 6.4 oz (102.2 kg)  12/17/23 225 lb 12.8 oz (102.4 kg)  12/06/23 225 lb 12.8 oz (102.4 kg)  12/05/23 228 lb 6.4 oz (103.6 kg)  03/15/23 251 lb 12.8 oz (114.2 kg)  02/11/23 246 lb 6.4 oz (111.8 kg)    Body mass index is 29.74 kg/m.   Current Outpatient Medications  Medication Sig Dispense Refill   allopurinol (ZYLOPRIM) 100 MG tablet Take 100 mg by mouth daily. In the evening     ALPRAZolam (XANAX) 0.5 MG tablet Take 0.5 mg by mouth 3 (three) times daily as needed.     amLODipine  (NORVASC ) 10 MG tablet Take 10 mg by mouth every evening.      aspirin EC 81 MG tablet Take 81 mg by mouth every evening.      Blood Glucose Monitoring Suppl (ONETOUCH VERIO FLEX SYSTEM) w/Device KIT by Does not apply route.     Blood Glucose Monitoring Suppl (ONETOUCH VERIO) w/Device KIT 1 each by Does not apply route in the morning, at noon, in the evening, and at bedtime. 1 kit 0   fluticasone (FLONASE) 50 MCG/ACT nasal spray Place 1-2 sprays into both nostrils 2 (two) times daily as needed for allergies or rhinitis.      glucose blood (ONETOUCH VERIO) test strip Use as instructed to check blood sugar four times daily. 300 each 3   glucose blood  test strip 1 each by Other route as needed for other. Use as instructed     Lancets (ONETOUCH ULTRASOFT) lancets Use as instructed to check blood sugars four times daily. 100 each 12   losartan (COZAAR) 50 MG tablet Take 50 mg by mouth daily.     metoprolol  succinate (TOPROL -XL) 50 MG 24 hr tablet Take 50 mg by mouth daily.     REPATHA  SURECLICK 140 MG/ML SOAJ INJECT 140 MG INTO THE SKIN EVERY 14 DAYS 2 mL 5   sodium bicarbonate 650 MG tablet Take 650 mg by mouth 2 (two) times daily.     sodium chloride  (OCEAN) 0.65 % SOLN nasal spray Place 2 sprays into both nostrils 3 (three) times daily as needed for congestion.     tirzepatide (MOUNJARO) 5 MG/0.5ML Pen Inject 5 mg into the skin once a week. 6 mL 1   oxyCODONE  (ROXICODONE ) 5 MG immediate release tablet Take 1 tablet (5 mg total) by mouth every 4 (four) hours as needed  for severe pain or breakthrough pain. (Patient not taking: Reported on 12/31/2023) 4 tablet 0   pantoprazole  (PROTONIX ) 40 MG tablet Take 1 tablet (40 mg total) by mouth daily. (Patient taking differently: Take 40 mg by mouth daily as needed.) 30 tablet 11   No current facility-administered medications for this visit.    Past Medical History:  Diagnosis Date   Arthritis    Coronary artery disease    Diabetes mellitus without complication (HCC)    GERD (gastroesophageal reflux disease)    Gout    High cholesterol    Hypertension    Myocardial infarction [facility])    2003   Renal insufficiency     Past Surgical History:  Procedure Laterality Date   BIOPSY  05/21/2019   Procedure: BIOPSY;  Surgeon: Shaaron Lamar HERO, MD;  Location: [facility];  Service: Endoscopy;;   CHOLECYSTECTOMY N/A 07/31/2019   Procedure: LAPAROSCOPIC CHOLECYSTECTOMY;  Surgeon: Kallie Manuelita BROCKS, MD;  Location: [facility];  Service: General;  Laterality: N/A;   COLONOSCOPY N/A 11/04/2018   Sigmoid and descending colon diverticulosis, six 5-11 mm polyps in descending and ascending colon, s/p clips. Tubular  adenomas. 5 year surveillance   CORONARY STENT PLACEMENT     ESOPHAGOGASTRODUODENOSCOPY (EGD) WITH PROPOFOL  N/A 05/21/2019    mild incomplete Schatzki ring s/p dilation, duodenal web s/p dilation, s/p esophageal biopsy and gastric biopsy. +Barrett's, negative H.pylori. 3 year surveillance   HERNIA REPAIR Left    inguinal   MALONEY DILATION N/A 05/21/2019   Procedure: AGAPITO DILATION;  Surgeon: Shaaron Lamar HERO, MD;  Location: [facility];  Service: Endoscopy;  Laterality: N/A;   POLYPECTOMY  11/04/2018   Procedure: POLYPECTOMY;  Surgeon: Shaaron Lamar HERO, MD;  Location: [facility];  Service: Endoscopy;;   UMBILICAL HERNIA REPAIR  07/31/2019   Procedure: HERNIA REPAIR UMBILICAL ADULT;  Surgeon: Kallie Manuelita BROCKS, MD;  Location: [facility];  Service: General;;    Family History  Problem Relation Age of Onset   Diabetes Mother    Diabetes Father     Allergies as of 12/31/2023 - Review Complete 12/31/2023  Allergen Reaction Noted   Pravastatin  Other (See Comments) 11/19/2022    Social History   Socioeconomic History   Marital status: Married    Spouse name: Not on file   Number of children: Not on file   Years of education: Not on file   Highest education level: Not on file  Occupational History   Not on file  Tobacco Use   Smoking status: Former    Current packs/day: 0.00    Average packs/day: 1 pack/day for 30.0 years (30.0 ttl pk-yrs)    Types: Cigarettes    Start date: 02/19/1958    Quit date: 02/20/1988    Years since quitting: 35.8   Smokeless tobacco: Former    Types: Chew    Quit date: 03/18/2016  Vaping Use   Vaping status: Never Used  Substance and Sexual Activity   Alcohol use: Not Currently    Comment: occasionally   Drug use: No   Sexual activity: Yes  Other Topics Concern   Not on file  Social History Narrative   Not on file   Social Drivers of Health   Financial Resource Strain: Not on file  Food Insecurity: Not on file  Transportation Needs: Not on file   Physical Activity: Not on file  Stress: Not on file  Social Connections: Not on file    Review of Systems   Gen:  Denies fever, chills, anorexia. Denies fatigue, weakness, weight loss.  CV: Denies chest pain, palpitations, syncope, peripheral edema, and claudication. Resp: Denies dyspnea at rest, cough, wheezing, coughing up blood, and pleurisy. GI: See HPI Derm: Denies rash, itching, dry skin Psych: Denies depression, anxiety, memory loss, confusion. No homicidal or suicidal ideation.  Heme: Denies bruising, bleeding, and enlarged lymph nodes.  Physical Exam   BP 138/76 (BP Location: Right Arm, Patient Position: Sitting, Cuff Size: Large)   Pulse 76   Temp 97.9 F (36.6 C) (Temporal)   Ht 6' 1 (1.854 m)   Wt 225 lb 6.4 oz (102.2 kg)   BMI 29.74 kg/m   General:   Alert and oriented. No distress noted. Pleasant and cooperative.  Head:  Normocephalic and atraumatic. Eyes:  Conjuctiva clear without scleral icterus. Mouth:  Oral mucosa pink and moist. Good dentition. No lesions. Lungs:  Clear to auscultation bilaterally. No wheezes, rales, or rhonchi. No distress.  Heart:  S1, S2 present without murmurs appreciated.  Abdomen:  +BS, soft, non-tender and non-distended. No rebound or guarding. No HSM or masses noted. Rectal: deferred Msk:  Symmetrical without gross deformities. Normal posture. Extremities:  Without edema. Neurologic:  Alert and  oriented x4 Psych:  Alert and cooperative. Normal mood and affect.  Assessment & Plan  Gabriel Schultz is a 73 y.o. male presenting today with for further evaluation of hematochezia  Rectal bleeding and constipation Intermittent rectal bleeding with constipation, likely due to hemorrhoids exacerbated by hard stools and straining. Differential includes diverticulosis and colon polyps, malignancy possible but less likely. No abdominal pain, nausea, vomiting, or reflux. Bright red blood on wiping, dark red drops in toilet. Could have been  mild mucosal tear as well given straining. ED visit for this with rectal exam reporting external hemorrhoids although patient reports he was told possible internal hemorrhoids. No recent use of hemorrhoid cream for this episode. Constipation improved with Metamucil. No significant changes in bowel habits prior to this episode. Mounjaro may contribute to constipation. - Continue Metamucil daily for bowel regulation. - Add Miralax if constipation worsens or stools become hard. - Scheduled colonoscopy to evaluate rectal bleeding and rule out other causes. - Educated on avoiding spicy foods that may exacerbate symptoms. - Consider using hemorrhoid cream if rectal bleeding reoccurs.   Surveillance for personal history of colon polyps Due for colonoscopy as last performed in 2020 with multiple tubular adenomas. Colonoscopy recommended to evaluate rectal bleeding and ensure no new polyps have developed. - Scheduled colonoscopy as below.   Barrett's esophagus surveillance and GERD Barrett's esophagus with last upper endoscopy in 2021. No current symptoms of GERD or reflux. Upper endoscopy recommended to monitor for progression and rule out esophageal cancer. Currently using pantoprazole  as needed and not daily. No dysphagia, nausea, or vomiting. No ear;u satiety. - Scheduled upper endoscopy after colonoscopy. Discussed performing at time of colonoscopy however he states he would like to wait several days in between procedures.  I discussed with him that given his kidney disease I would rather not do them close together but wait 4-8 weeks between procedures if he does not want to perform them on the same day given it is not urgent.   Elevated liver enzymes Mildly elevated ALT recently, possibly due to medication effects. Differential includes MASLD as well, most likely. Other causes possible as well - hepatitis, etc. but less likely.  No immediate concern but requires monitoring. - Will recheck liver enzymes  in three months. - Will perform  additional workup if liver enzymes remain elevated.      Procedure orders: Proceed with upper endoscopy and colonoscopy with propofol  by Dr. Shaaron in near future: the risks, benefits, and alternatives have been discussed with the patient in detail. The patient states understanding and desires to proceed. ASA 3 (ok room 1/2)  Hold Mounjaro for 1 week.  Follow up   Follow up 3-4 months.     Charmaine Melia, MSN, FNP-BC, AGACNP-BC [facility]

## 2023-12-31 NOTE — Patient Instructions (Addendum)
 We will get you scheduled for Colonoscopy in the near future with Dr. Shaaron.    We will get you scheduled for upper endoscopy also in the near future Dr. Shaaron separate from your colonoscopy and we can wait 4-8 weeks between procedures.  Continue pantoprazole  40 mg as needed for reflux symptoms.  For your mild constipation I want you to continue taking Metamucil at least 1 tablespoon daily and if you noticed stools are becoming less frequent or you are straining more than I want you to start taking MiraLAX once daily as well.  I will see you for follow-up after both of your procedures to see how you are doing in regards to your constipation.  It was a pleasure to see you today. I want to create trusting relationships with patients. If you receive a survey regarding your visit,  I greatly appreciate you taking time to fill this out on paper or through your MyChart. I value your feedback.  Charmaine Melia, MSN, FNP-BC, AGACNP-BC [facility]

## 2024-01-23 ENCOUNTER — Other Ambulatory Visit: Payer: Self-pay

## 2024-01-23 ENCOUNTER — Ambulatory Visit (HOSPITAL_COMMUNITY)

## 2024-01-23 ENCOUNTER — Ambulatory Visit (HOSPITAL_COMMUNITY)
Admission: RE | Admit: 2024-01-23 | Discharge: 2024-01-23 | Disposition: A | Discharge status: Home / Self Care | Attending: Internal Medicine | Admitting: Internal Medicine

## 2024-01-23 ENCOUNTER — Encounter (HOSPITAL_COMMUNITY): Payer: Self-pay | Admitting: Internal Medicine

## 2024-01-23 ENCOUNTER — Encounter (HOSPITAL_COMMUNITY)
Admission: RE | Disposition: A | Discharge status: Home / Self Care | Payer: Self-pay | Source: Home / Self Care | Attending: Internal Medicine

## 2024-01-23 DIAGNOSIS — K635 Polyp of colon: Secondary | ICD-10-CM

## 2024-01-23 DIAGNOSIS — Z87891 Personal history of nicotine dependence: Secondary | ICD-10-CM

## 2024-01-23 DIAGNOSIS — D122 Benign neoplasm of ascending colon: Secondary | ICD-10-CM | POA: Diagnosis not present

## 2024-01-23 DIAGNOSIS — Z1211 Encounter for screening for malignant neoplasm of colon: Secondary | ICD-10-CM

## 2024-01-23 DIAGNOSIS — D124 Benign neoplasm of descending colon: Secondary | ICD-10-CM | POA: Diagnosis not present

## 2024-01-23 DIAGNOSIS — D123 Benign neoplasm of transverse colon: Secondary | ICD-10-CM | POA: Diagnosis not present

## 2024-01-23 DIAGNOSIS — K5730 Diverticulosis of large intestine without perforation or abscess without bleeding: Secondary | ICD-10-CM

## 2024-01-23 DIAGNOSIS — I25118 Atherosclerotic heart disease of native coronary artery with other forms of angina pectoris: Secondary | ICD-10-CM

## 2024-01-23 HISTORY — PX: COLONOSCOPY: SHX5424

## 2024-01-23 HISTORY — PX: SUBMUCOSAL INJECTION: SHX5543

## 2024-01-23 HISTORY — PX: POLYPECTOMY: SHX149

## 2024-01-23 LAB — GLUCOSE, CAPILLARY: Glucose-Capillary: 107 mg/dL — ABNORMAL HIGH (ref 70–99)

## 2024-01-23 SURGERY — COLONOSCOPY
Anesthesia: General

## 2024-01-23 MED ORDER — SPOT INK MARKER SYRINGE KIT
PACK | SUBMUCOSAL | Status: DC | PRN
  Administered 2024-01-23: .5 mL via SUBMUCOSAL

## 2024-01-23 MED ORDER — PROPOFOL 10 MG/ML IV BOLUS
INTRAVENOUS | Status: DC | PRN
  Administered 2024-01-23: 20 mg via INTRAVENOUS
  Administered 2024-01-23: 125 ug/kg/min via INTRAVENOUS
  Administered 2024-01-23: 30 mg via INTRAVENOUS
  Administered 2024-01-23: 80 mg via INTRAVENOUS

## 2024-01-23 MED ORDER — GLUCAGON HCL RDNA (DIAGNOSTIC) 1 MG IJ SOLR
INTRAMUSCULAR | Status: DC | PRN
  Administered 2024-01-23: 1 mg via INTRAVENOUS

## 2024-01-23 MED ORDER — LACTATED RINGERS IV SOLN: INTRAVENOUS | Status: DC | PRN

## 2024-01-23 MED ORDER — LIDOCAINE 2% (20 MG/ML) 5 ML SYRINGE
INTRAMUSCULAR | Status: DC | PRN
  Administered 2024-01-23: 60 mg via INTRAVENOUS

## 2024-01-23 NOTE — Interval H&P Note (Signed)
 History and Physical Interval Note:  01/23/2024 12:24 PM  Gabriel Schultz  has presented today for surgery, with the diagnosis of HX COLON POLYPS.  The various methods of treatment have been discussed with the patient and family. After consideration of risks, benefits and other options for treatment, the patient has consented to  Procedure(s) with comments: COLONOSCOPY (N/A) - 11:15am, ok rm 1 as a surgical intervention.  The patient's history has been reviewed, patient examined, no change in status, stable for surgery.  I have reviewed the patient's chart and labs.  Questions were answered to the patient's satisfaction.     [name]  No change.  Patient is here for further evaluation of rectal bleeding.  History of colonic polyps.  The risks, benefits, limitations, alternatives and imponderables have been reviewed with the patient. Questions have been answered. All parties are agreeable.

## 2024-01-23 NOTE — Anesthesia Postprocedure Evaluation (Signed)
 Anesthesia Post Note  Patient: Gabriel Schultz  Procedure(s) Performed: COLONOSCOPY POLYPECTOMY, INTESTINE INJECTION, SUBMUCOSAL  Patient location during evaluation: PACU Anesthesia Type: General Level of consciousness: awake and alert Pain management: pain level controlled Vital Signs Assessment: post-procedure vital signs reviewed and stable Respiratory status: spontaneous breathing, nonlabored ventilation, respiratory function stable and patient connected to nasal cannula oxygen  Cardiovascular status: blood pressure returned to baseline and stable Postop Assessment: no apparent nausea or vomiting Anesthetic complications: no   No notable events documented.   Last Vitals:  Vitals:   01/23/24 1330 01/23/24 1338  BP: (!) 106/59 111/72  Pulse: 73 61  Resp: 15 18  Temp:    SpO2: 97% 98%    Last Pain:  Vitals:   01/23/24 1338  TempSrc:   PainSc: 0-No pain                 Andrea Limes

## 2024-01-23 NOTE — Op Note (Signed)
 [facility] Patient Name: Gabriel Schultz Procedure Date: 01/23/2024 11:37 AM MRN: [id number] Date of Birth: 27-Oct-1950 Attending MD: Lamar Ozell Hollingshead , MD, [id number] CSN: [id number] Age: 73 Admit Type: Outpatient Procedure:                Colonoscopy Indications:              Hematochezia Providers:                Lamar Ozell Hollingshead, MD, Devere Lodge, Bascom Blush Referring MD:              Medicines:                Propofol  per Anesthesia Complications:            No immediate complications. Estimated Blood Loss:     Estimated blood loss was minimal. Procedure:                Pre-Anesthesia Assessment:                           - Prior to the procedure, a History and Physical                            was performed, and patient medications and                            allergies were reviewed. The patient's tolerance of                            previous anesthesia was also reviewed. The risks                            and benefits of the procedure and the sedation                            options and risks were discussed with the patient.                            All questions were answered, and informed consent                            was obtained. Prior Anticoagulants: The patient has                            taken no anticoagulant or antiplatelet agents. ASA                            Grade Assessment: III - A patient with severe                            systemic disease. After reviewing the risks and                            benefits, the patient was deemed in satisfactory  condition to undergo the procedure.                           After obtaining informed consent, the colonoscope                            was passed under direct vision. Throughout the                            procedure, the patient's blood pressure, pulse, and                            oxygen  saturations were monitored continuously. The                             [id number] ([id number]) Colon was introduced through                            the anus and advanced to the the cecum, identified                            by appendiceal orifice and ileocecal valve. The                            colonoscopy was performed without difficulty. The                            patient tolerated the procedure well. The quality                            of the bowel preparation was adequate. The entire                            colon was well visualized. Scope In: 12:38:57 PM Scope Out: 1:11:47 PM Scope Withdrawal Time: 0 hours 29 minutes 9 seconds  Total Procedure Duration: 0 hours 32 minutes 50 seconds  Findings:      The perianal and digital rectal examinations were normal.      Scattered medium-mouthed diverticula were found in the sigmoid colon and       descending colon.      Eight semi-pedunculated polyps were found in the recto-sigmoid colon,       sigmoid colon and ascending colon. The polyps were 4 to 7 mm in size.       These polyps were removed with a cold snare. Resection and retrieval       were complete. Estimated blood loss was minimal. Estimated blood loss       was minimal.      A 9 mm polyp was found in the splenic flexure. The polyp was       semi-pedunculated. The polyp was removed with a hot snare. Resection and       retrieval were complete. Estimated blood loss: none.      An 18 mm polyp was found in the descending colon. The polyp was       pedunculated. The polyp was removed with a hot snare. Resection and  retrieval were complete. Estimated blood loss: none. Ink mark placed       just distal to the site of this polypectomy Impression:               - Diverticulosis in the sigmoid colon and in the                            descending colon.                           - Eight 4 to 7 mm polyps at the recto-sigmoid                            colon, in the sigmoid colon and in the ascending                             colon, removed with a cold snare. Resected and                            retrieved.                           - One 9 mm polyp at the splenic flexure, removed                            with a hot snare. Resected and retrieved.                           - One 18 mm polyp in the descending colon, removed                            with a hot snare. Resected and retrieved. Moderate Sedation:      Moderate (conscious) sedation was personally administered by an       anesthesia professional. The following parameters were monitored: oxygen        saturation, heart rate, blood pressure, respiratory rate, EKG, adequacy       of pulmonary ventilation, and response to care. Recommendation:           - Patient has a contact number available for                            emergencies. The signs and symptoms of potential                            delayed complications were discussed with the                            patient. Return to normal activities tomorrow.                            Written discharge instructions were provided to the                            patient.                           -  Advance diet as tolerated.                           - Continue present medications.                           - Repeat colonoscopy date to be determined after                            pending pathology results are reviewed for                            surveillance.                           - Return to GI office (date not yet determined). Procedure Code(s):        --- Professional ---                           [phone], Colonoscopy, flexible; with removal of                            tumor(s), polyp(s), or other lesion(s) by snare                            technique Diagnosis Code(s):        --- Professional ---                           D12.7, Benign neoplasm of rectosigmoid junction                           D12.5, Benign neoplasm of sigmoid colon                           D12.2, Benign  neoplasm of ascending colon                           D12.3, Benign neoplasm of transverse colon (hepatic                            flexure or splenic flexure)                           D12.4, Benign neoplasm of descending colon                           K92.1, Melena (includes Hematochezia)                           K57.30, Diverticulosis of large intestine without                            perforation or abscess without bleeding CPT copyright 2022 American [employer]. All rights reserved. The codes documented in this report are preliminary and upon coder review may  be revised to meet current compliance requirements. Lamar  EMERSON Hollingshead, MD Lamar Ozell Hollingshead, MD 01/23/2024 1:27:26 PM This report has been signed electronically. Number of Addenda: 0

## 2024-01-23 NOTE — Anesthesia Preprocedure Evaluation (Signed)
 Anesthesia Evaluation  Patient identified by MRN, date of birth, ID band Patient awake    Reviewed: Allergy & Precautions, H&P , NPO status , Patient's Chart, lab work & pertinent test results  Airway Mallampati: II  TM Distance: >3 FB Neck ROM: Full    Dental no notable dental hx.    Pulmonary neg pulmonary ROS, former smoker   Pulmonary exam normal breath sounds clear to auscultation       Cardiovascular hypertension, + angina  + CAD and + Past MI  Normal cardiovascular exam Rhythm:Regular Rate:Normal     Neuro/Psych negative neurological ROS  negative psych ROS   GI/Hepatic Neg liver ROS,GERD  ,,  Endo/Other  diabetes    Renal/GU Renal disease  negative genitourinary   Musculoskeletal  (+) Arthritis ,    Abdominal   Peds negative pediatric ROS (+)  Hematology negative hematology ROS (+)   Anesthesia Other Findings   Reproductive/Obstetrics negative OB ROS                              Anesthesia Physical Anesthesia Plan  ASA: 3  Anesthesia Plan: General   Post-op Pain Management:    Induction: Intravenous  PONV Risk Score and Plan:   Airway Management Planned: Nasal Cannula  Additional Equipment:   Intra-op Plan:   Post-operative Plan:   Informed Consent: I have reviewed the patients History and Physical, chart, labs and discussed the procedure including the risks, benefits and alternatives for the proposed anesthesia with the patient or authorized representative who has indicated his/her understanding and acceptance.     Dental advisory given  Plan Discussed with: CRNA  Anesthesia Plan Comments:         Anesthesia Quick Evaluation

## 2024-01-23 NOTE — Discharge Instructions (Signed)
  Colonoscopy Discharge Instructions  Read the instructions outlined below and refer to this sheet in the next few weeks. These discharge instructions provide you with general information on caring for yourself after you leave the hospital. Your doctor may also give you specific instructions. While your treatment has been planned according to the most current medical practices available, unavoidable complications occasionally occur. If you have any problems or questions after discharge, call Dr. Shaaron at [phone]. ACTIVITY You may resume your regular activity, but move at a slower pace for the next 24 hours.  Take frequent rest periods for the next 24 hours.  Walking will help get rid of the air and reduce the bloated feeling in your belly (abdomen).  No driving for 24 hours (because of the medicine (anesthesia) used during the test).   Do not sign any important legal documents or operate any machinery for 24 hours (because of the anesthesia used during the test).  NUTRITION Drink plenty of fluids.  You may resume your normal diet as instructed by your doctor.  Begin with a light meal and progress to your normal diet. Heavy or fried foods are harder to digest and may make you feel sick to your stomach (nauseated).  Avoid alcoholic beverages for 24 hours or as instructed.  MEDICATIONS You may resume your normal medications unless your doctor tells you otherwise.  WHAT YOU CAN EXPECT TODAY Some feelings of bloating in the abdomen.  Passage of more gas than usual.  Spotting of blood in your stool or on the toilet paper.  IF YOU HAD POLYPS REMOVED DURING THE COLONOSCOPY: No aspirin products for 7 days or as instructed.  No alcohol for 7 days or as instructed.  Eat a soft diet for the next 24 hours.  FINDING OUT THE RESULTS OF YOUR TEST Not all test results are available during your visit. If your test results are not back during the visit, make an appointment with your caregiver to find out the  results. Do not assume everything is normal if you have not heard from your caregiver or the medical facility. It is important for you to follow up on all of your test results.  SEEK IMMEDIATE MEDICAL ATTENTION IF: You have more than a spotting of blood in your stool.  Your belly is swollen (abdominal distention).  You are nauseated or vomiting.  You have a temperature over 101.  You have abdominal pain or discomfort that is severe or gets worse throughout the day.  -  10 polyps found and removed today 1 large 1 on the left side of your colon likely caused the bleeding  You also have diverticulosis.  Further recommendations to follow pending review of pathology report

## 2024-01-23 NOTE — Transfer of Care (Signed)
 Immediate Anesthesia Transfer of Care Note  Patient: Gabriel Schultz  Procedure(s) Performed: COLONOSCOPY POLYPECTOMY, INTESTINE INJECTION, SUBMUCOSAL  Patient Location: Endoscopy Unit  Anesthesia Type:General  Level of Consciousness: drowsy and patient cooperative  Airway & Oxygen  Therapy: Patient Spontanous Breathing  Post-op Assessment: Report given to RN and Post -op Vital signs reviewed and stable  Post vital signs: Reviewed and stable  Last Vitals:  Vitals Value Taken Time  BP 85/50 01/23/24 13:17  Temp 36.5 C 01/23/24 13:17  Pulse 67 01/23/24 13:17  Resp 15 01/23/24 13:17  SpO2 95 % 01/23/24 13:17    Last Pain:  Vitals:   01/23/24 1317  TempSrc: Oral  PainSc:       Patients Stated Pain Goal: 3 (01/23/24 0955)  Complications: No notable events documented.

## 2024-01-23 NOTE — Anesthesia Procedure Notes (Signed)
 Date/Time: 01/23/2024 12:34 PM  Performed by: Para Jerelene CROME, CRNAOxygen Delivery Method: Nasal cannula

## 2024-01-24 ENCOUNTER — Encounter (HOSPITAL_COMMUNITY): Payer: Self-pay | Admitting: Internal Medicine

## 2024-01-24 LAB — SURGICAL PATHOLOGY

## 2024-01-26 ENCOUNTER — Ambulatory Visit: Payer: Self-pay | Admitting: Internal Medicine

## 2024-02-05 ENCOUNTER — Ambulatory Visit: Payer: Self-pay | Admitting: Cardiology

## 2024-03-03 ENCOUNTER — Telehealth: Payer: Self-pay | Admitting: *Deleted

## 2024-03-03 NOTE — Telephone Encounter (Signed)
 Called home # was advised to call pt cell as he just left. Called and no answer and VM full Needs EGD with Dr. Shaaron, ASA 3 (ok rm 1), stop mounjaro  1 week prior

## 2024-03-06 NOTE — Telephone Encounter (Signed)
 Called pt, no answer and VM full. Will mail letter.

## 2024-04-14 ENCOUNTER — Ambulatory Visit: Admitting: Nurse Practitioner
